# Patient Record
Sex: Female | Born: 1965 | Race: White | Hispanic: No | Marital: Married | Smoking: Never smoker
Health system: Southern US, Community
[De-identification: ages and names within clinical notes are randomized; demographics above are authoritative.]

## PROBLEM LIST (undated history)

## (undated) DIAGNOSIS — R911 Solitary pulmonary nodule: Secondary | ICD-10-CM

## (undated) DIAGNOSIS — R002 Palpitations: Secondary | ICD-10-CM

## (undated) DIAGNOSIS — F419 Anxiety disorder, unspecified: Secondary | ICD-10-CM

## (undated) DIAGNOSIS — R000 Tachycardia, unspecified: Secondary | ICD-10-CM

## (undated) DIAGNOSIS — N6019 Diffuse cystic mastopathy of unspecified breast: Secondary | ICD-10-CM

## (undated) DIAGNOSIS — R569 Unspecified convulsions: Secondary | ICD-10-CM

## (undated) DIAGNOSIS — R0602 Shortness of breath: Secondary | ICD-10-CM

## (undated) DIAGNOSIS — J302 Other seasonal allergic rhinitis: Secondary | ICD-10-CM

## (undated) DIAGNOSIS — M542 Cervicalgia: Secondary | ICD-10-CM

## (undated) DIAGNOSIS — M545 Low back pain, unspecified: Secondary | ICD-10-CM

## (undated) DIAGNOSIS — E785 Hyperlipidemia, unspecified: Secondary | ICD-10-CM

## (undated) DIAGNOSIS — K429 Umbilical hernia without obstruction or gangrene: Secondary | ICD-10-CM

## (undated) DIAGNOSIS — R109 Unspecified abdominal pain: Secondary | ICD-10-CM

## (undated) DIAGNOSIS — K219 Gastro-esophageal reflux disease without esophagitis: Secondary | ICD-10-CM

## (undated) DIAGNOSIS — K449 Diaphragmatic hernia without obstruction or gangrene: Secondary | ICD-10-CM

## (undated) DIAGNOSIS — R635 Abnormal weight gain: Secondary | ICD-10-CM

## (undated) DIAGNOSIS — R067 Sneezing: Secondary | ICD-10-CM

## (undated) DIAGNOSIS — D72819 Decreased white blood cell count, unspecified: Secondary | ICD-10-CM

## (undated) DIAGNOSIS — R1031 Right lower quadrant pain: Secondary | ICD-10-CM

## (undated) HISTORY — DX: Diaphragmatic hernia without obstruction or gangrene: K44.9

## (undated) HISTORY — DX: Anxiety disorder, unspecified: F41.9

## (undated) HISTORY — DX: Unspecified abdominal pain: R10.9

## (undated) HISTORY — DX: Umbilical hernia without obstruction or gangrene: K42.9

## (undated) HISTORY — PX: HYSTEROSCOPY DIAGNOSTIC: PRO49

## (undated) HISTORY — PX: COLONOSCOPY W/ POLYPECTOMY: SHX1380

## (undated) HISTORY — PX: BREAST SURGERY: SHX581

## (undated) HISTORY — DX: Shortness of breath: R06.02

## (undated) HISTORY — DX: Low back pain, unspecified: M54.50

## (undated) HISTORY — DX: Abnormal weight gain: R63.5

## (undated) HISTORY — DX: Other seasonal allergic rhinitis: J30.2

## (undated) HISTORY — PX: KNEE SURGERY: SHX244

## (undated) HISTORY — DX: Unspecified convulsions: R56.9

## (undated) HISTORY — DX: Solitary pulmonary nodule: R91.1

## (undated) HISTORY — PX: NECK SURGERY: SHX720

## (undated) HISTORY — DX: Cervicalgia: M54.2

## (undated) HISTORY — DX: Decreased white blood cell count, unspecified: D72.819

## (undated) HISTORY — DX: Sneezing: R06.7

## (undated) HISTORY — DX: Gastro-esophageal reflux disease without esophagitis: K21.9

## (undated) HISTORY — DX: Hyperlipidemia, unspecified: E78.5

## (undated) HISTORY — PX: JOINT REPLACEMENT: SHX530

## (undated) HISTORY — PX: BREAST BIOPSY: SHX20

## (undated) HISTORY — DX: Palpitations: R00.2

## (undated) HISTORY — PX: CHOLECYSTECTOMY: SHX55

## (undated) HISTORY — PX: ROBOTIC ASSISTED TOTAL HYSTERECTOMY: SHX6085

## (undated) HISTORY — PX: ABDOMINAL HYSTERECTOMY: SHX81

---
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
## 2005-07-16 ENCOUNTER — Ambulatory Visit (HOSPITAL_COMMUNITY): Admission: RE | Admit: 2005-07-16 | Discharge: 2005-07-17 | Payer: Self-pay | Admitting: Orthopaedic Surgery

## 2006-10-30 ENCOUNTER — Ambulatory Visit (HOSPITAL_COMMUNITY): Admission: RE | Admit: 2006-10-30 | Discharge: 2006-10-31 | Payer: Self-pay | Admitting: Orthopaedic Surgery

## 2019-03-31 ENCOUNTER — Telehealth: Payer: Self-pay | Admitting: *Deleted

## 2019-03-31 NOTE — Telephone Encounter (Signed)
    COVID-19 Pre-Screening Questions:  . In the past 7 to 10 days have you had a cough,  shortness of breath, headache, congestion, fever (100 or greater) body aches, chills, sore throat, or sudden loss of taste or sense of smell? . Have you been around anyone with known Covid 19. . Have you been around anyone who is awaiting Covid 19 test results in the past 7 to 10 days? . Have you been around anyone who has been exposed to Covid 19, or has mentioned symptoms of Covid 19 within the past 7 to 10 days?  Patient answered no to all the above questions and will call back if anything changes prior to her appointment. She is aware to arrive at 9:15 for a 9:30 appointment and was instructed to wear a mask.  She asked if her husband could join her for the appointment, I let her know unfortunately due to COVID that he would not be able to join her.  She understood and was appreciative of my call.

## 2019-04-07 ENCOUNTER — Encounter (INDEPENDENT_AMBULATORY_CARE_PROVIDER_SITE_OTHER): Payer: Self-pay

## 2019-04-07 ENCOUNTER — Other Ambulatory Visit: Payer: Self-pay

## 2019-04-07 ENCOUNTER — Ambulatory Visit (INDEPENDENT_AMBULATORY_CARE_PROVIDER_SITE_OTHER): Payer: Medicare Other | Admitting: Interventional Cardiology

## 2019-04-07 ENCOUNTER — Encounter: Payer: Self-pay | Admitting: Interventional Cardiology

## 2019-04-07 VITALS — BP 120/82 | HR 92 | Ht 65.0 in | Wt 163.8 lb

## 2019-04-07 DIAGNOSIS — R5383 Other fatigue: Secondary | ICD-10-CM

## 2019-04-07 DIAGNOSIS — R002 Palpitations: Secondary | ICD-10-CM

## 2019-04-07 DIAGNOSIS — R0602 Shortness of breath: Secondary | ICD-10-CM | POA: Diagnosis not present

## 2019-04-07 MED ORDER — METOPROLOL SUCCINATE ER 25 MG PO TB24: 25.0000 mg | ORAL_TABLET | Freq: Every day | ORAL | 3 refills | Status: DC

## 2019-04-07 NOTE — Progress Notes (Addendum)
Cardiology Office Note   Date:  04/07/2019   ID:  Diane Gutierrez, DOB October 22, 1966, MRN [id number]  PCP:  Osie Cheeks, PA-C    No chief complaint on file.  palpitations  Wt Readings from Last 3 Encounters:  04/07/19 163 lb 12.8 oz (74.3 kg)       History of Present Illness: Diane Gutierrez is a 53 y.o. female who is being seen today for the evaluation of palpitaitons at the request of No ref. provider found.  She had a hysterectomy in 2019, and developed palpitations after the surgery.  SHe had a monitor in 01/2018 showing only some Very rare PVCs.  Avg HR 88.   She was started on a beta blocker.    Echo in 2019 showed normal LV function.    She reports some orthopnea and dizziness when she sits up.   She has fatigue.  She has had chronic pain as well.   She admits to being "anxious and hyper."  She reports some occasional right leg swelling. She has had lymph nodes removed from her groins as well.   Brother died from an arrhythmia, in the setting of myocarditis presumably.  Both parents had heart attacks. Father with CABG in his 41s.       Past Medical History:  Diagnosis Date  . Abdominal pain   . Anxiety   . Hyperlipidemia   . Neck pain   . Palpitations   . Seasonal allergies   . Sneezing   . SOB (shortness of breath)   . Tachycardia   . Weight gain        Current Outpatient Medications  Medication Sig Dispense Refill  . acetaminophen (TYLENOL) 325 MG tablet Take 650 mg by mouth every 6 (six) hours as needed.    Marland Kitchen BIOTIN PO Take by mouth.    . Calcium Carb-Cholecalciferol (CALCIUM CARBONATE-VITAMIN D3 PO) Take by mouth.    . clonazePAM (KLONOPIN) 0.5 MG tablet Take 0.5 mg by mouth 2 (two) times daily as needed for anxiety.    . cyclobenzaprine (FLEXERIL) 10 MG tablet Take 10 mg by mouth daily.    . fexofenadine (ALLEGRA) 60 MG tablet Take 60 mg by mouth 2 (two) times daily.    Marland Kitchen gabapentin (NEURONTIN) 300 MG capsule Take 300 mg by mouth  daily.    Marland Kitchen KRILL OIL PO Take by mouth.    . Multiple Vitamin (MULTIVITAMIN) tablet Take 1 tablet by mouth daily.    Marland Kitchen omeprazole (PRILOSEC) 40 MG capsule Take 40 mg by mouth daily.    . promethazine (PHENERGAN) 12.5 MG tablet Take 12.5 mg by mouth every 6 (six) hours as needed for nausea or vomiting.    . metoprolol succinate (TOPROL-XL) 25 MG 24 hr tablet Take 1 tablet (25 mg total) by mouth daily. Take with or immediately following a meal. 90 tablet 3   No current facility-administered medications for this visit.     Allergies:   Ibuprofen and Prednisone    Social History:  The patient  reports that she has never smoked. She has never used smokeless tobacco.   Family History:  The patient's family history includes Heart disease in her father.    ROS:  Please see the history of present illness. Occasional right ankle swelling.  Otherwise, review of systems are positive for palpitations, fatigue.   All other systems are reviewed and negative.    PHYSICAL EXAM: VS:  BP 120/82   Pulse 92  Ht 5\' 5"  (1.651 m)   Wt 163 lb 12.8 oz (74.3 kg)   SpO2 98%   BMI 27.26 kg/m  , BMI Body mass index is 27.26 kg/m. GEN: Well nourished, well developed, in no acute distress  HEENT: normal  Neck: no JVD, carotid bruits, or masses Cardiac: RRR; no murmurs, rubs, or gallops,no edema  Respiratory:  clear to auscultation bilaterally, normal work of breathing GI: soft, nontender, nondistended, + BS MS: no deformity or atrophy  Skin: warm and dry, no rash Neuro:  Strength and sensation are intact Psych: euthymic mood, full affect   EKG:   The ekg ordered today demonstrates NSR , nonspecific ST changes   Recent Labs: No results found for requested labs within last 8760 hours.   Lipid Panel No results found for: CHOL, TRIG, HDL, CHOLHDL, VLDL, LDLCALC, LDLDIRECT   Other studies Reviewed: Additional studies/ records that were reviewed today with results demonstrating: echo and monitor  reviewed.   ASSESSMENT AND PLAN:  1. Palpitations: PVCs and intermittent sinus tach noted on prior monitor. No pathologic arrhythmias.  2. SHOB: Normal LV function and adequate valvular function by echo. 3. Decrease metoprolol to 25 mg daily.  Fatigue has been worse with the medicine.  May ultimately change to prn medicine.  Continue exercise.     Current medicines are reviewed at length with the patient today.  The patient concerns regarding her medicines were addressed.  The following changes have been made:  No change  Labs/ tests ordered today include:   Orders Placed This Encounter  Procedures  . EKG 12-Lead    Recommend 150 minutes/week of aerobic exercise Low fat, low carb, high fiber diet recommended  Disposition:   FU in 2 months with video visit.     Signed, Larae Grooms, MD  04/07/2019 5:20 PM    [facility] [place], [place], [place]  [address] Phone: [phone]; Fax: [phone]

## 2019-04-07 NOTE — Patient Instructions (Signed)
Medication Instructions:  Your physician has recommended you make the following change in your medication:   DECREASE: metoprolol succinate (Toprol-XL) to 25 mg once a day  Lab work: None Ordered  If you have labs (blood work) drawn today and your tests are completely normal, you will receive your results only by: Marland Kitchen MyChart Message (if you have MyChart) OR . A paper copy in the mail If you have any lab test that is abnormal or we need to change your treatment, we will call you to review the results.  Testing/Procedures: None ordered  Follow-Up: . Follow up with Dr. Irish Lack via [place] Visit in 2 months  Any Other Special Instructions Will Be Listed Below (If Applicable).

## 2019-04-08 ENCOUNTER — Telehealth: Payer: Self-pay

## 2019-04-08 NOTE — Telephone Encounter (Signed)
Called and made patient aware of recommendations. Patient wanting to let Dr. Irish Lack know that she read the OV note and saw that it was noted that her monitor was done in April 2020 but wanted to let him know that her monitor was from April 2019 and her echo was from May 2019.

## 2019-04-08 NOTE — Telephone Encounter (Signed)
Patient calling and states that her heart was racing earlier at 140 bpm on her fitbit. She states that when she is up walking around the house is when it happens. She states that she has some SOB with it. She started the lower dose of Toprol 25 mg this morning. She states that her HR is currently 128 on her fitbit. Instructed her to go ahead an take the other 25 mg. She denies caffeine, alcohol, or any diet pills. She states that she has been staying hydrated and has had plenty of water. Made patient aware that I will forward to Dr. Irish Lack for review and recommendation and I will call her back before end of day.

## 2019-04-08 NOTE — Telephone Encounter (Signed)
Agree with using the additional 25 mg of metoprolol prn.    Stick to metoprolol 25 mg daily scheduled as well, so max dose 50 mg in a day.

## 2019-04-13 NOTE — Telephone Encounter (Signed)
I corrected my note. Thanks.

## 2019-04-18 ENCOUNTER — Ambulatory Visit: Payer: Self-pay | Admitting: Cardiology

## 2019-06-15 ENCOUNTER — Telehealth: Payer: Self-pay | Admitting: Family Medicine

## 2019-06-15 NOTE — Telephone Encounter (Signed)
Pt is calling requesting new pt appt with Dr. Charlett Blake. She states that [employer] with [facility] hiring referred her to Dr. Charlett Blake and was sending a message to the practice administrator for her. Elias Else, a current pt with Dr. Charlett Blake, also referred her. Please advise.

## 2019-06-17 NOTE — Telephone Encounter (Signed)
Please schedule patient for a new patient appt

## 2019-06-17 NOTE — Telephone Encounter (Signed)
First available is 08/09/2019. She is requesting sooner as she has had kidney pain, stomach tension, weight gain, etc since having a hysterectomy last year. Pt states that she has a pinching feeling since then. Pt requesting appt sooner and can come any day or time if Dr. Charlett Blake will fit her in.

## 2019-06-17 NOTE — Telephone Encounter (Signed)
Yes am happy to see new patient

## 2019-06-17 NOTE — Telephone Encounter (Signed)
Please advise 

## 2019-06-21 NOTE — Progress Notes (Signed)
Virtual Visit via Video Note   This visit type was conducted due to national recommendations for restrictions regarding the COVID-19 Pandemic (e.g. social distancing) in an effort to limit this patient's exposure and mitigate transmission in our community.  Due to her co-morbid illnesses, this patient is at least at moderate risk for complications without adequate follow up.  This format is felt to be most appropriate for this patient at this time.  All issues noted in this document were discussed and addressed.  A limited physical exam was performed with this format.  Please refer to the patient's chart for her consent to telehealth for [facility].   Date:  06/22/2019   ID:  Diane Gutierrez, DOB Mar 05, 1966, MRN [id number]  Patient Location: Home Provider Location: Office  PCP:  Mosie Lukes, MD  Cardiologist:  No primary care provider on file. [place] Electrophysiologist:  None   Evaluation Performed:  Follow-Up Visit  Chief Complaint:  Palpitations  History of Present Illness:    Diane Gutierrez is a 53 y.o. female  who was seen in 03/2019 for the evaluation of palpitaitons at the request of Dorothy Puffer, PA-C.  Prior records showed: "She had a hysterectomy in 2019, and developed palpitations after the surgery.  SHe had a monitor in 01/2018 showing only some Very rare PVCs.  Avg HR 88.   She was started on a beta blocker.    Echo in 2019 showed normal LV function.    She reports some orthopnea and dizziness when she sits up.   She has fatigue.  She has had chronic pain as well.   She admits to being "anxious and hyper."  She reports some occasional right leg swelling. She has had lymph nodes removed from her groins as well.   Brother died from an arrhythmia, in the setting of myocarditis presumably.  Both parents had heart attacks. Father with CABG in his 44s."  Plan in MAy 2020: 1. "Palpitations: PVCs and intermittent sinus tach noted on prior monitor. No  pathologic arrhythmias.  2. SHOB: Normal LV function and adequate valvular function by echo. 3. Decrease metoprolol to 25 mg daily.  Fatigue has been worse with the medicine.  May ultimately change to prn medicine.  Continue exercise. Palpitations: PVCs and intermittent sinus tach noted on prior monitor. No pathologic arrhythmias.  4. SHOB: Normal LV function and adequate valvular function by echo. Decrease metoprolol to 25 mg daily.  Fatigue has been worse with the medicine.  May ultimately change to prn medicine.  Continue exercise. "  Since the last visit, she has done well.  She has some palpitations.  She tries to walk and use the elliptical.  HR increases quickly.    Denies :Dizziness. Leg edema. Nitroglycerin use. Orthopnea.  Paroxysmal nocturnal dyspnea. Shortness of breath. Syncope.   Metoprolol was decreased to once a day.  She feels that the palpitations are more frequent.     The patient does not have symptoms concerning for COVID-19 infection (fever, chills, cough, or new shortness of breath).    Past Medical History:  Diagnosis Date  . Abdominal pain   . Anxiety   . Hyperlipidemia   . Neck pain   . Palpitations   . Seasonal allergies   . Sneezing   . SOB (shortness of breath)   . Tachycardia   . Weight gain       Current Meds  Medication Sig  . acetaminophen (TYLENOL) 325 MG tablet Take 650 mg by  mouth every 6 (six) hours as needed.  Marland Kitchen BIOTIN PO Take by mouth.  . Calcium Carb-Cholecalciferol (CALCIUM CARBONATE-VITAMIN D3 PO) Take by mouth.  . clonazePAM (KLONOPIN) 0.5 MG tablet Take 0.5 mg by mouth 2 (two) times daily as needed for anxiety.  . cyclobenzaprine (FLEXERIL) 10 MG tablet Take 10 mg by mouth daily.  Marland Kitchen gabapentin (NEURONTIN) 300 MG capsule Take 300 mg by mouth daily.  . metoprolol succinate (TOPROL-XL) 25 MG 24 hr tablet Take 1 tablet (25 mg total) by mouth daily. Take with or immediately following a meal.  . Multiple Vitamin (MULTIVITAMIN) tablet Take  1 tablet by mouth daily.  . Omega-3 Fatty Acids (FISH OIL) 1000 MG CAPS Take 1,000 mg by mouth daily.  Marland Kitchen omeprazole (PRILOSEC) 40 MG capsule Take 40 mg by mouth daily.  . promethazine (PHENERGAN) 12.5 MG tablet Take 12.5 mg by mouth every 6 (six) hours as needed for nausea or vomiting.     Allergies:   Ibuprofen and Prednisone   Social History   Tobacco Use  . Smoking status: Never Smoker  . Smokeless tobacco: Never Used  Substance Use Topics  . Alcohol use: Not on file  . Drug use: Not on file     Family Hx: The patient's family history includes Heart disease in her father.  ROS:   Please see the history of present illness.    Continued palpitations All other systems reviewed and are negative.   Prior CV studies:   The following studies were reviewed today:  Normal EF by echo in 2019  Labs/Other Tests and Data Reviewed:    EKG:  An ECG dated 03/2019 was personally reviewed today and demonstrated:  NSR, normal ECG  Recent Labs: No results found for requested labs within last 8760 hours.   Recent Lipid Panel No results found for: CHOL, TRIG, HDL, CHOLHDL, LDLCALC, LDLDIRECT  Wt Readings from Last 3 Encounters:  06/22/19 164 lb (74.4 kg)  04/07/19 163 lb 12.8 oz (74.3 kg)     Objective:    Vital Signs:  BP 111/86   Pulse (!) 105   Ht 5\' 5"  (1.651 m)   Wt 164 lb (74.4 kg)   BMI 27.29 kg/m    VITAL SIGNS:  reviewed GEN:  no acute distress RESPIRATORY:  normal respiratory effort, symmetric expansion NEURO:  alert and oriented x 3, no obvious focal deficit PSYCH:  normal affect exam limited by video format  ASSESSMENT & PLAN:    1. Palpitations: PVC and intermittent sinus tach noted on prior monitor.  More palpitations, will increase metoprolol to 25 mg BID.  2. Fatigue:  Improved. 3. Chest pain: Some atypical chest pains, but not related to exertion.    COVID-19 Education: The signs and symptoms of COVID-19 were discussed with the patient and how to  seek care for testing (follow up with PCP or arrange E-visit).  The importance of social distancing was discussed today.  Time:   Today, I have spent 25 minutes with the patient with telehealth technology discussing the above problems.     Medication Adjustments/Labs and Tests Ordered: Current medicines are reviewed at length with the patient today.  Concerns regarding medicines are outlined above.   Tests Ordered: No orders of the defined types were placed in this encounter.   Medication Changes: No orders of the defined types were placed in this encounter.   Follow Up:  Virtual Visit or In Person in 3 month(s)  Signed, Larae Grooms, MD  06/22/2019 1:50  PM    [facility]

## 2019-06-22 ENCOUNTER — Encounter: Payer: Self-pay | Admitting: Interventional Cardiology

## 2019-06-22 ENCOUNTER — Other Ambulatory Visit: Payer: Self-pay

## 2019-06-22 ENCOUNTER — Telehealth (INDEPENDENT_AMBULATORY_CARE_PROVIDER_SITE_OTHER): Payer: Medicare Other | Admitting: Interventional Cardiology

## 2019-06-22 VITALS — BP 111/86 | HR 105 | Ht 65.0 in | Wt 164.0 lb

## 2019-06-22 DIAGNOSIS — R5383 Other fatigue: Secondary | ICD-10-CM

## 2019-06-22 DIAGNOSIS — I493 Ventricular premature depolarization: Secondary | ICD-10-CM | POA: Insufficient documentation

## 2019-06-22 DIAGNOSIS — R002 Palpitations: Secondary | ICD-10-CM | POA: Diagnosis not present

## 2019-06-22 HISTORY — DX: Ventricular premature depolarization: I49.3

## 2019-06-22 MED ORDER — METOPROLOL SUCCINATE ER 25 MG PO TB24: 25.0000 mg | ORAL_TABLET | Freq: Two times a day (BID) | ORAL | 3 refills | Status: DC

## 2019-06-22 NOTE — Patient Instructions (Signed)
Medication Instructions:  Your physician has recommended you make the following change in your medication:   INCREASE: metoprolol succinate (Toprol-XL) to 25 mg TWICE a day  Lab work: None Ordered  If you have labs (blood work) drawn today and your tests are completely normal, you will receive your results only by: Marland Kitchen MyChart Message (if you have MyChart) OR . A paper copy in the mail If you have any lab test that is abnormal or we need to change your treatment, we will call you to review the results.  Testing/Procedures: None ordered  Follow-Up: . Follow up with Dr. Irish Lack via [place] Visit in 3 months  Any Other Special Instructions Will Be Listed Below (If Applicable).

## 2019-06-29 ENCOUNTER — Telehealth: Payer: Self-pay

## 2019-06-29 NOTE — Telephone Encounter (Signed)
Copied from [place] [id number]. Topic: Appointment Scheduling - Scheduling Inquiry for Clinic >> Jun 14, 2019  2:02 PM Sheran Luz wrote: Patient states she was approved to see Dr. Charlett Blake by Enriqueta Shutter. Please contact patient for scheduling.

## 2019-07-01 NOTE — Telephone Encounter (Signed)
Patient scheduled for 07/07/19

## 2019-07-04 ENCOUNTER — Other Ambulatory Visit: Payer: Self-pay

## 2019-07-05 ENCOUNTER — Telehealth: Payer: Medicare Other | Admitting: Interventional Cardiology

## 2019-07-07 ENCOUNTER — Encounter: Payer: Self-pay | Admitting: Family Medicine

## 2019-07-07 ENCOUNTER — Other Ambulatory Visit: Payer: Self-pay

## 2019-07-07 ENCOUNTER — Ambulatory Visit (INDEPENDENT_AMBULATORY_CARE_PROVIDER_SITE_OTHER): Payer: Medicare Other | Admitting: Family Medicine

## 2019-07-07 ENCOUNTER — Telehealth: Payer: Self-pay | Admitting: Family Medicine

## 2019-07-07 VITALS — BP 108/72 | HR 104 | Temp 97.7°F | Resp 18 | Ht 65.0 in | Wt 165.0 lb

## 2019-07-07 DIAGNOSIS — R1011 Right upper quadrant pain: Secondary | ICD-10-CM

## 2019-07-07 DIAGNOSIS — E785 Hyperlipidemia, unspecified: Secondary | ICD-10-CM | POA: Diagnosis not present

## 2019-07-07 DIAGNOSIS — M545 Low back pain, unspecified: Secondary | ICD-10-CM

## 2019-07-07 DIAGNOSIS — F419 Anxiety disorder, unspecified: Secondary | ICD-10-CM | POA: Diagnosis not present

## 2019-07-07 DIAGNOSIS — R1031 Right lower quadrant pain: Secondary | ICD-10-CM

## 2019-07-07 DIAGNOSIS — K589 Irritable bowel syndrome without diarrhea: Secondary | ICD-10-CM | POA: Insufficient documentation

## 2019-07-07 DIAGNOSIS — R000 Tachycardia, unspecified: Secondary | ICD-10-CM

## 2019-07-07 DIAGNOSIS — Z1239 Encounter for other screening for malignant neoplasm of breast: Secondary | ICD-10-CM

## 2019-07-07 DIAGNOSIS — J302 Other seasonal allergic rhinitis: Secondary | ICD-10-CM | POA: Diagnosis not present

## 2019-07-07 DIAGNOSIS — K219 Gastro-esophageal reflux disease without esophagitis: Secondary | ICD-10-CM

## 2019-07-07 DIAGNOSIS — N6019 Diffuse cystic mastopathy of unspecified breast: Secondary | ICD-10-CM

## 2019-07-07 DIAGNOSIS — M542 Cervicalgia: Secondary | ICD-10-CM

## 2019-07-07 HISTORY — DX: Tachycardia, unspecified: R00.0

## 2019-07-07 HISTORY — DX: Right lower quadrant pain: R10.31

## 2019-07-07 LAB — LIPID PANEL
Cholesterol: 218 mg/dL — ABNORMAL HIGH (ref 0–200)
HDL: 37.6 mg/dL — ABNORMAL LOW (ref >39.00)
LDL Cholesterol: 142 mg/dL — ABNORMAL HIGH (ref 0–99)
NonHDL: 179.97
Total CHOL/HDL Ratio: 6
Triglycerides: 190 mg/dL — ABNORMAL HIGH (ref 0.0–149.0)
VLDL: 38 mg/dL (ref 0.0–40.0)

## 2019-07-07 LAB — COMPREHENSIVE METABOLIC PANEL
ALT: 34 U/L (ref 0–35)
AST: 29 U/L (ref 0–37)
Albumin: 4.2 g/dL (ref 3.5–5.2)
Alkaline Phosphatase: 83 U/L (ref 39–117)
BUN: 9 mg/dL (ref 6–23)
CO2: 30 mEq/L (ref 19–32)
Calcium: 9.4 mg/dL (ref 8.4–10.5)
Chloride: 104 mEq/L (ref 96–112)
Creatinine, Ser: 0.85 mg/dL (ref 0.40–1.20)
GFR: 69.88 mL/min (ref >60.00)
Glucose, Bld: 84 mg/dL (ref 70–99)
Potassium: 4.2 mEq/L (ref 3.5–5.1)
Sodium: 142 mEq/L (ref 135–145)
Total Bilirubin: 0.5 mg/dL (ref 0.2–1.2)
Total Protein: 6.8 g/dL (ref 6.0–8.3)

## 2019-07-07 LAB — CBC
HCT: 40.4 % (ref 36.0–46.0)
Hemoglobin: 13.8 g/dL (ref 12.0–15.0)
MCHC: 34.2 g/dL (ref 30.0–36.0)
MCV: 89.4 fl (ref 78.0–100.0)
Platelets: 256 10*3/uL (ref 150.0–400.0)
RBC: 4.52 Mil/uL (ref 3.87–5.11)
RDW: 13.2 % (ref 11.5–15.5)
WBC: 5.8 10*3/uL (ref 4.0–10.5)

## 2019-07-07 LAB — TSH: TSH: 2.82 u[IU]/mL (ref 0.35–4.50)

## 2019-07-07 MED ORDER — HYOSCYAMINE SULFATE 0.125 MG SL SUBL: 0.1250 mg | SUBLINGUAL_TABLET | SUBLINGUAL | 1 refills | Status: DC | PRN

## 2019-07-07 NOTE — Patient Instructions (Signed)

## 2019-07-07 NOTE — Telephone Encounter (Signed)
After her visit today. Patient mention that Dr. B willing to take her parents on as new patients. Will call back to schedule

## 2019-07-07 NOTE — Assessment & Plan Note (Addendum)
Improved on recheck and tolerating metoprolol

## 2019-07-07 NOTE — Assessment & Plan Note (Addendum)
Intermittent unrelated to bowel movements or po intake. Given Hyoscyamiine to try prn

## 2019-07-10 ENCOUNTER — Encounter: Payer: Self-pay | Admitting: Family Medicine

## 2019-07-10 DIAGNOSIS — N6019 Diffuse cystic mastopathy of unspecified breast: Secondary | ICD-10-CM

## 2019-07-10 HISTORY — DX: Diffuse cystic mastopathy of unspecified breast: N60.19

## 2019-07-10 NOTE — Progress Notes (Signed)
Subjective:    Patient ID: Diane Gutierrez, female    DOB: 1966-06-17, 53 y.o.   MRN: [id number]  No chief complaint on file.   HPI Patient is in today for new patient appointment. No recent febrile illness or hospitalizations. She is sad that she recently lost her 50 year old brother to heart disease, he had a smoking and sleep apnea. She herself is struggling with left sided low back pain. No recent fall or injury. No radicular symptoms. When the pain is bad she uses Flexeril with adequate relief. She does note some palpitations at times but she does not have any associated symptoms at the time of the palpitations. She last saw gastroenterology in 2018 when she had a colonoscopy but she continues to have intermittent abdominal pain most notably in the right lower quadrant. No bloody or tarry stool. She notes she did suffer a 4th degree tear with the birth of her last child. She follows with gynecology at [facility] with dr Earleen Newport. Denies CP/palp/SOB/HA/congestion/fevers or GU c/o. Taking meds as prescribed   Past Medical History:  Diagnosis Date  . Abdominal pain   . Acid reflux   . Anxiety   . Colicky RLQ abdominal pain 07/07/2019  . Fibrocystic breast 07/10/2019  . Hyperlipidemia   . Leukopenia   . Low back pain   . Neck pain   . Palpitations   . Seasonal allergies   . Seizure ([place])    childhood  . Sneezing   . SOB (shortness of breath)   . Tachycardia   . Tachycardia 07/07/2019  . Weight gain       Family History  Problem Relation Age of Onset  . Heart disease Father        s/p CABG  . Diabetes Father   . Hyperlipidemia Father   . Hypertension Father   . Other Father   . Bipolar disorder Father   . Heart disease Brother   . Sleep apnea Brother   . Arthritis Mother   . Hearing loss Mother   . Heart disease Mother        MI  . Hyperlipidemia Mother   . Thyroid cancer Mother   . Drug abuse Sister        crack cocaine  . Other Sister   . Diabetes  Sister        smoker  . Arthritis Maternal Grandmother   . Heart disease Maternal Grandmother   . Arthritis Maternal Grandfather   . Heart disease Maternal Grandfather   . Heart disease Paternal Grandmother   . Cancer Paternal Grandfather   . Heart disease Paternal Grandfather     Social History   Socioeconomic History  . Marital status: Married    Spouse name: Not on file  . Number of children: Not on file  . Years of education: Not on file  . Highest education level: Not on file  Occupational History  . Not on file  Social Needs  . Financial resource strain: Not on file  . Food insecurity    Worry: Not on file    Inability: Not on file  . Transportation needs    Medical: Not on file    Non-medical: Not on file  Tobacco Use  . Smoking status: Never Smoker  . Smokeless tobacco: Never Used  Substance and Sexual Activity  . Alcohol use: Not Currently  . Drug use: Never  . Sexual activity: Not on file  Lifestyle  . Physical  activity    Days per week: Not on file    Minutes per session: Not on file  . Stress: Not on file  Relationships  . Social Herbalist on phone: Not on file    Gets together: Not on file    Attends religious service: Not on file    Active member of club or organization: Not on file    Attends meetings of clubs or organizations: Not on file    Relationship status: Not on file  . Intimate partner violence    Fear of current or ex partner: Not on file    Emotionally abused: Not on file    Physically abused: Not on file    Forced sexual activity: Not on file  Other Topics Concern  . Not on file  Social History Narrative   Lives with husband is on disability, previously worked for [employer], no dietary restrictions.     Outpatient Medications Prior to Visit  Medication Sig Dispense Refill  . acetaminophen (TYLENOL) 325 MG tablet Take 650 mg by mouth every 6 (six) hours as needed.    Marland Kitchen BIOTIN PO Take by mouth.    . Calcium  Carb-Cholecalciferol (CALCIUM CARBONATE-VITAMIN D3 PO) Take by mouth.    . clonazePAM (KLONOPIN) 0.5 MG tablet Take 0.5 mg by mouth 2 (two) times daily as needed for anxiety.    . cyclobenzaprine (FLEXERIL) 10 MG tablet Take 10 mg by mouth daily.    Marland Kitchen gabapentin (NEURONTIN) 300 MG capsule Take 300 mg by mouth daily.    . metoprolol succinate (TOPROL-XL) 25 MG 24 hr tablet Take 1 tablet (25 mg total) by mouth 2 (two) times daily. Take with or immediately following a meal. 180 tablet 3  . Multiple Vitamin (MULTIVITAMIN) tablet Take 1 tablet by mouth daily.    . Omega-3 Fatty Acids (FISH OIL) 1000 MG CAPS Take 1,000 mg by mouth daily.    Marland Kitchen omeprazole (PRILOSEC) 40 MG capsule Take 40 mg by mouth daily.    . promethazine (PHENERGAN) 12.5 MG tablet Take 12.5 mg by mouth every 6 (six) hours as needed for nausea or vomiting.     No facility-administered medications prior to visit.     Allergies  Allergen Reactions  . Prednisone Rash    RASH, severe  . Ibuprofen     GI UPSET    Review of Systems  Constitutional: Negative for chills, fever and malaise/fatigue.  HENT: Negative for congestion and hearing loss.   Eyes: Negative for discharge.  Respiratory: Negative for cough, sputum production and shortness of breath.   Cardiovascular: Positive for palpitations. Negative for chest pain and leg swelling.  Gastrointestinal: Positive for heartburn. Negative for abdominal pain, blood in stool, constipation, diarrhea, nausea and vomiting.  Genitourinary: Negative for dysuria, frequency, hematuria and urgency.  Musculoskeletal: Negative for back pain, falls and myalgias.  Skin: Negative for rash.  Neurological: Negative for dizziness, sensory change, loss of consciousness, weakness and headaches.  Endo/Heme/Allergies: Negative for environmental allergies. Does not bruise/bleed easily.  Psychiatric/Behavioral: Negative for depression and suicidal ideas. The patient is nervous/anxious. The patient does  not have insomnia.        Objective:    Physical Exam Constitutional:      General: She is not in acute distress.    Appearance: She is not diaphoretic.  HENT:     Head: Normocephalic and atraumatic.     Right Ear: External ear normal.     Left Ear: External  ear normal.     Nose: Nose normal.     Mouth/Throat:     Pharynx: No oropharyngeal exudate.  Eyes:     General: No scleral icterus.       Right eye: No discharge.        Left eye: No discharge.     Conjunctiva/sclera: Conjunctivae normal.     Pupils: Pupils are equal, round, and reactive to light.  Neck:     Musculoskeletal: Normal range of motion and neck supple.     Thyroid: No thyromegaly.  Cardiovascular:     Rate and Rhythm: Normal rate and regular rhythm.     Heart sounds: Normal heart sounds. No murmur.  Pulmonary:     Effort: Pulmonary effort is normal. No respiratory distress.     Breath sounds: Normal breath sounds. No wheezing or rales.  Abdominal:     General: Bowel sounds are normal. There is no distension.     Palpations: Abdomen is soft. There is no mass.     Tenderness: There is no abdominal tenderness.  Musculoskeletal: Normal range of motion.        General: No tenderness.  Lymphadenopathy:     Cervical: No cervical adenopathy.  Skin:    General: Skin is warm and dry.     Findings: No rash.  Neurological:     Mental Status: She is alert and oriented to person, place, and time.     Cranial Nerves: No cranial nerve deficit.     Coordination: Coordination normal.     Deep Tendon Reflexes: Reflexes are normal and symmetric. Reflexes normal.     BP 108/72 (BP Location: Left Arm, Patient Position: Sitting, Cuff Size: Normal)   Pulse (!) 104   Temp 97.7 F (36.5 C) (Temporal)   Resp 18   Ht 5\' 5"  (1.651 m)   Wt 165 lb (74.8 kg)   SpO2 98%   BMI 27.46 kg/m  Wt Readings from Last 3 Encounters:  07/07/19 165 lb (74.8 kg)  06/22/19 164 lb (74.4 kg)  04/07/19 163 lb 12.8 oz (74.3 kg)     Diabetic Foot Exam - Simple   No data filed     Lab Results  Component Value Date   WBC 5.8 07/07/2019   HGB 13.8 07/07/2019   HCT 40.4 07/07/2019   PLT 256.0 07/07/2019   GLUCOSE 84 07/07/2019   CHOL 218 (H) 07/07/2019   TRIG 190.0 (H) 07/07/2019   HDL 37.60 (L) 07/07/2019   LDLCALC 142 (H) 07/07/2019   ALT 34 07/07/2019   AST 29 07/07/2019   NA 142 07/07/2019   K 4.2 07/07/2019   CL 104 07/07/2019   CREATININE 0.85 07/07/2019   BUN 9 07/07/2019   CO2 30 07/07/2019   TSH 2.82 07/07/2019    Lab Results  Component Value Date   TSH 2.82 07/07/2019   Lab Results  Component Value Date   WBC 5.8 07/07/2019   HGB 13.8 07/07/2019   HCT 40.4 07/07/2019   MCV 89.4 07/07/2019   PLT 256.0 07/07/2019   Lab Results  Component Value Date   NA 142 07/07/2019   K 4.2 07/07/2019   CO2 30 07/07/2019   GLUCOSE 84 07/07/2019   BUN 9 07/07/2019   CREATININE 0.85 07/07/2019   BILITOT 0.5 07/07/2019   ALKPHOS 83 07/07/2019   AST 29 07/07/2019   ALT 34 07/07/2019   PROT 6.8 07/07/2019   ALBUMIN 4.2 07/07/2019   CALCIUM 9.4 07/07/2019   GFR  69.88 07/07/2019   Lab Results  Component Value Date   CHOL 218 (H) 07/07/2019   Lab Results  Component Value Date   HDL 37.60 (L) 07/07/2019   Lab Results  Component Value Date   LDLCALC 142 (H) 07/07/2019   Lab Results  Component Value Date   TRIG 190.0 (H) 07/07/2019   Lab Results  Component Value Date   CHOLHDL 6 07/07/2019   No results found for: HGBA1C     Assessment & Plan:   Problem List Items Addressed This Visit    Tachycardia    Improved on recheck and tolerating metoprolol      Relevant Orders   CBC (Completed)   Comprehensive metabolic panel (Completed)   TSH (Completed)   Anxiety    Using Clonazepam prn with adequate results. No changes today      Hyperlipidemia    Encouraged heart healthy diet, increase exercise, avoid trans fats, consider a krill oil cap daily      Relevant Orders   Lipid  panel (Completed)   TSH (Completed)   Seasonal allergies   Neck pain   Low back pain    Has intermittent left sided low back pain. Encouraged moist heat and gentle stretching as tolerated. May try NSAIDs and prescription meds as directed and report if symptoms worsen or seek immediate care. Flexeril is helpful.       Acid reflux    Avoid offending foods, start probiotics. Do not eat large meals in late evening and consider raising head of bed. Omeprazole      Relevant Medications   hyoscyamine (LEVSIN SL) 0.125 MG SL tablet   Colicky RLQ abdominal pain    Intermittent unrelated to bowel movements or po intake. Given Hyoscyamiine to try prn      Relevant Orders   US Abdomen Complete   Fibrocystic breast    Has undergone a left breast lumpectomy with a clip left in place after a cyst was drained. MGM ordered       Other Visit Diagnoses    Breast cancer screening    -  Primary   Relevant Orders   MM 3D SCREEN BREAST BILATERAL   RUQ abdominal pain       Relevant Orders   US Abdomen Complete      I am having Diane L. Bostic "Kim" start on hyoscyamine. I am also having her maintain her BIOTIN PO, Calcium Carb-Cholecalciferol (CALCIUM CARBONATE-VITAMIN D3 PO), clonazePAM, cyclobenzaprine, gabapentin, multivitamin, omeprazole, promethazine, acetaminophen, Fish Oil, and metoprolol succinate.  Meds ordered this encounter  Medications  . hyoscyamine (LEVSIN SL) 0.125 MG SL tablet    Sig: Place 1 tablet (0.125 mg total) under the tongue every 4 (four) hours as needed.    Dispense:  30 tablet    Refill:  1     Penni Homans, MD

## 2019-07-10 NOTE — Assessment & Plan Note (Addendum)
Has undergone a left breast lumpectomy with a clip left in place after a cyst was drained. MGM ordered

## 2019-07-10 NOTE — Assessment & Plan Note (Signed)
Using Clonazepam prn with adequate results. No changes today

## 2019-07-10 NOTE — Assessment & Plan Note (Signed)
Has intermittent left sided low back pain. Encouraged moist heat and gentle stretching as tolerated. May try NSAIDs and prescription meds as directed and report if symptoms worsen or seek immediate care. Flexeril is helpful.

## 2019-07-10 NOTE — Assessment & Plan Note (Signed)
Encouraged heart healthy diet, increase exercise, avoid trans fats, consider a krill oil cap daily 

## 2019-07-10 NOTE — Assessment & Plan Note (Signed)
Avoid offending foods, start probiotics. Do not eat large meals in late evening and consider raising head of bed. Omeprazole

## 2019-07-11 ENCOUNTER — Encounter: Payer: Self-pay | Admitting: Family Medicine

## 2019-07-21 ENCOUNTER — Encounter: Payer: Self-pay | Admitting: General Practice

## 2019-07-21 ENCOUNTER — Ambulatory Visit (HOSPITAL_BASED_OUTPATIENT_CLINIC_OR_DEPARTMENT_OTHER)
Admission: RE | Admit: 2019-07-21 | Discharge: 2019-07-21 | Disposition: A | Discharge status: Home / Self Care | Payer: Medicare Other | Source: Ambulatory Visit | Attending: Family Medicine | Admitting: Family Medicine

## 2019-07-21 ENCOUNTER — Other Ambulatory Visit: Payer: Self-pay | Admitting: General Practice

## 2019-07-21 ENCOUNTER — Other Ambulatory Visit: Payer: Self-pay

## 2019-07-21 DIAGNOSIS — R1031 Right lower quadrant pain: Secondary | ICD-10-CM

## 2019-07-21 DIAGNOSIS — R1011 Right upper quadrant pain: Secondary | ICD-10-CM | POA: Diagnosis present

## 2019-07-21 NOTE — Telephone Encounter (Signed)
Abstracted

## 2019-07-28 ENCOUNTER — Other Ambulatory Visit: Payer: Self-pay | Admitting: Family Medicine

## 2019-07-28 NOTE — Addendum Note (Signed)
Addended by: Dimple Nanas on: 07/28/2019 02:49 PM   Modules accepted: Orders

## 2019-07-28 NOTE — Telephone Encounter (Signed)
gabapentin (NEURONTIN) 300 MG capsule  cyclobenzaprine (FLEXERIL) 10 MG tablet    3 months supply  Send to [facility]

## 2019-07-28 NOTE — Telephone Encounter (Signed)
Requested medication (s) are due for refill today: no  Requested medication (s) are on the active medication list: no   Last refill:  Last filled by historical provider  Future visit scheduled: yes  Notes to clinic:  Unable to refill per protocol. Medication last filled by historical provider. Pt requesting a 3 month supply of listed medications    Requested Prescriptions  Pending Prescriptions Disp Refills   gabapentin (NEURONTIN) 300 MG capsule 90 capsule 0    Sig: Take 1 capsule (300 mg total) by mouth daily.     Neurology: Anticonvulsants - gabapentin Passed - 07/28/2019  2:49 PM      Passed - Valid encounter within last 12 months    Recent Outpatient Visits          3 weeks ago Breast cancer screening   [facility] at [place], MD      Future Appointments            In 2 months Mosie Lukes, MD [place] [facility] [place], [place]   In 2 months [place], Charlann Lange, MD [facility], [facility]            cyclobenzaprine (FLEXERIL) 10 MG tablet 30 tablet 0    Sig: Take 1 tablet (10 mg total) by mouth daily.     Not Delegated - Analgesics:  Muscle Relaxants Failed - 07/28/2019  2:49 PM      Failed - This refill cannot be delegated      Passed - Valid encounter within last 6 months    Recent Outpatient Visits          3 weeks ago Breast cancer screening   [facility]ivist at [place], MD      Future Appointments            In 2 months Mosie Lukes, MD [place] at [place], [place]   In 2 months [place], Charlann Lange, MD [place], [facility]

## 2019-07-29 MED ORDER — GABAPENTIN 300 MG PO CAPS: 300.0000 mg | ORAL_CAPSULE | Freq: Every day | ORAL | 1 refills | Status: DC

## 2019-07-29 MED ORDER — CYCLOBENZAPRINE HCL 10 MG PO TABS: 10.0000 mg | ORAL_TABLET | Freq: Every day | ORAL | 1 refills | Status: DC

## 2019-07-29 NOTE — Addendum Note (Signed)
Addended by: Wynonia Musty A on: 07/29/2019 08:39 AM   Modules accepted: Orders

## 2019-08-05 NOTE — Telephone Encounter (Signed)
Pt calling and stating her cyclobenzaprine is suppose to be 90 day supply and when she went and got it 30 days was what she got

## 2019-08-05 NOTE — Telephone Encounter (Signed)
Never has been filled by [place] in the system as a 90 day supply,  Dr. Charlett Blake please advise are you willing to do a 90 day supply of the Flexeril

## 2019-08-07 NOTE — Telephone Encounter (Signed)
I am willing to prescribe but we do not usually do a 3 month supply because this is a prn med. See how many she usually takes or how many she thinks she needs. What she uses it for the most

## 2019-08-09 ENCOUNTER — Ambulatory Visit: Payer: Medicare Other | Admitting: Family Medicine

## 2019-08-10 NOTE — Telephone Encounter (Signed)
Called patient left message for patient to call the office back  

## 2019-08-18 NOTE — Telephone Encounter (Signed)
We need to do a virtual visit to discuss options.

## 2019-08-18 NOTE — Telephone Encounter (Signed)
Spoke with patient she stated she states she has had neck surgery and has plates and screws she states she takes one at night for the pain.  She states she also wants to come off of them and was wondering how does she go about doing that.  Please advise

## 2019-08-19 ENCOUNTER — Ambulatory Visit
Admission: RE | Admit: 2019-08-19 | Discharge: 2019-08-19 | Disposition: A | Discharge status: Home / Self Care | Payer: Medicare Other | Source: Ambulatory Visit | Attending: Family Medicine | Admitting: Family Medicine

## 2019-08-19 ENCOUNTER — Other Ambulatory Visit: Payer: Self-pay

## 2019-08-19 DIAGNOSIS — Z1239 Encounter for other screening for malignant neoplasm of breast: Secondary | ICD-10-CM

## 2019-08-23 ENCOUNTER — Other Ambulatory Visit: Payer: Self-pay | Admitting: Family Medicine

## 2019-08-23 ENCOUNTER — Encounter: Payer: Self-pay | Admitting: Family Medicine

## 2019-08-23 DIAGNOSIS — R928 Other abnormal and inconclusive findings on diagnostic imaging of breast: Secondary | ICD-10-CM

## 2019-08-30 ENCOUNTER — Other Ambulatory Visit: Payer: Self-pay

## 2019-08-30 ENCOUNTER — Ambulatory Visit
Admission: RE | Admit: 2019-08-30 | Discharge: 2019-08-30 | Disposition: A | Discharge status: Home / Self Care | Payer: Medicare Other | Source: Ambulatory Visit | Attending: Family Medicine | Admitting: Family Medicine

## 2019-08-30 DIAGNOSIS — R928 Other abnormal and inconclusive findings on diagnostic imaging of breast: Secondary | ICD-10-CM

## 2019-10-04 ENCOUNTER — Other Ambulatory Visit: Payer: Self-pay

## 2019-10-04 ENCOUNTER — Encounter: Payer: Self-pay | Admitting: Family Medicine

## 2019-10-04 ENCOUNTER — Ambulatory Visit (INDEPENDENT_AMBULATORY_CARE_PROVIDER_SITE_OTHER): Payer: Medicare Other | Admitting: Family Medicine

## 2019-10-04 VITALS — BP 128/98 | HR 106 | Wt 157.8 lb

## 2019-10-04 DIAGNOSIS — R194 Change in bowel habit: Secondary | ICD-10-CM

## 2019-10-04 DIAGNOSIS — M542 Cervicalgia: Secondary | ICD-10-CM

## 2019-10-04 DIAGNOSIS — R000 Tachycardia, unspecified: Secondary | ICD-10-CM

## 2019-10-04 DIAGNOSIS — R1031 Right lower quadrant pain: Secondary | ICD-10-CM | POA: Diagnosis not present

## 2019-10-04 DIAGNOSIS — R1310 Dysphagia, unspecified: Secondary | ICD-10-CM | POA: Diagnosis not present

## 2019-10-04 DIAGNOSIS — K589 Irritable bowel syndrome without diarrhea: Secondary | ICD-10-CM

## 2019-10-04 DIAGNOSIS — K581 Irritable bowel syndrome with constipation: Secondary | ICD-10-CM

## 2019-10-04 DIAGNOSIS — K921 Melena: Secondary | ICD-10-CM

## 2019-10-04 DIAGNOSIS — K219 Gastro-esophageal reflux disease without esophagitis: Secondary | ICD-10-CM

## 2019-10-04 DIAGNOSIS — E785 Hyperlipidemia, unspecified: Secondary | ICD-10-CM

## 2019-10-04 MED ORDER — CYCLOBENZAPRINE HCL 10 MG PO TABS: 5.0000 mg | ORAL_TABLET | Freq: Every evening | ORAL | 1 refills | Status: DC | PRN

## 2019-10-04 MED ORDER — OMEPRAZOLE 20 MG PO CPDR: 20.0000 mg | DELAYED_RELEASE_CAPSULE | Freq: Every day | ORAL | 3 refills | Status: DC

## 2019-10-04 NOTE — Progress Notes (Signed)
Wants to cut back on her acid reflux medication and the Flexeril

## 2019-10-05 ENCOUNTER — Encounter: Payer: Self-pay | Admitting: Gastroenterology

## 2019-10-09 DIAGNOSIS — R1310 Dysphagia, unspecified: Secondary | ICD-10-CM | POA: Insufficient documentation

## 2019-10-09 NOTE — Assessment & Plan Note (Signed)
No recent symptomatic cases.

## 2019-10-09 NOTE — Progress Notes (Signed)
Virtual Visit via Video Note   This visit type was conducted due to national recommendations for restrictions regarding the COVID-19 Pandemic (e.g. social distancing) in an effort to limit this patient's exposure and mitigate transmission in our community.  Due to her co-morbid illnesses, this patient is at least at moderate risk for complications without adequate follow up.  This format is felt to be most appropriate for this patient at this time.  All issues noted in this document were discussed and addressed.  A limited physical exam was performed with this format.  Please refer to the patient's chart for her consent to telehealth for [facility].   Date:  10/10/2019   ID:  [name], DOB 04-04-1966, MRN [id number]  Patient Location: Home Provider Location: Office  PCP:  Mosie Lukes, MD  Cardiologist:  Larae Grooms, MD  Electrophysiologist:  None   Evaluation Performed:  Follow-Up Visit  Chief Complaint:  palpitations  History of Present Illness:    Diane Gutierrez is a 53 y.o. female with who was seen in 03/2019 for the evaluation of palpitaitonsat the request of Dorothy Puffer, PA-C.  Prior records showed: "She had a hysterectomy in 2019, and developed palpitations after the surgery. SHe had a monitor in 3/2019showing only some Very rare PVCs. Avg HR 88.   She was started on a beta blocker.   Echoin 2019showed normal LV function.   She reports some orthopnea and dizziness when she sits up.   She has fatigue. She has had chronic pain as well.   She admits to being "anxious and hyper."  She has had occasional right leg swelling. She has had lymph nodes removed from her groins as well.   Brother died from an arrhythmia, in the setting of myocarditis presumably. Both parents had heart attacks. Father with CABG in his 63s."  Metoprolol was used for palpitations, but had increased fatigue.  Metoprolol dose was then decreased to 25  mg daily.   However, palpitations increased so metoprolol was increased to 25 mg BID in 06/2019.    Since the last visit, she does forget the evening dose of metoprolol.  She takes the medicine with food.  She has improved her diet and lost 8 lbs.  The patient does not have symptoms concerning for COVID-19 infection (fever, chills, cough, or new shortness of breath).    Past Medical History:  Diagnosis Date   Abdominal pain    Acid reflux    Anxiety    Colicky RLQ abdominal pain 07/07/2019   Fibrocystic breast 07/10/2019   Hiatal hernia    Hyperlipidemia    Leukopenia    Low back pain    Lung nodule    Neck pain    Palpitations    Seasonal allergies    Seizure ([facility])    childhood   Sneezing    SOB (shortness of breath)    Tachycardia    Tachycardia [id number]   Umbilical hernia    Weight gain    Past Surgical History:  Procedure Laterality Date   ABDOMINAL HYSTERECTOMY     precancerous   BREAST BIOPSY Left    BREAST SURGERY     lumpectomy, cyst aspirated.on left   CHOLECYSTECTOMY     HYSTEROSCOPY DIAGNOSTIC     JOINT REPLACEMENT Left    knee x 2 with screws in place   NECK SURGERY     Dr Patrice Paradise     Current Meds  Medication Sig   acetaminophen (TYLENOL)  325 MG tablet Take 650 mg by mouth every 6 (six) hours as needed.   BIOTIN PO Take by mouth.   Calcium Carb-Cholecalciferol (CALCIUM CARBONATE-VITAMIN D3 PO) Take by mouth.   clonazePAM (KLONOPIN) 0.5 MG tablet Take 0.5 mg by mouth 2 (two) times daily as needed for anxiety.   cyclobenzaprine (FLEXERIL) 10 MG tablet Take 0.5-1 tablets (5-10 mg total) by mouth at bedtime as needed for muscle spasms.   gabapentin (NEURONTIN) 300 MG capsule Take 1 capsule (300 mg total) by mouth daily.   hyoscyamine (LEVSIN SL) 0.125 MG SL tablet Place 1 tablet (0.125 mg total) under the tongue every 4 (four) hours as needed.   Krill Oil 1000 MG CAPS Take by mouth.   metoprolol succinate (TOPROL-XL) 25  MG 24 hr tablet Take 1 tablet (25 mg total) by mouth 2 (two) times daily. Take with or immediately following a meal.   Multiple Vitamin (MULTIVITAMIN) tablet Take 1 tablet by mouth daily.   omeprazole (PRILOSEC) 20 MG capsule Take 1 capsule (20 mg total) by mouth daily.   promethazine (PHENERGAN) 12.5 MG tablet Take 12.5 mg by mouth every 6 (six) hours as needed for nausea or vomiting.     Allergies:   Prednisone and Ibuprofen   Social History   Tobacco Use   Smoking status: Never Smoker   Smokeless tobacco: Never Used  Substance Use Topics   Alcohol use: Not Currently   Drug use: Never     Family Hx: The patient's family history includes Arthritis in her maternal grandfather, maternal grandmother, and mother; Bipolar disorder in her father; Cancer in her paternal grandfather; Diabetes in her father and sister; Drug abuse in her sister; Hearing loss in her mother; Heart disease in her brother, father, maternal grandfather, maternal grandmother, mother, paternal grandfather, and paternal grandmother; Hyperlipidemia in her father and mother; Hypertension in her father; Other in her father and sister; Sleep apnea in her brother; Thyroid cancer in her mother.  ROS:   Please see the history of present illness.    Occasional pressure in her chest; dizziness on occasion All other systems reviewed and are negative.   Prior CV studies:   The following studies were reviewed today:    Labs/Other Tests and Data Reviewed:    EKG:  An ECG dated 03/2019 was personally reviewed today and demonstrated:  NSR, nonspecific ST changes  Recent Labs: 07/07/2019: ALT 34; BUN 9; Creatinine, Ser 0.85; Hemoglobin 13.8; Platelets 256.0; Potassium 4.2; Sodium 142; TSH 2.82   Recent Lipid Panel Lab Results  Component Value Date/Time   CHOL 218 (H) 07/07/2019 11:02 AM   TRIG 190.0 (H) 07/07/2019 11:02 AM   HDL 37.60 (L) 07/07/2019 11:02 AM   CHOLHDL 6 07/07/2019 11:02 AM   LDLCALC 142 (H)  07/07/2019 11:02 AM    Wt Readings from Last 3 Encounters:  10/10/19 157 lb 12.8 oz (71.6 kg)  10/04/19 157 lb 12.8 oz (71.6 kg)  07/07/19 165 lb (74.8 kg)     Objective:    Vital Signs:  BP (!) 116/99    Pulse 100    Ht 5\' 5"  (1.651 m)    Wt 157 lb 12.8 oz (71.6 kg)    BMI 26.26 kg/m    VITAL SIGNS:  reviewed GEN:  no acute distress RESPIRATORY:  normal respiratory effort, symmetric expansion CARDIOVASCULAR:  no peripheral edema NEURO:  alert and oriented x 3, no obvious focal deficit PSYCH:  normal affect exam limited by video format  ASSESSMENT &  PLAN:    1. Palpitations: Sx improved.  COntinue metoprolol.  I will leave it up to her if she feels that she needs the evening dose of metoprolol based on her sx. 2. Chest pains:  She has had some atypical chest pain in the past-sx controlled at this time.  Has esophageal stricture that may need to be dilated.  3. Weight loss: intentional with healthy diet.  She remains active at the house.   4. Dizziness: Intermittent. Stay hydrated. No syncope.   COVID-19 Education: The signs and symptoms of COVID-19 were discussed with the patient and how to seek care for testing (follow up with PCP or arrange E-visit).  The importance of social distancing was discussed today.  Time:   Today, I have spent 17 minutes with the patient with telehealth technology discussing the above problems.     Medication Adjustments/Labs and Tests Ordered: Current medicines are reviewed at length with the patient today.  Concerns regarding medicines are outlined above.   Tests Ordered: No orders of the defined types were placed in this encounter.   Medication Changes: No orders of the defined types were placed in this encounter.   Follow Up:  Virtual Visit  in 6 month(s)  Signed, Larae Grooms, MD  10/10/2019 8:51 AM    [place]

## 2019-10-09 NOTE — Progress Notes (Signed)
Patient ID: Diane Gutierrez, female   DOB: May 12, 1966, 53 y.o.   MRN: [id number] Virtual Visit via Video Note  I connected with [facility] on 10/09/19 at  3:40 PM EST by a video enabled telemedicine application and verified that I am speaking with the correct person using two identifiers.  Location: Patient: home Provider: office   I discussed the limitations of evaluation and management by telemedicine and the availability of in person appointments. The patient expressed understanding and agreed to proceed.    Subjective:    Patient ID: Diane Gutierrez, female    DOB: 1966-07-12, 53 y.o.   MRN: [id number]  No chief complaint on file.   HPI Patient is in today for follow up on chronic medical concerns including reflux, dysphagia, hyperlipidemia and more. No recent febrile illness or hospitalizations. She continues to have trouble with neck pain due to previous injury with plates and screws in place. She has a history of reflux which is doing better as she is eating better and has lost some weight.. she has dropped her Omprazole 40 mg from bid to qd and is feeling well. Denies CP/palp/SOB/HA/congestion/fevers or GU c/o. Taking meds as prescribed  Past Medical History:  Diagnosis Date  . Abdominal pain   . Acid reflux   . Anxiety   . Colicky RLQ abdominal pain 07/07/2019  . Fibrocystic breast 07/10/2019  . Hiatal hernia   . Hyperlipidemia   . Leukopenia   . Low back pain   . Lung nodule   . Neck pain   . Palpitations   . Seasonal allergies   . Seizure ([place])    childhood  . Sneezing   . SOB (shortness of breath)   . Tachycardia   . Tachycardia 07/07/2019  . Umbilical hernia   . Weight gain     Past Surgical History:  Procedure Laterality Date  . ABDOMINAL HYSTERECTOMY     precancerous  . BREAST BIOPSY Left   . BREAST SURGERY     lumpectomy, cyst aspirated.on left  . CHOLECYSTECTOMY    . HYSTEROSCOPY DIAGNOSTIC    . JOINT REPLACEMENT Left    knee  x 2 with screws in place  . NECK SURGERY     Dr Patrice Paradise    Family History  Problem Relation Age of Onset  . Heart disease Father        s/p CABG  . Diabetes Father   . Hyperlipidemia Father   . Hypertension Father   . Other Father   . Bipolar disorder Father   . Heart disease Brother   . Sleep apnea Brother   . Arthritis Mother   . Hearing loss Mother   . Heart disease Mother        MI  . Hyperlipidemia Mother   . Thyroid cancer Mother   . Drug abuse Sister        crack cocaine  . Other Sister   . Diabetes Sister        smoker  . Arthritis Maternal Grandmother   . Heart disease Maternal Grandmother   . Arthritis Maternal Grandfather   . Heart disease Maternal Grandfather   . Heart disease Paternal Grandmother   . Cancer Paternal Grandfather   . Heart disease Paternal Grandfather     Social History   Socioeconomic History  . Marital status: Married    Spouse name: Not on file  . Number of children: Not on file  . Years of education: Not on file  .  Highest education level: Not on file  Occupational History  . Not on file  Social Needs  . Financial resource strain: Not on file  . Food insecurity    Worry: Not on file    Inability: Not on file  . Transportation needs    Medical: Not on file    Non-medical: Not on file  Tobacco Use  . Smoking status: Never Smoker  . Smokeless tobacco: Never Used  Substance and Sexual Activity  . Alcohol use: Not Currently  . Drug use: Never  . Sexual activity: Not on file  Lifestyle  . Physical activity    Days per week: Not on file    Minutes per session: Not on file  . Stress: Not on file  Relationships  . Social Herbalist on phone: Not on file    Gets together: Not on file    Attends religious service: Not on file    Active member of club or organization: Not on file    Attends meetings of clubs or organizations: Not on file    Relationship status: Not on file  . Intimate partner violence    Fear of  current or ex partner: Not on file    Emotionally abused: Not on file    Physically abused: Not on file    Forced sexual activity: Not on file  Other Topics Concern  . Not on file  Social History Narrative   Lives with husband is on disability, previously worked for [employer], no dietary restrictions.     Outpatient Medications Prior to Visit  Medication Sig Dispense Refill  . acetaminophen (TYLENOL) 325 MG tablet Take 650 mg by mouth every 6 (six) hours as needed.    Marland Kitchen BIOTIN PO Take by mouth.    . Calcium Carb-Cholecalciferol (CALCIUM CARBONATE-VITAMIN D3 PO) Take by mouth.    . clonazePAM (KLONOPIN) 0.5 MG tablet Take 0.5 mg by mouth 2 (two) times daily as needed for anxiety.    . gabapentin (NEURONTIN) 300 MG capsule Take 1 capsule (300 mg total) by mouth daily. 90 capsule 1  . hyoscyamine (LEVSIN SL) 0.125 MG SL tablet Place 1 tablet (0.125 mg total) under the tongue every 4 (four) hours as needed. 30 tablet 1  . metoprolol succinate (TOPROL-XL) 25 MG 24 hr tablet Take 1 tablet (25 mg total) by mouth 2 (two) times daily. Take with or immediately following a meal. 180 tablet 3  . Multiple Vitamin (MULTIVITAMIN) tablet Take 1 tablet by mouth daily.    . Omega-3 Fatty Acids (FISH OIL) 1000 MG CAPS Take 1,000 mg by mouth daily.    . promethazine (PHENERGAN) 12.5 MG tablet Take 12.5 mg by mouth every 6 (six) hours as needed for nausea or vomiting.    . cyclobenzaprine (FLEXERIL) 10 MG tablet Take 1 tablet (10 mg total) by mouth daily. 30 tablet 1  . omeprazole (PRILOSEC) 40 MG capsule Take 40 mg by mouth daily.     No facility-administered medications prior to visit.     Allergies  Allergen Reactions  . Prednisone Rash    RASH, severe  . Ibuprofen     GI UPSET    Review of Systems  Constitutional: Negative for fever and malaise/fatigue.  HENT: Negative for congestion.   Eyes: Negative for blurred vision.  Respiratory: Negative for shortness of breath.   Cardiovascular:  Negative for chest pain, palpitations and leg swelling.  Gastrointestinal: Positive for heartburn. Negative for abdominal pain, blood in  stool and nausea.  Genitourinary: Negative for dysuria and frequency.  Musculoskeletal: Positive for neck pain. Negative for falls.  Skin: Negative for rash.  Neurological: Negative for dizziness, loss of consciousness and headaches.  Endo/Heme/Allergies: Negative for environmental allergies.  Psychiatric/Behavioral: Negative for depression. The patient is not nervous/anxious.        Objective:    Physical Exam Constitutional:      Appearance: Normal appearance. She is not ill-appearing.  HENT:     Head: Normocephalic and atraumatic.  Eyes:     General:        Right eye: No discharge.        Left eye: No discharge.  Pulmonary:     Effort: Pulmonary effort is normal.  Neurological:     Mental Status: She is alert and oriented to person, place, and time.  Psychiatric:        Mood and Affect: Mood normal.        Behavior: Behavior normal.     BP (!) 128/98 (BP Location: Left Arm, Patient Position: Sitting, Cuff Size: Normal)   Pulse (!) 106   Wt 157 lb 12.8 oz (71.6 kg)   SpO2 98%   BMI 26.26 kg/m  Wt Readings from Last 3 Encounters:  10/04/19 157 lb 12.8 oz (71.6 kg)  07/07/19 165 lb (74.8 kg)  06/22/19 164 lb (74.4 kg)    Diabetic Foot Exam - Simple   No data filed     Lab Results  Component Value Date   WBC 5.8 07/07/2019   HGB 13.8 07/07/2019   HCT 40.4 07/07/2019   PLT 256.0 07/07/2019   GLUCOSE 84 07/07/2019   CHOL 218 (H) 07/07/2019   TRIG 190.0 (H) 07/07/2019   HDL 37.60 (L) 07/07/2019   LDLCALC 142 (H) 07/07/2019   ALT 34 07/07/2019   AST 29 07/07/2019   NA 142 07/07/2019   K 4.2 07/07/2019   CL 104 07/07/2019   CREATININE 0.85 07/07/2019   BUN 9 07/07/2019   CO2 30 07/07/2019   TSH 2.82 07/07/2019    Lab Results  Component Value Date   TSH 2.82 07/07/2019   Lab Results  Component Value Date   WBC 5.8  07/07/2019   HGB 13.8 07/07/2019   HCT 40.4 07/07/2019   MCV 89.4 07/07/2019   PLT 256.0 07/07/2019   Lab Results  Component Value Date   NA 142 07/07/2019   K 4.2 07/07/2019   CO2 30 07/07/2019   GLUCOSE 84 07/07/2019   BUN 9 07/07/2019   CREATININE 0.85 07/07/2019   BILITOT 0.5 07/07/2019   ALKPHOS 83 07/07/2019   AST 29 07/07/2019   ALT 34 07/07/2019   PROT 6.8 07/07/2019   ALBUMIN 4.2 07/07/2019   CALCIUM 9.4 07/07/2019   GFR 69.88 07/07/2019   Lab Results  Component Value Date   CHOL 218 (H) 07/07/2019   Lab Results  Component Value Date   HDL 37.60 (L) 07/07/2019   Lab Results  Component Value Date   LDLCALC 142 (H) 07/07/2019   Lab Results  Component Value Date   TRIG 190.0 (H) 07/07/2019   Lab Results  Component Value Date   CHOLHDL 6 07/07/2019   No results found for: HGBA1C     Assessment & Plan:   Problem List Items Addressed This Visit    Tachycardia    No recent symptomatic cases.       Hyperlipidemia    Encouraged heart healthy diet, increase exercise, avoid trans fats, consider  a krill oil cap daily      Neck pain    Encouraged moist heat and gentle stretching as tolerated. May try NSAIDs and prescription meds as directed and report if symptoms worsen or seek immediate care. Has plates and screws in place. Uses Flexeril prn, may continue. Follows with dr Patrice Paradise.       Acid reflux    Has dropped her Omeprazole from 40 mg bid to qd and is now ready to 20 mg qd. Consider then switching to Famotidine now.       Relevant Medications   omeprazole (PRILOSEC) 20 MG capsule   Other Relevant Orders   Ambulatory referral to Gastroenterology   IBS (irritable bowel syndrome) - Primary    Recent increase in constipation with occasional BRBPR, increased abdominal pain. Referred to gastroenterology      Relevant Medications   omeprazole (PRILOSEC) 20 MG capsule   Other Relevant Orders   Ambulatory referral to Gastroenterology   Dysphagia     Esophageal strictures in past requiring dilatation.       Relevant Orders   Ambulatory referral to Gastroenterology    Other Visit Diagnoses    Change in bowel habits       Relevant Orders   Ambulatory referral to Gastroenterology   Blood in stool       Relevant Orders   Ambulatory referral to Gastroenterology      I have discontinued Anjelika L. Favero "Kim"'s omeprazole. I have also changed her cyclobenzaprine. Additionally, I am having her start on omeprazole. Lastly, I am having her maintain her BIOTIN PO, Calcium Carb-Cholecalciferol (CALCIUM CARBONATE-VITAMIN D3 PO), clonazePAM, multivitamin, promethazine, acetaminophen, Fish Oil, metoprolol succinate, hyoscyamine, and gabapentin.  Meds ordered this encounter  Medications  . omeprazole (PRILOSEC) 20 MG capsule    Sig: Take 1 capsule (20 mg total) by mouth daily.    Dispense:  30 capsule    Refill:  3  . cyclobenzaprine (FLEXERIL) 10 MG tablet    Sig: Take 0.5-1 tablets (5-10 mg total) by mouth at bedtime as needed for muscle spasms.    Dispense:  90 tablet    Refill:  1     I discussed the assessment and treatment plan with the patient. The patient was provided an opportunity to ask questions and all were answered. The patient agreed with the plan and demonstrated an understanding of the instructions.   The patient was advised to call back or seek an in-person evaluation if the symptoms worsen or if the condition fails to improve as anticipated.  I provided 25 minutes of non-face-to-face time during this encounter.   Penni Homans, MD

## 2019-10-09 NOTE — Assessment & Plan Note (Signed)
Encouraged moist heat and gentle stretching as tolerated. May try NSAIDs and prescription meds as directed and report if symptoms worsen or seek immediate care. Has plates and screws in place. Uses Flexeril prn, may continue. Follows with dr Patrice Paradise.

## 2019-10-09 NOTE — Assessment & Plan Note (Signed)
Recent increase in constipation with occasional BRBPR, increased abdominal pain. Referred to gastroenterology

## 2019-10-09 NOTE — Assessment & Plan Note (Signed)
Has dropped her Omeprazole from 40 mg bid to qd and is now ready to 20 mg qd. Consider then switching to Famotidine now.

## 2019-10-09 NOTE — Assessment & Plan Note (Signed)
Esophageal strictures in past requiring dilatation.

## 2019-10-09 NOTE — Assessment & Plan Note (Signed)
Encouraged heart healthy diet, increase exercise, avoid trans fats, consider a krill oil cap daily 

## 2019-10-10 ENCOUNTER — Encounter: Payer: Self-pay | Admitting: Interventional Cardiology

## 2019-10-10 ENCOUNTER — Telehealth (INDEPENDENT_AMBULATORY_CARE_PROVIDER_SITE_OTHER): Payer: Medicare Other | Admitting: Interventional Cardiology

## 2019-10-10 ENCOUNTER — Other Ambulatory Visit: Payer: Self-pay

## 2019-10-10 VITALS — BP 116/99 | HR 100 | Ht 65.0 in | Wt 157.8 lb

## 2019-10-10 DIAGNOSIS — R002 Palpitations: Secondary | ICD-10-CM

## 2019-10-10 DIAGNOSIS — R42 Dizziness and giddiness: Secondary | ICD-10-CM

## 2019-10-10 DIAGNOSIS — R072 Precordial pain: Secondary | ICD-10-CM | POA: Diagnosis not present

## 2019-10-10 NOTE — Patient Instructions (Signed)
Medication Instructions:  Your physician recommends that you continue on your current medications as directed. Please refer to the Current Medication list given to you today.  *If you need a refill on your cardiac medications before your next appointment, please call your pharmacy*  Lab Work: None ordered  If you have labs (blood work) drawn today and your tests are completely normal, you will receive your results only by: . MyChart Message (if you have MyChart) OR . A paper copy in the mail If you have any lab test that is abnormal or we need to change your treatment, we will call you to review the results.  Testing/Procedures: None ordered  Follow-Up: At [facility], you and your health needs are our priority.  As part of our continuing mission to provide you with exceptional heart care, we have created designated Provider Care Teams.  These Care Teams include your primary Cardiologist (physician) and Advanced Practice Providers (APPs -  Physician Assistants and Nurse Practitioners) who all work together to provide you with the care you need, when you need it.  Your next appointment:   6 month(s)  The format for your next appointment:   Either In Person or Virtual  Provider:   You may see Jayadeep Varanasi, MD or one of the following Advanced Practice Providers on your designated Care Team:    Dayna Dunn, PA-C  Michele Lenze, PA-C   Other Instructions   

## 2019-10-28 ENCOUNTER — Ambulatory Visit: Payer: Medicare Other | Admitting: Gastroenterology

## 2019-11-11 HISTORY — PX: ROBOTIC ASSISTED TOTAL HYSTERECTOMY: SHX6085

## 2019-11-21 ENCOUNTER — Encounter: Payer: Self-pay | Admitting: Gastroenterology

## 2019-11-21 ENCOUNTER — Ambulatory Visit (INDEPENDENT_AMBULATORY_CARE_PROVIDER_SITE_OTHER): Payer: Medicare Other | Admitting: Gastroenterology

## 2019-11-21 ENCOUNTER — Other Ambulatory Visit: Payer: Self-pay

## 2019-11-21 VITALS — BP 128/76 | HR 87 | Temp 98.0°F | Ht 65.0 in | Wt 160.0 lb

## 2019-11-21 DIAGNOSIS — K581 Irritable bowel syndrome with constipation: Secondary | ICD-10-CM | POA: Diagnosis not present

## 2019-11-21 DIAGNOSIS — R1031 Right lower quadrant pain: Secondary | ICD-10-CM | POA: Diagnosis not present

## 2019-11-21 DIAGNOSIS — R1011 Right upper quadrant pain: Secondary | ICD-10-CM

## 2019-11-21 DIAGNOSIS — K219 Gastro-esophageal reflux disease without esophagitis: Secondary | ICD-10-CM

## 2019-11-21 MED ORDER — PANTOPRAZOLE SODIUM 40 MG PO TBEC: 40.0000 mg | DELAYED_RELEASE_TABLET | Freq: Every day | ORAL | 6 refills | Status: DC

## 2019-11-21 NOTE — Patient Instructions (Addendum)
If you are age 54 or older, your body mass index should be between 23-30. Your Body mass index is 26.63 kg/m. If this is out of the aforementioned range listed, please consider follow up with your Primary Care Provider.  If you are age 80 or younger, your body mass index should be between 19-25. Your Body mass index is 26.63 kg/m. If this is out of the aformentioned range listed, please consider follow up with your Primary Care Provider.   You have [facility] [place] [address] [address] [facility]   You are scheduled on 11/28/19 at [place] should arrive 15 minutes prior to your appointment time for registration. Please follow the written instructions below on the day of your exam:  WARNING: IF YOU ARE ALLERGIC TO IODINE/X-RAY DYE, PLEASE NOTIFY RADIOLOGY IMMEDIATELY AT [phone]! YOU WILL BE GIVEN A 13 HOUR PREMEDICATION PREP.  1) Do not eat or drink anything after 4am (4 hours prior to your test) 2) You have been given 2 bottles of oral contrast to drink. The solution may taste better if refrigerated, but do NOT add ice or any other liquid to this solution. Shake well before drinking.    Drink 1 bottle of contrast @ 6am (2 hours prior to your exam)  Drink 1 bottle of contrast @ 7am (1 hour prior to your exam)  You may take any medications as prescribed with a small amount of water, if necessary. If you take any of the following medications: METFORMIN, GLUCOPHAGE, GLUCOVANCE, AVANDAMET, RIOMET, FORTAMET, [place] MET, JANUMET, GLUMETZA or METAGLIP, you MAY be asked to HOLD this medication 48 hours AFTER the exam.  The purpose of you drinking the oral contrast is to aid in the visualization of your intestinal tract. The contrast solution may cause some diarrhea. Depending on your individual set of symptoms, you may also receive an intravenous injection of x-ray contrast/dye. Plan on being at [facility] for 30 minutes or longer, depending on the type of exam you are having performed.  This test typically takes 30-45 minutes to complete.  If you have any questions regarding your exam or if you need to reschedule, you may call the CT department at [phone] between the hours of 8:00 am and 5:00 pm, Monday-Friday.  ________________________________________________________________________  We have sent the following medications to your pharmacy for you to pick up at your convenience: Protonix   Please purchase the following medications over the counter and take as directed: Magnesium 250 mg once daily may decrease to 1/2 dose if you have diarrhea.   Please stop at the lab before you leave the office today.    Follow up in 12 weeks.   Thank you,  Dr. Jackquline Denmark

## 2019-11-21 NOTE — Progress Notes (Signed)
Chief Complaint:   Referring Provider:  Mosie Lukes, MD      ASSESSMENT AND PLAN;   #1. GERD with HH with occasional dysphagia.  #2.  H/O polyps. Neg last colon Dr. Rolm Bookbinder 08/2017.  Report awaited.  #3. RLQ abdo pain/RUQ abdo pain. Neg [place] abdo 07/2019 except for fatty liver, s/p cholecystectomy.  #4. IBS with constipation. Neg colon 09/2017 at [facility] by Dr Rolm Bookbinder except for diverticulosis and hemorrhoids.  #5.  Fatty liver with normal LFTs.  Plan: - Please obtain previous records (last EGD/colon 2018). - Proceed with CT scan abdo/pelvis with p.o. and IV contrast. - Switch from omeprazole to protonix 40mg  po qd, 30 with 6 refills. - Check CBC, CMP, celiac screen, TSH, CRP - Mg 250 mg po qd. If diarrhea, decrease to 1/2.  If she continues to have problems, would give her a trial of Linzess or Trulance. - Encouraged her to reduce weight as treatment for fatty liver. - FU in 12 weeks. If still with problems, EGD with possible dilatation if needed.    HPI:    Diane Gutierrez is a 54 y.o. female  Seen as an emergency workin With multiple GI problems Has been to multiple gastroenterologists.   - RUQ/RLQ abdominal pain along with chronic pelvic pain x several months, getting worse.  She describes this as sharp, nonradiating, associated with abdominal bloating, made worse after eating.  Associated with nausea and only occasional vomiting.  She does have decreased appetite.  Had negative EGD/colonoscopy previously.  Report is awaited.  -Longstanding gastroesophageal reflux. Dx with hiatal hernia by means of EGD and CT. occasional problem swallowing mainly solids.  Does have heartburn despite omeprazole.  Has hoarseness.  Had Candida esophagitis on previous EGD and underwent treatment with Diflucan.  She also had esophageal dilatation at the last EGD.  Dysphagia did improve for approximately 1 to 2 years.  -Had longstanding history of diarrhea, diagnosed with IBS with  diarrhea.  After hysterectomy in 2019, she switched over to constipation.  Currently more constipated with pellet-like stools. BMs 1/2-3 days, associated with abdominal bloating, straining, occasional rectal bleeding which has been attributed to hemorrhoids.  No fever chills or night sweats.  No weight loss.  Has tried multiple medications including MiraLAX without any significant relief.  She has been in study of Zelnorm with Dr. Elita Quick -had multiple colonoscopies with him in 2004-2009.  Zelnorm did work well.    Past GI procedures: -Had multiple colonoscopies by Dr. Devota Pace, then Dr Ferdinand Lango Romelle Starcher).  Had colonic polyps previously.  And most recently by Dr. Rolm Bookbinder 08/2017 -negative per patient except for diverticulosis and hemorrhoids.  Report awaited. -Had EGD with dilatation Dr. Rolm Bookbinder 08/2017.  Esophageal biopsies did show Candida.  Treated with Diflucan 400 mg p.o. x1 followed by 200 mg p.o. once a day for 2 weeks.  Report awaited. -CT without contrast 03/2018: Small hiatal hernia, small umbilical hernia, no acute abnormalities.  No bowel obstruction. -CT abdomen/pelvis with contrast 11/2015: Subtle mild inflammatory changes in the right adnexa.  Otherwise unremarkable.  Did have mild fatty liver.  Past Medical History:  Diagnosis Date  . Abdominal pain   . Acid reflux   . Anxiety   . Colicky RLQ abdominal pain 07/07/2019  . Fibrocystic breast 07/10/2019  . Hiatal hernia   . Hyperlipidemia   . Leukopenia   . Low back pain   . Lung nodule   . Neck pain   . Palpitations   .  Seasonal allergies   . Seizure ([place])    childhood  . Sneezing   . SOB (shortness of breath)   . Tachycardia   . Tachycardia 07/07/2019  . Umbilical hernia   . Weight gain     Past Surgical History:  Procedure Laterality Date  . ABDOMINAL HYSTERECTOMY     precancerous  . BREAST BIOPSY Left   . BREAST SURGERY     lumpectomy, cyst aspirated.on left  . CHOLECYSTECTOMY    . HYSTEROSCOPY  DIAGNOSTIC    . JOINT REPLACEMENT Left    knee x 2 with screws in place  . NECK SURGERY     Dr Patrice Paradise    Family History  Problem Relation Age of Onset  . Heart disease Father        s/p CABG  . Diabetes Father   . Hyperlipidemia Father   . Hypertension Father   . Other Father   . Bipolar disorder Father   . Heart disease Brother   . Sleep apnea Brother   . Arthritis Mother   . Hearing loss Mother   . Heart disease Mother        MI  . Hyperlipidemia Mother   . Thyroid cancer Mother   . Drug abuse Sister        crack cocaine  . Other Sister   . Diabetes Sister        smoker  . Arthritis Maternal Grandmother   . Heart disease Maternal Grandmother   . Arthritis Maternal Grandfather   . Heart disease Maternal Grandfather   . Heart disease Paternal Grandmother   . Cancer Paternal Grandfather   . Heart disease Paternal Grandfather     Social History   Tobacco Use  . Smoking status: Never Smoker  . Smokeless tobacco: Never Used  Substance Use Topics  . Alcohol use: Not Currently  . Drug use: Never    Current Outpatient Medications  Medication Sig Dispense Refill  . acetaminophen (TYLENOL) 325 MG tablet Take 650 mg by mouth every 6 (six) hours as needed.    Marland Kitchen BIOTIN PO Take by mouth.    . Calcium Carb-Cholecalciferol (CALCIUM CARBONATE-VITAMIN D3 PO) Take by mouth.    . clonazePAM (KLONOPIN) 0.5 MG tablet Take 0.5 mg by mouth 2 (two) times daily as needed for anxiety.    . cyclobenzaprine (FLEXERIL) 10 MG tablet Take 0.5-1 tablets (5-10 mg total) by mouth at bedtime as needed for muscle spasms. 90 tablet 1  . gabapentin (NEURONTIN) 300 MG capsule Take 1 capsule (300 mg total) by mouth daily. 90 capsule 1  . hyoscyamine (LEVSIN SL) 0.125 MG SL tablet Place 1 tablet (0.125 mg total) under the tongue every 4 (four) hours as needed. 30 tablet 1  . Krill Oil 1000 MG CAPS Take 350 mg by mouth.     . metoprolol succinate (TOPROL-XL) 25 MG 24 hr tablet Take 1 tablet (25 mg  total) by mouth 2 (two) times daily. Take with or immediately following a meal. 180 tablet 3  . Multiple Vitamin (MULTIVITAMIN) tablet Take 1 tablet by mouth daily.    Marland Kitchen omeprazole (PRILOSEC) 20 MG capsule Take 1 capsule (20 mg total) by mouth daily. 30 capsule 3  . promethazine (PHENERGAN) 12.5 MG tablet Take 12.5 mg by mouth every 6 (six) hours as needed for nausea or vomiting.     No current facility-administered medications for this visit.    Allergies  Allergen Reactions  . Prednisone Rash    RASH,  severe  . Ibuprofen     GI UPSET    Review of Systems:  Constitutional: Denies fever, chills, diaphoresis, appetite change and fatigue.  HEENT: Has vision changes, sinus problems, headaches and voice changes in form of hoarseness.   Respiratory: Denies SOB, DOE, cough, chest tightness,  and wheezing.   Cardiovascular: Denies chest pain, palpitations and leg swelling.  Genitourinary: Denies dysuria, urgency, frequency, hematuria, flank pain and difficulty urinating.  Musculoskeletal: Has myalgias, back pain, joint swelling, arthralgias and gait problem.  Skin: No rash.  Neurological: Denies dizziness, seizures, syncope, weakness, light-headedness, numbness and headaches.  Hematological: Denies adenopathy. Easy bruising, personal or family bleeding history  Psychiatric/Behavioral: Has anxiety or depression.  Has sleeping problems.     Physical Exam:    BP 128/76   Pulse 87   Temp 98 F (36.7 C)   Ht 5\' 5"  (1.651 m)   Wt 160 lb (72.6 kg)   BMI 26.63 kg/m  Wt Readings from Last 3 Encounters:  11/21/19 160 lb (72.6 kg)  10/10/19 157 lb 12.8 oz (71.6 kg)  10/04/19 157 lb 12.8 oz (71.6 kg)   Constitutional:  Well-developed, in no acute distress. Psychiatric: Normal mood and affect. Behavior is normal. HEENT: Pupils normal.  Conjunctivae are normal. No scleral icterus, no oral thrush. Neck supple.  Cardiovascular: Normal rate, regular rhythm. No edema Pulmonary/chest: Effort  normal and breath sounds normal. No wheezing, rales or rhonchi. Abdominal: Soft, nondistended.  Tenderness right lower quadrant , right upper quadrant.  No rebound.  Bowel sounds active throughout. There are no masses palpable. No hepatomegaly. Rectal:  defered Neurological: Alert and oriented to person place and time. Skin: Skin is warm and dry. No rashes noted.  Data Reviewed: I have personally reviewed following labs and imaging studies  CBC: CBC Latest Ref Rng & Units 07/07/2019  WBC 4.0 - 10.5 K/uL 5.8  Hemoglobin 12.0 - 15.0 g/dL 13.8  Hematocrit 36.0 - 46.0 % 40.4  Platelets 150.0 - 400.0 K/uL 256.0    CMP: CMP Latest Ref Rng & Units 07/07/2019  Glucose 70 - 99 mg/dL 84  BUN 6 - 23 mg/dL 9  Creatinine 0.40 - 1.20 mg/dL 0.85  Sodium 135 - 145 mEq/L 142  Potassium 3.5 - 5.1 mEq/L 4.2  Chloride 96 - 112 mEq/L 104  CO2 19 - 32 mEq/L 30  Calcium 8.4 - 10.5 mg/dL 9.4  Total Protein 6.0 - 8.3 g/dL 6.8  Total Bilirubin 0.2 - 1.2 mg/dL 0.5  Alkaline Phos 39 - 117 U/L 83  AST 0 - 37 U/L 29  ALT 0 - 35 U/L 34   Extensive records were reviewed.  Several records are awaited.   Carmell Austria, MD 11/21/2019, 3:00 PM  Cc: Mosie Lukes, MD

## 2019-11-22 ENCOUNTER — Telehealth: Payer: Self-pay | Admitting: Gastroenterology

## 2019-11-22 NOTE — Telephone Encounter (Signed)
Called and spoke with patient-patient advised to call [phone] to reschedule her CT scan; Patient verbalized understanding of information/instructions;   Patient advised to call back to the office at [phone] should questions/concerns arise;

## 2019-11-28 ENCOUNTER — Other Ambulatory Visit (HOSPITAL_BASED_OUTPATIENT_CLINIC_OR_DEPARTMENT_OTHER): Payer: Medicare Other

## 2019-12-01 ENCOUNTER — Other Ambulatory Visit (INDEPENDENT_AMBULATORY_CARE_PROVIDER_SITE_OTHER): Payer: Medicare Other

## 2019-12-01 DIAGNOSIS — R1011 Right upper quadrant pain: Secondary | ICD-10-CM

## 2019-12-01 DIAGNOSIS — K581 Irritable bowel syndrome with constipation: Secondary | ICD-10-CM

## 2019-12-01 DIAGNOSIS — K219 Gastro-esophageal reflux disease without esophagitis: Secondary | ICD-10-CM | POA: Diagnosis not present

## 2019-12-01 DIAGNOSIS — R1031 Right lower quadrant pain: Secondary | ICD-10-CM | POA: Diagnosis not present

## 2019-12-01 LAB — CBC WITH DIFFERENTIAL/PLATELET
Basophils Absolute: 0 10*3/uL (ref 0.0–0.1)
Basophils Relative: 0.6 % (ref 0.0–3.0)
Eosinophils Absolute: 0.2 10*3/uL (ref 0.0–0.7)
Eosinophils Relative: 2.9 % (ref 0.0–5.0)
HCT: 42.3 % (ref 36.0–46.0)
Hemoglobin: 14 g/dL (ref 12.0–15.0)
Lymphocytes Relative: 45.1 % (ref 12.0–46.0)
Lymphs Abs: 2.6 10*3/uL (ref 0.7–4.0)
MCHC: 33.2 g/dL (ref 30.0–36.0)
MCV: 90.1 fl (ref 78.0–100.0)
Monocytes Absolute: 0.5 10*3/uL (ref 0.1–1.0)
Monocytes Relative: 8.8 % (ref 3.0–12.0)
Neutro Abs: 2.5 10*3/uL (ref 1.4–7.7)
Neutrophils Relative %: 42.6 % — ABNORMAL LOW (ref 43.0–77.0)
Platelets: 248 10*3/uL (ref 150.0–400.0)
RBC: 4.7 Mil/uL (ref 3.87–5.11)
RDW: 12.9 % (ref 11.5–15.5)
WBC: 5.8 10*3/uL (ref 4.0–10.5)

## 2019-12-01 LAB — COMPREHENSIVE METABOLIC PANEL
ALT: 29 U/L (ref 0–35)
AST: 26 U/L (ref 0–37)
Albumin: 4.2 g/dL (ref 3.5–5.2)
Alkaline Phosphatase: 89 U/L (ref 39–117)
BUN: 9 mg/dL (ref 6–23)
CO2: 29 mEq/L (ref 19–32)
Calcium: 9.4 mg/dL (ref 8.4–10.5)
Chloride: 105 mEq/L (ref 96–112)
Creatinine, Ser: 0.93 mg/dL (ref 0.40–1.20)
GFR: 62.9 mL/min (ref >60.00)
Glucose, Bld: 100 mg/dL — ABNORMAL HIGH (ref 70–99)
Potassium: 4.1 mEq/L (ref 3.5–5.1)
Sodium: 141 mEq/L (ref 135–145)
Total Bilirubin: 0.5 mg/dL (ref 0.2–1.2)
Total Protein: 7 g/dL (ref 6.0–8.3)

## 2019-12-01 LAB — TSH: TSH: 2.13 u[IU]/mL (ref 0.35–4.50)

## 2019-12-01 LAB — C-REACTIVE PROTEIN: CRP: <1 mg/dL (ref 0.5–20.0)

## 2019-12-03 LAB — CELIAC PANEL 10
Antigliadin Abs, IgA: 5 units (ref 0–19)
Endomysial IgA: NEGATIVE
Gliadin IgG: 6 units (ref 0–19)
IgA/Immunoglobulin A, Serum: 218 mg/dL (ref 87–352)
Tissue Transglut Ab: 40 U/mL — ABNORMAL HIGH (ref 0–5)
Transglutaminase IgA: <2 U/mL (ref 0–3)

## 2019-12-05 ENCOUNTER — Other Ambulatory Visit: Payer: Self-pay

## 2019-12-05 ENCOUNTER — Ambulatory Visit (HOSPITAL_BASED_OUTPATIENT_CLINIC_OR_DEPARTMENT_OTHER)
Admission: RE | Admit: 2019-12-05 | Discharge: 2019-12-05 | Disposition: A | Discharge status: Home / Self Care | Payer: Medicare Other | Source: Ambulatory Visit | Attending: Gastroenterology | Admitting: Gastroenterology

## 2019-12-05 DIAGNOSIS — R1011 Right upper quadrant pain: Secondary | ICD-10-CM | POA: Diagnosis present

## 2019-12-05 DIAGNOSIS — K581 Irritable bowel syndrome with constipation: Secondary | ICD-10-CM | POA: Insufficient documentation

## 2019-12-05 DIAGNOSIS — R1031 Right lower quadrant pain: Secondary | ICD-10-CM | POA: Insufficient documentation

## 2019-12-05 DIAGNOSIS — K219 Gastro-esophageal reflux disease without esophagitis: Secondary | ICD-10-CM

## 2019-12-05 MED ORDER — IOHEXOL 300 MG/ML  SOLN
100.0000 mL | Freq: Once | INTRAMUSCULAR | Status: AC | PRN
  Administered 2019-12-05: 10:00:00 100 mL via INTRAVENOUS

## 2019-12-06 ENCOUNTER — Other Ambulatory Visit: Payer: Self-pay

## 2019-12-06 DIAGNOSIS — R1031 Right lower quadrant pain: Secondary | ICD-10-CM

## 2019-12-06 DIAGNOSIS — K581 Irritable bowel syndrome with constipation: Secondary | ICD-10-CM

## 2019-12-06 DIAGNOSIS — K219 Gastro-esophageal reflux disease without esophagitis: Secondary | ICD-10-CM

## 2019-12-06 DIAGNOSIS — Z01818 Encounter for other preprocedural examination: Secondary | ICD-10-CM

## 2019-12-06 DIAGNOSIS — R1011 Right upper quadrant pain: Secondary | ICD-10-CM

## 2019-12-06 NOTE — Progress Notes (Signed)
CT negative except mod HH, fatty liver. Celiac screen positive for significantly elevated TTG highly suggestive of celiac disease. Plan: -Proceed with EGD with small bowel biopsies and dilatation if needed.  -We will do gluten-free after the biopsies  Bre, can we set her up for Friday, February 12 (we just opened that day for endoscopy). Thx RG

## 2020-01-02 ENCOUNTER — Ambulatory Visit (INDEPENDENT_AMBULATORY_CARE_PROVIDER_SITE_OTHER): Payer: Medicare Other

## 2020-01-02 ENCOUNTER — Other Ambulatory Visit: Payer: Self-pay | Admitting: Gastroenterology

## 2020-01-02 ENCOUNTER — Other Ambulatory Visit: Payer: Self-pay

## 2020-01-02 DIAGNOSIS — Z1159 Encounter for screening for other viral diseases: Secondary | ICD-10-CM

## 2020-01-03 LAB — SARS CORONAVIRUS 2 (TAT 6-24 HRS): SARS Coronavirus 2: NEGATIVE

## 2020-01-04 ENCOUNTER — Encounter: Payer: Self-pay | Admitting: Gastroenterology

## 2020-01-04 ENCOUNTER — Ambulatory Visit (AMBULATORY_SURGERY_CENTER): Payer: Medicare Other | Admitting: Gastroenterology

## 2020-01-04 ENCOUNTER — Other Ambulatory Visit: Payer: Self-pay

## 2020-01-04 VITALS — BP 156/98 | HR 103 | Temp 96.6°F | Resp 19 | Ht 65.0 in | Wt 160.0 lb

## 2020-01-04 DIAGNOSIS — R1310 Dysphagia, unspecified: Secondary | ICD-10-CM | POA: Diagnosis not present

## 2020-01-04 DIAGNOSIS — K317 Polyp of stomach and duodenum: Secondary | ICD-10-CM

## 2020-01-04 DIAGNOSIS — K2970 Gastritis, unspecified, without bleeding: Secondary | ICD-10-CM

## 2020-01-04 DIAGNOSIS — K449 Diaphragmatic hernia without obstruction or gangrene: Secondary | ICD-10-CM

## 2020-01-04 DIAGNOSIS — K3189 Other diseases of stomach and duodenum: Secondary | ICD-10-CM

## 2020-01-04 DIAGNOSIS — K219 Gastro-esophageal reflux disease without esophagitis: Secondary | ICD-10-CM

## 2020-01-04 MED ORDER — SODIUM CHLORIDE 0.9 % IV SOLN: 500.0000 mL | Freq: Once | INTRAVENOUS | Status: DC

## 2020-01-04 NOTE — Progress Notes (Signed)
Pt Drowsy. VSS. To PACU, report to RN. No anesthetic complications noted.  

## 2020-01-04 NOTE — Progress Notes (Signed)
Pt's states no medical or surgical changes since previsit or office visit.  JB - temp DT - vitals 

## 2020-01-04 NOTE — Patient Instructions (Signed)
Read all of the handouts given to you by your recovery room nurse.  Do follow the diet restrictions for today.  NO NSAIDS FOR 5 days  ie: aspirin, ibuprofen, aleve to avoid bleeding.   YOU HAD AN ENDOSCOPIC PROCEDURE TODAY AT [facility]:   Refer to the procedure report that was given to you for any specific questions about what was found during the examination.  If the procedure report does not answer your questions, please call your gastroenterologist to clarify.  If you requested that your care partner not be given the details of your procedure findings, then the procedure report has been included in a sealed envelope for you to review at your convenience later.  YOU SHOULD EXPECT: Some feelings of bloating in the abdomen. Passage of more gas than usual.  Walking can help get rid of the air that was put into your GI tract during the procedure and reduce the bloating.   Please Note:  You might notice some irritation and congestion in your nose or some drainage.  This is from the oxygen used during your procedure.  There is no need for concern and it should clear up in a day or so.  SYMPTOMS TO REPORT IMMEDIATELY:   Following upper endoscopy (EGD)  Vomiting of blood or coffee ground material  New chest pain or pain under the shoulder blades  Painful or persistently difficult swallowing  New shortness of breath  Fever of 100F or higher  Black, tarry-looking stools  For urgent or emergent issues, a gastroenterologist can be reached at any hour by calling [phone].   DIET:  We do recommend clear liquids until 3:15 pm.  IF you tolerate that, you may advance to a soft diet for the rest of today.  ACTIVITY:  You should plan to take it easy for the rest of today and you should NOT DRIVE or use heavy machinery until tomorrow (because of the sedation medicines used during the test).    FOLLOW UP: Our staff will call the number listed on your records 48-72 hours following  your procedure to check on you and address any questions or concerns that you may have regarding the information given to you following your procedure. If we do not reach you, we will leave a message.  We will attempt to reach you two times.  During this call, we will ask if you have developed any symptoms of COVID 19. If you develop any symptoms (ie: fever, flu-like symptoms, shortness of breath, cough etc.) before then, please call [phone].  If you test positive for Covid 19 in the 2 weeks post procedure, please call and report this information to [place].    If any biopsies were taken you will be contacted by phone or by letter within the next 1-3 weeks.  Please call us at [phone] if you have not heard about the biopsies in 3 weeks.    SIGNATURES/CONFIDENTIALITY: You and/or your care partner have signed paperwork which will be entered into your electronic medical record.  These signatures attest to the fact that that the information above on your After Visit Summary has been reviewed and is understood.  Full responsibility of the confidentiality of this discharge information lies with you and/or your care-partner.

## 2020-01-04 NOTE — Op Note (Signed)
[facility] Patient Name: Diane Gutierrez Procedure Date: 01/04/2020 1:27 PM MRN: [id number] Endoscopist: Jackquline Denmark , MD Age: 54 Referring MD:  Date of Birth: 1965/12/27 Gender: Female Account #: [id number] Procedure:                Upper GI endoscopy Indications:              Dysphagia, positive celiac screen. Medicines:                Monitored Anesthesia Care Procedure:                Pre-Anesthesia Assessment:                           - Prior to the procedure, a History and Physical                            was performed, and patient medications and                            allergies were reviewed. The patient's tolerance of                            previous anesthesia was also reviewed. The risks                            and benefits of the procedure and the sedation                            options and risks were discussed with the patient.                            All questions were answered, and informed consent                            was obtained. Prior Anticoagulants: The patient has                            taken no previous anticoagulant or antiplatelet                            agents. ASA Grade Assessment: II - A patient with                            mild systemic disease. After reviewing the risks                            and benefits, the patient was deemed in                            satisfactory condition to undergo the procedure.                           After obtaining informed consent, the endoscope was  passed under direct vision. Throughout the                            procedure, the patient's blood pressure, pulse, and                            oxygen saturations were monitored continuously. The                            Endoscope was introduced through the mouth, and                            advanced to the second part of duodenum. The upper                            GI endoscopy was  accomplished without difficulty.                            The patient tolerated the procedure well. Scope In: Scope Out: Findings:                 The esophagus was normal. Transient Schatzki's ring                            was noted at GE junction 33 cm from incisors.                            Multiple biopsies were obtained from proximal, mid                            and distal esophagus for histology to r/o EoE. The                            scope was withdrawn. Dilation was performed with a                            Maloney dilator with mild resistance at 50 Fr.                            Biopsies were taken with a cold forceps for                            histology.                           A 3 cm hiatal hernia was present.                           Localized mild inflammation characterized by                            erythema was found in the gastric antrum. Biopsies  were taken with a cold forceps for histology.                            Estimated blood loss: none.                           8-10, 6 to 8 mm semi-sessile polyps with no                            bleeding and no stigmata of recent bleeding were                            found in the gastric body. 4 polyps were removed                            with a hot snare. Resection and retrieval were                            complete.                           The examined duodenum was normal. Biopsies for                            histology were taken with a cold forceps for                            evaluation of celiac disease. Complications:            No immediate complications. Estimated Blood Loss:     Estimated blood loss: none. Impression:               - Transient Schatzki's Ring s/p esophageal                            dilatation.                           - 3 cm hiatal hernia.                           - Minimal gastritis. Biopsied.                           - A few  gastric polyps. (Resected and retrieved x                            4). Recommendation:           - Patient has a contact number available for                            emergencies. The signs and symptoms of potential                            delayed complications were discussed with the  patient. Return to normal activities tomorrow.                            Written discharge instructions were provided to the                            patient.                           - Post dilatation diet.                           - Continue present medications.                           - Await pathology results.                           - No ibuprofen, naproxen, or other non-steroidal                            anti-inflammatory drugs for 5 days after polyp                            removal.                           - Return to GI clinic in 12 weeks.                           - The findings and recommendations were discussed                            with the patient's family. Jackquline Denmark, MD 01/04/2020 2:10:39 PM This report has been signed electronically.

## 2020-01-04 NOTE — Progress Notes (Signed)
Called to room to assist during endoscopic procedure.  Patient ID and intended procedure confirmed with present staff. Received instructions for my participation in the procedure from the performing physician.  

## 2020-01-06 ENCOUNTER — Telehealth: Payer: Self-pay | Admitting: *Deleted

## 2020-01-06 ENCOUNTER — Telehealth: Payer: Self-pay

## 2020-01-06 NOTE — Telephone Encounter (Signed)
  Follow up Call-  Call back number 01/04/2020  Post procedure Call Back phone  # [phone]  Permission to leave phone message Yes  Some recent data might be hidden     Patient questions:  Do you have a fever, pain , or abdominal swelling? No. Pain Score  0 *  Have you tolerated food without any problems? Yes.  -pt reports some soreness still with swallowing. Encouraged to give another day or so after being dilated to see if that resolves. Instructed to call back if symptoms worsen or if any other problems arise.   Have you been able to return to your normal activities? Yes.    Do you have any questions about your discharge instructions: Diet   No. Medications  No. Follow up visit  No.  Do you have questions or concerns about your Care? No.  Actions: * If pain score is 4 or above: No action needed, pain <4.  1. Have you developed a fever since your procedure? no  2.   Have you had an respiratory symptoms (SOB or cough) since your procedure? no  3.   Have you tested positive for COVID 19 since your procedure no  4.   Have you had any family members/close contacts diagnosed with the COVID 19 since your procedure?  no   If yes to any of these questions please route to Joylene John, RN and Alphonsa Gin, Therapist, sports.

## 2020-01-06 NOTE — Telephone Encounter (Signed)
Attempted to reach pt. With follow-up call following endoscopic procedure 01/04/2020.  LM on pt. Voice mail.  Will try to reach pt. Again later today.

## 2020-01-11 ENCOUNTER — Encounter: Payer: Self-pay | Admitting: Gastroenterology

## 2020-01-16 ENCOUNTER — Telehealth: Payer: Self-pay | Admitting: Gastroenterology

## 2020-01-16 NOTE — Telephone Encounter (Signed)
Patient called requesting bx results

## 2020-01-17 NOTE — Telephone Encounter (Signed)
Called and spoke with patient-given information from letter being sent to her with the patho results; all questions answered; Patient advised to call back to the office at [phone] should questions/concerns arise;  Patient verbalized understanding of information/instructions;

## 2020-01-23 ENCOUNTER — Other Ambulatory Visit: Payer: Self-pay | Admitting: Family Medicine

## 2020-03-07 ENCOUNTER — Encounter: Payer: Self-pay | Admitting: Family Medicine

## 2020-03-09 ENCOUNTER — Other Ambulatory Visit: Payer: Self-pay

## 2020-03-09 ENCOUNTER — Encounter: Payer: Self-pay | Admitting: Family

## 2020-03-09 ENCOUNTER — Ambulatory Visit (INDEPENDENT_AMBULATORY_CARE_PROVIDER_SITE_OTHER): Payer: Medicare Other | Admitting: Family

## 2020-03-09 VITALS — BP 122/70 | HR 70 | Temp 97.4°F | Resp 16 | Ht 65.0 in | Wt 150.0 lb

## 2020-03-09 DIAGNOSIS — L03032 Cellulitis of left toe: Secondary | ICD-10-CM

## 2020-03-09 DIAGNOSIS — L0390 Cellulitis, unspecified: Secondary | ICD-10-CM | POA: Diagnosis not present

## 2020-03-09 MED ORDER — DOXYCYCLINE HYCLATE 100 MG PO TABS
100.0000 mg | ORAL_TABLET | Freq: Two times a day (BID) | ORAL | 0 refills | Status: DC
Start: 2020-03-09 — End: 2020-03-09

## 2020-03-09 MED ORDER — DOXYCYCLINE HYCLATE 100 MG PO TABS: 100.0000 mg | ORAL_TABLET | Freq: Two times a day (BID) | ORAL | 0 refills | Status: DC

## 2020-03-09 NOTE — Patient Instructions (Addendum)
Begin Doxycycline for your skin infection in your toe.   Continue to soak your foot in epsom salts once a day. You should call if symptoms worsen or if symptoms are not improved in 2-3 days.   Paronychia Paronychia is an infection of the skin that surrounds a nail. It usually affects the skin around a fingernail, but it may also occur near a toenail. It often causes pain and swelling around the nail. In some cases, a collection of pus (abscess) can form near or under the nail.  This condition may develop suddenly, or it may develop gradually over a longer period. In most cases, paronychia is not serious, and it will clear up with treatment. What are the causes? This condition may be caused by bacteria or a fungus. These germs can enter the body through an opening in the skin, such as a cut or a hangnail. What increases the risk? This condition is more likely to develop in people who:  Get their hands wet often, such as those who work as Designer, industrial/product, bartenders, or nurses.  Bite their fingernails or suck their thumbs.  Trim their nails very short.  Have hangnails or injured fingertips.  Get manicures.  Have diabetes. What are the signs or symptoms? Symptoms of this condition include:  Redness and swelling of the skin near the nail.  Tenderness around the nail when you touch the area.  Pus-filled bumps under the skin at the base and sides of the nail (cuticle).  Fluid or pus under the nail.  Throbbing pain in the area. How is this diagnosed? This condition is diagnosed with a physical exam. In some cases, a sample of pus may be tested to determine what type of bacteria or fungus is causing the condition. How is this treated? Treatment depends on the cause and severity of your condition. If your condition is mild, it may clear up on its own in a few days or after soaking in warm water. If needed, treatment may include:  Antibiotic medicine, if your infection is caused by  bacteria.  Antifungal medicine, if your infection is caused by a fungus.  A procedure to drain pus from an abscess.  Anti-inflammatory medicine (corticosteroids). Follow these instructions at home: Wound care  Keep the affected area clean.  Soak the affected area in warm water, if told to do so by your health care provider. You may be told to do this for 20 minutes, 2-3 times a day.  Keep the area dry when you are not soaking it.  Do not try to drain an abscess yourself.  Follow instructions from your health care provider about how to take care of the affected area. Make sure you: ? Wash your hands with soap and water before you change your bandage (dressing). If soap and water are not available, use hand sanitizer. ? Change your dressing as told by your health care provider.  If you had an abscess drained, check the area every day for signs of infection. Check for: ? Redness, swelling, or pain. ? Fluid or blood. ? Warmth. ? Pus or a bad smell. Medicines   Take over-the-counter and prescription medicines only as told by your health care provider.  If you were prescribed an antibiotic medicine, take it as told by your health care provider. Do not stop taking the antibiotic even if you start to feel better. General instructions  Avoid contact with harsh chemicals.  Do not pick at the affected area. Prevention  To prevent this  condition from happening again: ? Wear rubber gloves when washing dishes or doing other tasks that require your hands to get wet. ? Wear gloves if your hands might come in contact with cleaners or other chemicals. ? Avoid injuring your nails or fingertips. ? Do not bite your nails or tear hangnails. ? Do not cut your nails very short. ? Do not cut your cuticles. ? Use clean nail clippers or scissors when trimming nails. Contact a health care provider if:  Your symptoms get worse or do not improve with treatment.  You have continued or increased  fluid, blood, or pus coming from the affected area.  Your finger or knuckle becomes swollen or difficult to move. Get help right away if you have:  A fever or chills.  Redness spreading away from the affected area.  Joint or muscle pain. Summary  Paronychia is an infection of the skin that surrounds a nail. It often causes pain and swelling around the nail. In some cases, a collection of pus (abscess) can form near or under the nail.  This condition may be caused by bacteria or a fungus. These germs can enter the body through an opening in the skin, such as a cut or a hangnail.  If your condition is mild, it may clear up on its own in a few days. If needed, treatment may include medicine or a procedure to drain pus from an abscess.  To prevent this condition from happening again, wear gloves if doing tasks that require your hands to get wet or to come in contact with chemicals. Also avoid injuring your nails or fingertips. This information is not intended to replace advice given to you by your health care provider. Make sure you discuss any questions you have with your health care provider. Document Revised: 11/13/2017 Document Reviewed: 11/09/2017 Elsevier Patient Education  2020 [employer].

## 2020-03-09 NOTE — Progress Notes (Signed)
Subjective:    Patient ID: Diane Gutierrez, female    DOB: Dec 19, 1965, 54 y.o.   MRN: [id number]  HPI  Patient is a 54 yr old female who presents today with chief complaint of pain of the left great toe.  She reports that symptoms started 1 week ago.  She reports that the toe is sore and red.  She has been soaking her toe in Epsom salts once daily.   Review of Systems See HPI  Past Medical History:  Diagnosis Date  . Abdominal pain   . Acid reflux   . Anxiety   . Colicky RLQ abdominal pain 07/07/2019  . Fibrocystic breast 07/10/2019  . Hiatal hernia   . Hyperlipidemia   . Leukopenia   . Low back pain   . Lung nodule   . Neck pain   . Palpitations   . Seasonal allergies   . Seizure ([place])    childhood  . Sneezing   . SOB (shortness of breath)   . Tachycardia   . Tachycardia 07/07/2019  . Umbilical hernia   . Weight gain      Social History   Socioeconomic History  . Marital status: Married    Spouse name: Not on file  . Number of children: Not on file  . Years of education: Not on file  . Highest education level: Not on file  Occupational History  . Not on file  Tobacco Use  . Smoking status: Never Smoker  . Smokeless tobacco: Never Used  Substance and Sexual Activity  . Alcohol use: Not Currently  . Drug use: Never  . Sexual activity: Not on file  Other Topics Concern  . Not on file  Social History Narrative   Lives with husband is on disability, previously worked for [employer], no dietary restrictions.    Social Determinants of Health   Financial Resource Strain:   . Difficulty of Paying Living Expenses:   Food Insecurity:   . Worried About Charity fundraiser in the Last Year:   . Arboriculturist in the Last Year:   Transportation Needs:   . Film/video editor (Medical):   Marland Kitchen Lack of Transportation (Non-Medical):   Physical Activity:   . Days of Exercise per Week:   . Minutes of Exercise per Session:   Stress:   . Feeling of  Stress :   Social Connections:   . Frequency of Communication with Friends and Family:   . Frequency of Social Gatherings with Friends and Family:   . Attends Religious Services:   . Active Member of Clubs or Organizations:   . Attends Archivist Meetings:   Marland Kitchen Marital Status:   Intimate Partner Violence:   . Fear of Current or Ex-Partner:   . Emotionally Abused:   Marland Kitchen Physically Abused:   . Sexually Abused:     Past Surgical History:  Procedure Laterality Date  . ABDOMINAL HYSTERECTOMY     precancerous  . BREAST BIOPSY Left   . BREAST SURGERY     lumpectomy, cyst aspirated.on left  . CHOLECYSTECTOMY    . HYSTEROSCOPY DIAGNOSTIC    . JOINT REPLACEMENT Left    knee x 2 with screws in place  . NECK SURGERY     Dr Patrice Paradise    Family History  Problem Relation Age of Onset  . Heart disease Father        s/p CABG  . Diabetes Father   . Hyperlipidemia Father   .  Hypertension Father   . Other Father   . Bipolar disorder Father   . Heart disease Brother   . Sleep apnea Brother   . Arthritis Mother   . Hearing loss Mother   . Heart disease Mother        MI  . Hyperlipidemia Mother   . Thyroid cancer Mother   . Drug abuse Sister        crack cocaine  . Other Sister   . Diabetes Sister        smoker  . Arthritis Maternal Grandmother   . Heart disease Maternal Grandmother   . Arthritis Maternal Grandfather   . Heart disease Maternal Grandfather   . Heart disease Paternal Grandmother   . Cancer Paternal Grandfather   . Heart disease Paternal Grandfather   . Colon cancer Neg Hx   . Esophageal cancer Neg Hx   . Rectal cancer Neg Hx   . Stomach cancer Neg Hx     Allergies  Allergen Reactions  . Prednisone Rash    RASH, severe  . Ibuprofen     GI UPSET    Current Outpatient Medications on File Prior to Visit  Medication Sig Dispense Refill  . acetaminophen (TYLENOL) 325 MG tablet Take 650 mg by mouth every 6 (six) hours as needed.    Marland Kitchen BIOTIN PO Take by  mouth.    . Calcium Carb-Cholecalciferol (CALCIUM CARBONATE-VITAMIN D3 PO) Take by mouth.    . gabapentin (NEURONTIN) 300 MG capsule Take 1 capsule by mouth once daily 90 capsule 0  . Krill Oil 1000 MG CAPS Take 350 mg by mouth.     . metoprolol succinate (TOPROL-XL) 25 MG 24 hr tablet Take 1 tablet (25 mg total) by mouth 2 (two) times daily. Take with or immediately following a meal. 180 tablet 3  . Multiple Vitamin (MULTIVITAMIN) tablet Take 1 tablet by mouth daily.    Marland Kitchen omeprazole (PRILOSEC) 20 MG capsule Take 1 capsule (20 mg total) by mouth daily. 30 capsule 3  . pantoprazole (PROTONIX) 40 MG tablet Take 1 tablet (40 mg total) by mouth daily. 30 tablet 6  . promethazine (PHENERGAN) 12.5 MG tablet Take 12.5 mg by mouth every 6 (six) hours as needed for nausea or vomiting.    . clonazePAM (KLONOPIN) 0.5 MG tablet Take 0.5 mg by mouth 2 (two) times daily as needed for anxiety.    . cyclobenzaprine (FLEXERIL) 10 MG tablet Take 0.5-1 tablets (5-10 mg total) by mouth at bedtime as needed for muscle spasms. (Patient not taking: Reported on 03/09/2020) 90 tablet 1   No current facility-administered medications on file prior to visit.    BP 122/70 (BP Location: Right Arm, Patient Position: Sitting, Cuff Size: Small)   Pulse 70   Temp (!) 97.4 F (36.3 C) (Temporal)   Resp 16   Ht 5\' 5"  (1.651 m)   Wt 150 lb (68 kg)   SpO2 100%   BMI 24.96 kg/m       Objective:   Physical Exam Constitutional:      Appearance: Normal appearance.  Skin:    Comments: Erythema  the left great toenail.  Neurological:     Mental Status: She is alert and oriented to person, place, and time.  Psychiatric:        Mood and Affect: Mood normal.        Behavior: Behavior normal.          Assessment & Plan:  Paronychia/cellulitis-will initiate doxycycline 100  mg twice daily for 7 days.  She is advised to continue to soak her foot in Epsom salts.  She is to let us know if symptoms worsen or if they are not  improved in 2 to 3 days.  This visit occurred during the SARS-CoV-2 public health emergency.  Safety protocols were in place, including screening questions prior to the visit, additional usage of staff PPE, and extensive cleaning of exam room while observing appropriate contact time as indicated for disinfecting solutions.

## 2020-03-15 ENCOUNTER — Encounter: Payer: Self-pay | Admitting: Family

## 2020-03-15 DIAGNOSIS — L03032 Cellulitis of left toe: Secondary | ICD-10-CM

## 2020-03-19 ENCOUNTER — Other Ambulatory Visit: Payer: Self-pay

## 2020-03-19 ENCOUNTER — Ambulatory Visit (INDEPENDENT_AMBULATORY_CARE_PROVIDER_SITE_OTHER): Payer: Medicare Other | Admitting: Podiatry

## 2020-03-19 ENCOUNTER — Ambulatory Visit: Payer: Medicare Other | Admitting: Podiatry

## 2020-03-19 DIAGNOSIS — L600 Ingrowing nail: Secondary | ICD-10-CM | POA: Diagnosis not present

## 2020-03-19 MED ORDER — GENTAMICIN SULFATE 0.1 % EX CREA
1.0000 | TOPICAL_CREAM | Freq: Two times a day (BID) | CUTANEOUS | 1 refills | Status: DC
Start: 2020-03-19 — End: 2020-12-04

## 2020-03-20 ENCOUNTER — Telehealth: Payer: Self-pay | Admitting: *Deleted

## 2020-03-20 NOTE — Telephone Encounter (Signed)
Pt states she had an ingrown procedure yesterday and did not get paperwork after the appt.

## 2020-03-20 NOTE — Telephone Encounter (Signed)
I informed pt she should soak twice daily in 1/4 cup epsom salt in one quart warm water 2 times day for twenty minutes/session and after each soak apply a fabric bandaid with a light coating of gentamicin cream, perform for 2-3 weeks until the area gets a dry hard scab without redness, oozing, swelling or discomfort. I told pt that at the end of the 3rd week perform the last soak of the day, leave off the antibiotic cream bandaid and allow to air dry, if the area gets a dry hard scab without redness, swelling or discomfort may stop the soaking and dressing. If at anytime the surgery site should regress, have more redness, swelling, drainage or cloudy drainage and more pain, fever or red streaks, call our office.

## 2020-03-21 ENCOUNTER — Ambulatory Visit: Payer: Medicare Other | Admitting: Podiatry

## 2020-03-22 NOTE — Progress Notes (Signed)
   Subjective: Patient presents today for evaluation of pain to the lateral border of the left hallux that began about 4 weeks ago. She reports associated redness, swelling and drainage from the area. Patient is concerned for possible ingrown nail. Walking and wearing shoes increases the pain. She has taken an antibiotic and soaked the toe in Epsom salt for treatment. Patient presents today for further treatment and evaluation.  Past Medical History:  Diagnosis Date  . Abdominal pain   . Acid reflux   . Anxiety   . Colicky RLQ abdominal pain 07/07/2019  . Fibrocystic breast 07/10/2019  . Hiatal hernia   . Hyperlipidemia   . Leukopenia   . Low back pain   . Lung nodule   . Neck pain   . Palpitations   . Seasonal allergies   . Seizure ([place])    childhood  . Sneezing   . SOB (shortness of breath)   . Tachycardia   . Tachycardia 07/07/2019  . Umbilical hernia   . Weight gain     Objective:  General: Well developed, nourished, in no acute distress, alert and oriented x3   Dermatology: Skin is warm, dry and supple bilateral. Lateral border of the left great toe appears to be erythematous with evidence of an ingrowing nail. Pain on palpation noted to the border of the nail fold. The remaining nails appear unremarkable at this time. There are no open sores, lesions.  Vascular: Dorsalis Pedis artery and Posterior Tibial artery pedal pulses palpable. No lower extremity edema noted.   Neruologic: Grossly intact via light touch bilateral.  Musculoskeletal: Muscular strength within normal limits in all groups bilateral. Normal range of motion noted to all pedal and ankle joints.   Assesement: #1 Paronychia with ingrowing nail lateral border left great toe #2 Pain in toe #3 Incurvated nail  Plan of Care:  1. Patient evaluated.  2. Discussed treatment alternatives and plan of care. Explained nail avulsion procedure and post procedure course to patient. 3. Patient opted for permanent  partial nail avulsion of the lateral border of the left great toe.  4. Prior to procedure, local anesthesia infiltration utilized using 3 ml of a 50:50 mixture of 2% plain lidocaine and 0.5% plain marcaine in a normal hallux block fashion and a betadine prep performed.  5. Partial permanent nail avulsion with chemical matrixectomy performed using [id number] applications of phenol followed by alcohol flush.  6. Light dressing applied. 7. Prescription for Gentamicin cream provided to patient to use daily with a bandage.  8. Return to clinic in 2 weeks.  Edrick Kins, DPM [facility]  Dr. Edrick Kins, [address]                                        [place], [place] [address]                Office [phone]  Fax [phone]

## 2020-03-28 ENCOUNTER — Ambulatory Visit: Payer: Medicare Other | Admitting: Podiatry

## 2020-04-02 ENCOUNTER — Encounter: Payer: Self-pay | Admitting: Podiatry

## 2020-04-02 ENCOUNTER — Ambulatory Visit (INDEPENDENT_AMBULATORY_CARE_PROVIDER_SITE_OTHER): Payer: Medicare Other | Admitting: Podiatry

## 2020-04-02 ENCOUNTER — Other Ambulatory Visit: Payer: Self-pay

## 2020-04-02 DIAGNOSIS — L600 Ingrowing nail: Secondary | ICD-10-CM

## 2020-04-05 NOTE — Progress Notes (Signed)
   Subjective: 54 y.o. female presents today status post permanent nail avulsion procedure of the lateral border of the left great toe that was performed on 03/19/2020. She reports a burning sensation. She has been cleaning and soaking the toe as directed. She denies any worsening factors. Patient is here for further evaluation and treatment.    Past Medical History:  Diagnosis Date  . Abdominal pain   . Acid reflux   . Anxiety   . Colicky RLQ abdominal pain 07/07/2019  . Fibrocystic breast 07/10/2019  . Hiatal hernia   . Hyperlipidemia   . Leukopenia   . Low back pain   . Lung nodule   . Neck pain   . Palpitations   . Seasonal allergies   . Seizure ([place])    childhood  . Sneezing   . SOB (shortness of breath)   . Tachycardia   . Tachycardia 07/07/2019  . Umbilical hernia   . Weight gain     Objective: Skin is warm, dry and supple. Nail and respective nail fold appears to be healing appropriately. Open wound to the associated nail fold with a granular wound base and moderate amount of fibrotic tissue. Minimal drainage noted. Mild erythema around the periungual region likely due to phenol chemical matricectomy.  Assessment: #1 postop permanent partial nail avulsion lateral border left hallux  #2 open wound periungual nail fold of respective digit.   Plan of care: #1 patient was evaluated  #2 debridement of open wound was performed to the periungual border of the respective toe using a currette. Antibiotic ointment and Band-Aid was applied. #3 patient is to return to clinic on a PRN basis.   Edrick Kins, DPM [facility]  Dr. Edrick Kins, [address]                                        [place], [place] [address]                Office [phone]  Fax [phone]

## 2020-04-12 ENCOUNTER — Other Ambulatory Visit: Payer: Self-pay | Admitting: Family Medicine

## 2020-04-12 ENCOUNTER — Encounter: Payer: Self-pay | Admitting: Podiatry

## 2020-04-12 ENCOUNTER — Encounter: Payer: Self-pay | Admitting: Family Medicine

## 2020-04-12 MED ORDER — PROMETHAZINE HCL 12.5 MG PO TABS: 12.5000 mg | ORAL_TABLET | Freq: Four times a day (QID) | ORAL | 1 refills | Status: DC | PRN

## 2020-04-12 NOTE — Progress Notes (Signed)
Virtual Visit via Telephone Note   This visit type was conducted due to national recommendations for restrictions regarding the COVID-19 Pandemic (e.g. social distancing) in an effort to limit this patient's exposure and mitigate transmission in our community.  Due to her co-morbid illnesses, this patient is at least at moderate risk for complications without adequate follow up.  This format is felt to be most appropriate for this patient at this time.  The patient did not have access to video technology/had technical difficulties with video requiring transitioning to audio format only (telephone).  All issues noted in this document were discussed and addressed.  No physical exam could be performed with this format.  Please refer to the patient's chart for her  consent to telehealth for [facility].   The patient was identified using 2 identifiers.  Date:  04/13/2020   ID:  [name], DOB 09/29/1966, MRN [id number]  Patient Location: Home Provider Location: Office  PCP:  Mosie Lukes, MD  Cardiologist:  Larae Grooms, MD  Electrophysiologist:  None   Evaluation Performed:  Follow-Up Visit  Chief Complaint:  palpitations  History of Present Illness:    Audrena Heydon is a 54 y.o. female with whowasseen in 5/2058for the evaluation of palpitaitonsat the request ofDevyn Walke, PA-C.  Prior records showed: "She had a hysterectomy in 2019, and developed palpitations after the surgery. SHe had a monitor in 3/2019showing only some Very rare PVCs. Avg HR 88.   She was started on a beta blocker.   Echoin 2019showed normal LV function.   She reports some orthopnea and dizziness when she sits up.   She has fatigue. She has had chronic pain as well.   She admits to being "anxious and hyper."  She has had occasional right leg swelling. She has had lymph nodes removed from her groins as well.   Brother died from an arrhythmia, in the setting  of myocarditis presumably. Both parents had heart attacks. Father with CABG in his 71s."  Metoprolol was used for palpitations, but had increased fatigue.  Metoprolol dose was then decreased to 25 mg daily.   However, palpitations increased so metoprolol was increased to 25 mg BID in 06/2019.    At the last visit, it was noted that she does forget the evening dose of metoprolol.  She takes the medicine with food.  She has improved her diet and lost 8 lbs.  We gave her the flexibility to take the second dose based on sx.   CP related to esophageal stricture in the past.   The patient does not have symptoms concerning for COVID-19 infection (fever, chills, cough, or new shortness of breath).   She has had some GI upset with diarrhea over the past few day.  She has some sweating at night as well.    She has had some chest pain.  It can occur with activity or at rest.  It is a dull pain in the center of her chest.  It concerning to her given her family h/o CAD.   She has had some headaches as well.  Se is not sleeping well.    Past Medical History:  Diagnosis Date  . Abdominal pain   . Acid reflux   . Anxiety   . Colicky RLQ abdominal pain 07/07/2019  . Fibrocystic breast 07/10/2019  . Hiatal hernia   . Hyperlipidemia   . Leukopenia   . Low back pain   . Lung nodule   .  Neck pain   . Palpitations   . Seasonal allergies   . Seizure ([place])    childhood  . Sneezing   . SOB (shortness of breath)   . Tachycardia   . Tachycardia 07/07/2019  . Umbilical hernia   . Weight gain    Past Surgical History:  Procedure Laterality Date  . ABDOMINAL HYSTERECTOMY     precancerous  . BREAST BIOPSY Left   . BREAST SURGERY     lumpectomy, cyst aspirated.on left  . CHOLECYSTECTOMY    . HYSTEROSCOPY DIAGNOSTIC    . JOINT REPLACEMENT Left    knee x 2 with screws in place  . NECK SURGERY     Dr Patrice Paradise     Current Meds  Medication Sig  . acetaminophen (TYLENOL) 325 MG tablet Take 650 mg by  mouth every 6 (six) hours as needed.  Marland Kitchen BIOTIN PO Take by mouth.  . Calcium Carb-Cholecalciferol (CALCIUM CARBONATE-VITAMIN D3 PO) Take by mouth.  . clonazePAM (KLONOPIN) 0.5 MG tablet Take 0.5 mg by mouth 2 (two) times daily as needed for anxiety.  Marland Kitchen doxycycline (VIBRA-TABS) 100 MG tablet Take 1 tablet (100 mg total) by mouth 2 (two) times daily.  Marland Kitchen gabapentin (NEURONTIN) 300 MG capsule Take 1 capsule by mouth once daily  . gentamicin cream (GARAMYCIN) 0.1 % Apply 1 application topically 2 (two) times daily.  [name] Oil 1000 MG CAPS Take 350 mg by mouth.   . metoprolol succinate (TOPROL-XL) 25 MG 24 hr tablet Take 1 tablet (25 mg total) by mouth 2 (two) times daily. Take with or immediately following a meal.  . Multiple Vitamin (MULTIVITAMIN) tablet Take 1 tablet by mouth daily.  Marland Kitchen omeprazole (PRILOSEC) 20 MG capsule Take 1 capsule (20 mg total) by mouth daily.  . pantoprazole (PROTONIX) 40 MG tablet Take 1 tablet (40 mg total) by mouth daily.  . promethazine (PHENERGAN) 12.5 MG tablet Take 1 tablet (12.5 mg total) by mouth every 6 (six) hours as needed for nausea or vomiting.     Allergies:   Prednisone and Ibuprofen   Social History   Tobacco Use  . Smoking status: Never Smoker  . Smokeless tobacco: Never Used  Substance Use Topics  . Alcohol use: Not Currently  . Drug use: Never     Family Hx: The patient's family history includes Arthritis in her maternal grandfather, maternal grandmother, and mother; Bipolar disorder in her father; Cancer in her paternal grandfather; Diabetes in her father and sister; Drug abuse in her sister; Hearing loss in her mother; Heart disease in her brother, father, maternal grandfather, maternal grandmother, mother, paternal grandfather, and paternal grandmother; Hyperlipidemia in her father and mother; Hypertension in her father; Other in her father and sister; Sleep apnea in her brother; Thyroid cancer in her mother. There is no history of Colon cancer,  Esophageal cancer, Rectal cancer, or Stomach cancer.  ROS:   Please see the history of present illness.    She has had some diarrhea over the past several days.  All other systems reviewed and are negative.   Prior CV studies:   The following studies were reviewed today:  5/19 echo showed normal LV function  Labs/Other Tests and Data Reviewed:    EKG:  An ECG dated 5/28 was personally reviewed today and demonstrated:  NSR, nonspecific ST changes  Recent Labs: 12/01/2019: ALT 29; BUN 9; Creatinine, Ser 0.93; Hemoglobin 14.0; Platelets 248.0; Potassium 4.1; Sodium 141; TSH 2.13   Recent Lipid Panel Lab Results  Component Value Date/Time   CHOL 218 (H) 07/07/2019 11:02 AM   TRIG 190.0 (H) 07/07/2019 11:02 AM   HDL 37.60 (L) 07/07/2019 11:02 AM   CHOLHDL 6 07/07/2019 11:02 AM   LDLCALC 142 (H) 07/07/2019 11:02 AM    Wt Readings from Last 3 Encounters:  04/13/20 149 lb (67.6 kg)  03/09/20 150 lb (68 kg)  01/04/20 160 lb (72.6 kg)     Objective:    Vital Signs:  BP 107/80   Pulse 78   Ht 5\' 5"  (1.651 m)   Wt 149 lb (67.6 kg)   BMI 24.79 kg/m    VITAL SIGNS:  reviewed GEN:  no acute distress CARDIOVASCULAR:  no SHortness of breath NEURO:  alert and oriented x 3, no obvious focal deficit PSYCH:  normal affect exam limited by phone format  ASSESSMENT & PLAN:    1. Chest pain: recent esophageal stretching.  Sx are getting more frequent.  Will plan for CTA coronaries.  Dull pain that can come on with exertion.  Will call in metoprolol 50 mg x 1 for CTA. 2. Family h/o CAD: Brother with sudden MI.  3. Palpitations: Fairly well controlled. COntinue metoprolol as she has been taking.   COVID-19 Education: The signs and symptoms of COVID-19 were discussed with the patient and how to seek care for testing (follow up with PCP or arrange E-visit).  The importance of social distancing was discussed today.  Time:   Today, I have spent  minutes with the patient with telehealth  technology discussing the above problems.     Medication Adjustments/Labs and Tests Ordered: Current medicines are reviewed at length with the patient today.  Concerns regarding medicines are outlined above.   Tests Ordered: No orders of the defined types were placed in this encounter.   Medication Changes: No orders of the defined types were placed in this encounter.   Follow Up:  Either In Person or Virtual in 1 year(s)  Signed, Larae Grooms, MD  04/13/2020 12:09 PM    [place]

## 2020-04-12 NOTE — Telephone Encounter (Signed)
Called patient and left message to call back and discuss concerns about toe

## 2020-04-12 NOTE — Telephone Encounter (Signed)
I spoke with patient, she voiced her concern regarding not being asked or given any information about the Phenol before procedure.  She said she was not mad and Dr. Amalia Hailey was a good doctor, she was only upset because he only told her about the phenol after procedure was performed and her toe started burning.  She also wanted to know if she could let it air dry now.  I talked with patient and apologized for the miscommunication non education about the procedure and instructed her to not cover her toe any more unless she is going out walking a lot.  I also educated her on any signs/symptoms of infection and to call if any arises.  She was very thankful for my call and discussing the issue with her.

## 2020-04-13 ENCOUNTER — Telehealth (INDEPENDENT_AMBULATORY_CARE_PROVIDER_SITE_OTHER): Payer: Medicare Other | Admitting: Interventional Cardiology

## 2020-04-13 ENCOUNTER — Encounter: Payer: Self-pay | Admitting: Interventional Cardiology

## 2020-04-13 ENCOUNTER — Telehealth: Payer: Self-pay

## 2020-04-13 ENCOUNTER — Telehealth: Payer: Medicare Other | Admitting: Interventional Cardiology

## 2020-04-13 ENCOUNTER — Other Ambulatory Visit: Payer: Self-pay

## 2020-04-13 VITALS — BP 107/80 | HR 78 | Ht 65.0 in | Wt 149.0 lb

## 2020-04-13 DIAGNOSIS — R002 Palpitations: Secondary | ICD-10-CM

## 2020-04-13 DIAGNOSIS — Z8249 Family history of ischemic heart disease and other diseases of the circulatory system: Secondary | ICD-10-CM | POA: Diagnosis not present

## 2020-04-13 DIAGNOSIS — R072 Precordial pain: Secondary | ICD-10-CM

## 2020-04-13 MED ORDER — METOPROLOL TARTRATE 50 MG PO TABS
ORAL_TABLET | ORAL | 0 refills | Status: DC
Start: 2020-04-13 — End: 2022-09-01

## 2020-04-13 NOTE — Patient Instructions (Addendum)
Medication Instructions:  Your physician recommends that you continue on your current medications as directed. Please refer to the Current Medication list given to you today.  *If you need a refill on your cardiac medications before your next appointment, please call your pharmacy*   Lab Work: None today  If you have labs (blood work) drawn today and your tests are completely normal, you will receive your results only by: Marland Kitchen MyChart Message (if you have MyChart) OR . A paper copy in the mail If you have any lab test that is abnormal or we need to change your treatment, we will call you to review the results.   Testing/Procedures: Your physician has requested that you have cardiac CT. Cardiac computed tomography (CT) is a painless test that uses an x-ray machine to take clear, detailed pictures of your heart. For further information please visit [email or web address]. Please follow instruction sheet as given.   Follow-Up: Based on the results  Other Instructions Your cardiac CT will be scheduled at one of the below locations:   [facility] [address] [place], [place] [address] [phone]  [place] [address] [place], [place] [address] [phone]  If scheduled at [facility], please arrive at the [facility] main entrance of [facility] 30 minutes prior to test start time. Proceed to the [facility] Radiology Department (first floor) to check-in and test prep.  If scheduled at [facility], please arrive 15 mins early for check-in and test prep.  Please follow these instructions carefully (unless otherwise directed):   On the Night Before the Test: . Be sure to Drink plenty of water. . Do not consume any caffeinated/decaffeinated beverages or chocolate 12 hours prior to your test. . Do not take any antihistamines 12 hours prior to your test.   On the Day  of the Test: . Drink plenty of water. Do not drink any water within one hour of the test. . Do not eat any food 4 hours prior to the test. . You may take your regular medications prior to the test.  . Take metoprolol tartrate (Lopressor) 50 MG two hours prior to test. This is in addition to your regular dose of metoprolol succinate (Toprol). . FEMALES- please wear underwire-free bra if available     After the Test: . Drink plenty of water. . After receiving IV contrast, you may experience a mild flushed feeling. This is normal. . On occasion, you may experience a mild rash up to 24 hours after the test. This is not dangerous. If this occurs, you can take Benadryl 25 mg and increase your fluid intake. . If you experience trouble breathing, this can be serious. If it is severe call 911 IMMEDIATELY. If it is mild, please call our office.   Once we have confirmed authorization from your insurance company, we will call you to set up a date and time for your test.   For non-scheduling related questions, please contact the cardiac imaging nurse navigator should you have any questions/concerns: Marchia Bond, Cardiac Imaging Nurse Navigator Burley Saver, Interim Cardiac Imaging Nurse [place] [facility] Direct Office Dial: [phone]   For scheduling needs, including cancellations and rescheduling, please call [phone].

## 2020-04-13 NOTE — Telephone Encounter (Signed)
  Patient Consent for Virtual Visit         Diane Gutierrez has provided verbal consent on 04/13/2020 for a virtual visit (video or telephone).   CONSENT FOR VIRTUAL VISIT FOR:  Diane Gutierrez  By participating in this virtual visit I agree to the following:  I hereby voluntarily request, consent and authorize [facility] and its employed or contracted physicians, physician assistants, nurse practitioners or other licensed health care professionals (the Practitioner), to provide me with telemedicine health care services (the "Services") as deemed necessary by the treating Practitioner. I acknowledge and consent to receive the Services by the Practitioner via telemedicine. I understand that the telemedicine visit will involve communicating with the Practitioner through live audiovisual communication technology and the disclosure of certain medical information by electronic transmission. I acknowledge that I have been given the opportunity to request an in-person assessment or other available alternative prior to the telemedicine visit and am voluntarily participating in the telemedicine visit.  I understand that I have the right to withhold or withdraw my consent to the use of telemedicine in the course of my care at any time, without affecting my right to future care or treatment, and that the Practitioner or I may terminate the telemedicine visit at any time. I understand that I have the right to inspect all information obtained and/or recorded in the course of the telemedicine visit and may receive copies of available information for a reasonable fee.  I understand that some of the potential risks of receiving the Services via telemedicine include:  Marland Kitchen Delay or interruption in medical evaluation due to technological equipment failure or disruption; . Information transmitted may not be sufficient (e.g. poor resolution of images) to allow for appropriate medical decision making by the  Practitioner; and/or  . In rare instances, security protocols could fail, causing a breach of personal health information.  Furthermore, I acknowledge that it is my responsibility to provide information about my medical history, conditions and care that is complete and accurate to the best of my ability. I acknowledge that Practitioner's advice, recommendations, and/or decision may be based on factors not within their control, such as incomplete or inaccurate data provided by me or distortions of diagnostic images or specimens that may result from electronic transmissions. I understand that the practice of medicine is not an exact science and that Practitioner makes no warranties or guarantees regarding treatment outcomes. I acknowledge that a copy of this consent can be made available to me via my patient portal ([facility]), or I can request a printed copy by calling the office of [place].    I understand that my insurance will be billed for this visit.   I have read or had this consent read to me. . I understand the contents of this consent, which adequately explains the benefits and risks of the Services being provided via telemedicine.  . I have been provided ample opportunity to ask questions regarding this consent and the Services and have had my questions answered to my satisfaction. . I give my informed consent for the services to be provided through the use of telemedicine in my medical care

## 2020-04-25 ENCOUNTER — Other Ambulatory Visit: Payer: Self-pay | Admitting: Family Medicine

## 2020-04-26 MED ORDER — GABAPENTIN 300 MG PO CAPS: 300.0000 mg | ORAL_CAPSULE | Freq: Every day | ORAL | 0 refills | Status: DC

## 2020-04-28 ENCOUNTER — Other Ambulatory Visit: Payer: Self-pay | Admitting: Family Medicine

## 2020-04-30 ENCOUNTER — Encounter (HOSPITAL_BASED_OUTPATIENT_CLINIC_OR_DEPARTMENT_OTHER): Payer: Self-pay

## 2020-04-30 ENCOUNTER — Emergency Department (HOSPITAL_BASED_OUTPATIENT_CLINIC_OR_DEPARTMENT_OTHER)
Admission: EM | Admit: 2020-04-30 | Discharge: 2020-05-01 | Disposition: A | Discharge status: Home / Self Care | Payer: Medicare Other | Attending: Emergency Medicine | Admitting: Emergency Medicine

## 2020-04-30 ENCOUNTER — Other Ambulatory Visit: Payer: Self-pay

## 2020-04-30 ENCOUNTER — Encounter: Payer: Self-pay | Admitting: Family Medicine

## 2020-04-30 DIAGNOSIS — R1084 Generalized abdominal pain: Secondary | ICD-10-CM | POA: Diagnosis present

## 2020-04-30 DIAGNOSIS — N390 Urinary tract infection, site not specified: Secondary | ICD-10-CM

## 2020-04-30 DIAGNOSIS — R10A2 Flank pain, left side: Secondary | ICD-10-CM

## 2020-04-30 DIAGNOSIS — R109 Unspecified abdominal pain: Secondary | ICD-10-CM

## 2020-04-30 LAB — CBC WITH DIFFERENTIAL/PLATELET
Abs Immature Granulocytes: 0.02 10*3/uL (ref 0.00–0.07)
Basophils Absolute: 0 10*3/uL (ref 0.0–0.1)
Basophils Relative: 0 %
Eosinophils Absolute: 0.2 10*3/uL (ref 0.0–0.5)
Eosinophils Relative: 2 %
HCT: 43.6 % (ref 36.0–46.0)
Hemoglobin: 14.4 g/dL (ref 12.0–15.0)
Immature Granulocytes: 0 %
Lymphocytes Relative: 39 %
Lymphs Abs: 3 10*3/uL (ref 0.7–4.0)
MCH: 30.3 pg (ref 26.0–34.0)
MCHC: 33 g/dL (ref 30.0–36.0)
MCV: 91.8 fL (ref 80.0–100.0)
Monocytes Absolute: 0.5 10*3/uL (ref 0.1–1.0)
Monocytes Relative: 7 %
Neutro Abs: 3.9 10*3/uL (ref 1.7–7.7)
Neutrophils Relative %: 52 %
Platelets: 271 10*3/uL (ref 150–400)
RBC: 4.75 MIL/uL (ref 3.87–5.11)
RDW: 12.2 % (ref 11.5–15.5)
WBC: 7.6 10*3/uL (ref 4.0–10.5)
nRBC: 0 % (ref 0.0–0.2)

## 2020-04-30 LAB — URINALYSIS, ROUTINE W REFLEX MICROSCOPIC
Bilirubin Urine: NEGATIVE
Glucose, UA: NEGATIVE mg/dL
Hgb urine dipstick: NEGATIVE
Ketones, ur: NEGATIVE mg/dL
Nitrite: NEGATIVE
Protein, ur: NEGATIVE mg/dL
Specific Gravity, Urine: <1.005 — ABNORMAL LOW (ref 1.005–1.030)
pH: 6 (ref 5.0–8.0)

## 2020-04-30 LAB — URINALYSIS, MICROSCOPIC (REFLEX): RBC / HPF: NONE SEEN RBC/hpf (ref 0–5)

## 2020-04-30 MED ORDER — ONDANSETRON HCL 4 MG/2ML IJ SOLN
4.0000 mg | Freq: Once | INTRAMUSCULAR | Status: AC
  Administered 2020-05-01: 4 mg via INTRAVENOUS
  Filled 2020-04-30: qty 2

## 2020-04-30 NOTE — Telephone Encounter (Signed)
Called her back and discussed- I do think she needs to be seen at the ER asap.  We went through a fair amount of discussion about this but I continued to advise her to be seen at the ER due to left abd pain and low grade fever

## 2020-04-30 NOTE — ED Triage Notes (Signed)
Pt c/o left flank pain x 3 days-generalized abd pain x 2-NAD-steady gait

## 2020-05-01 ENCOUNTER — Emergency Department (HOSPITAL_BASED_OUTPATIENT_CLINIC_OR_DEPARTMENT_OTHER): Payer: Medicare Other

## 2020-05-01 LAB — BASIC METABOLIC PANEL
Anion gap: 10 (ref 5–15)
BUN: 10 mg/dL (ref 6–20)
CO2: 27 mmol/L (ref 22–32)
Calcium: 9.3 mg/dL (ref 8.9–10.3)
Chloride: 103 mmol/L (ref 98–111)
Creatinine, Ser: 0.88 mg/dL (ref 0.44–1.00)
GFR calc Af Amer: >60 mL/min (ref >60)
GFR calc non Af Amer: >60 mL/min (ref >60)
Glucose, Bld: 100 mg/dL — ABNORMAL HIGH (ref 70–99)
Potassium: 3.8 mmol/L (ref 3.5–5.1)
Sodium: 140 mmol/L (ref 135–145)

## 2020-05-01 MED ORDER — CEPHALEXIN 500 MG PO CAPS
500.0000 mg | ORAL_CAPSULE | Freq: Two times a day (BID) | ORAL | 0 refills | Status: DC
Start: 2020-05-01 — End: 2020-08-07

## 2020-05-01 MED ORDER — SODIUM CHLORIDE 0.9 % IV SOLN
1.0000 g | Freq: Once | INTRAVENOUS | Status: AC
  Administered 2020-05-01: 1 g via INTRAVENOUS
  Filled 2020-05-01: qty 10

## 2020-05-01 MED ORDER — IOHEXOL 300 MG/ML  SOLN
100.0000 mL | Freq: Once | INTRAMUSCULAR | Status: AC | PRN
  Administered 2020-05-01: 100 mL via INTRAVENOUS

## 2020-05-01 MED ORDER — ONDANSETRON 8 MG PO TBDP: 8.0000 mg | ORAL_TABLET | Freq: Three times a day (TID) | ORAL | 0 refills | Status: DC | PRN

## 2020-05-01 MED ORDER — FENTANYL CITRATE (PF) 100 MCG/2ML IJ SOLN
50.0000 ug | Freq: Once | INTRAMUSCULAR | Status: AC
  Administered 2020-05-01: 50 ug via INTRAVENOUS
  Filled 2020-05-01: qty 2

## 2020-05-01 NOTE — ED Provider Notes (Signed)
[facility] Provider Note: Georgena Spurling, MD, FACEP  CSN: [id number] MRN: [id number] ARRIVAL: 04/30/20 at 2220 ROOM: [place]  Flank Pain   HISTORY OF PRESENT ILLNESS  05/01/20 1:32 AM Diane Gutierrez is a 54 y.o. female with a 3-day history of left flank pain and a 2-day history of generalized abdominal pain and lower rib pain.  She rates the pain as a 7, worse with movement or palpation and describes the pain is shooting and intermittent.  She has had associated nausea but no vomiting.  She has controlled her nausea at home with Phenergan and was given Zofran here with improvement.  For about the last week and a half she has had urinary burning and urinary pressure which preceded her other symptoms.  She has had no diarrhea or fever.   Past Medical History:  Diagnosis Date  . Abdominal pain   . Acid reflux   . Anxiety   . Colicky RLQ abdominal pain 07/07/2019  . Fibrocystic breast 07/10/2019  . Hiatal hernia   . Hyperlipidemia   . Leukopenia   . Low back pain   . Lung nodule   . Neck pain   . Palpitations   . Seasonal allergies   . Seizure ([place])    childhood  . Sneezing   . SOB (shortness of breath)   . Tachycardia   . Tachycardia 07/07/2019  . Umbilical hernia   . Weight gain     Past Surgical History:  Procedure Laterality Date  . ABDOMINAL HYSTERECTOMY     precancerous  . BREAST BIOPSY Left   . BREAST SURGERY     lumpectomy, cyst aspirated.on left  . CHOLECYSTECTOMY    . HYSTEROSCOPY DIAGNOSTIC    . JOINT REPLACEMENT Left    knee x 2 with screws in place  . NECK SURGERY     Dr Patrice Paradise    Family History  Problem Relation Age of Onset  . Heart disease Father        s/p CABG  . Diabetes Father   . Hyperlipidemia Father   . Hypertension Father   . Other Father   . Bipolar disorder Father   . Heart disease Brother   . Sleep apnea Brother   . Arthritis Mother   . Hearing loss Mother   . Heart disease Mother         MI  . Hyperlipidemia Mother   . Thyroid cancer Mother   . Drug abuse Sister        crack cocaine  . Other Sister   . Diabetes Sister        smoker  . Arthritis Maternal Grandmother   . Heart disease Maternal Grandmother   . Arthritis Maternal Grandfather   . Heart disease Maternal Grandfather   . Heart disease Paternal Grandmother   . Cancer Paternal Grandfather   . Heart disease Paternal Grandfather   . Colon cancer Neg Hx   . Esophageal cancer Neg Hx   . Rectal cancer Neg Hx   . Stomach cancer Neg Hx     Social History   Tobacco Use  . Smoking status: Never Smoker  . Smokeless tobacco: Never Used  Vaping Use  . Vaping Use: Never used  Substance Use Topics  . Alcohol use: Not Currently  . Drug use: Never    Prior to Admission medications   Medication Sig Start Date End Date Taking? Authorizing Provider  acetaminophen (TYLENOL) 325 MG tablet Take  650 mg by mouth every 6 (six) hours as needed.    [provider]  BIOTIN PO Take by mouth.    [provider]  Calcium Carb-Cholecalciferol (CALCIUM CARBONATE-VITAMIN D3 PO) Take by mouth.    [provider]  clonazePAM (KLONOPIN) 0.5 MG tablet Take 0.5 mg by mouth 2 (two) times daily as needed for anxiety.    [provider]  doxycycline (VIBRA-TABS) 100 MG tablet Take 1 tablet (100 mg total) by mouth 2 (two) times daily. 03/09/20   Debbrah Alar, NP  gabapentin (NEURONTIN) 300 MG capsule Take 1 capsule (300 mg total) by mouth daily. 04/26/20   Mosie Lukes, MD  gentamicin cream (GARAMYCIN) 0.1 % Apply 1 application topically 2 (two) times daily. 03/19/20   Edrick Kins, DPM  Krill Oil 1000 MG CAPS Take 350 mg by mouth.     [provider]  metoprolol succinate (TOPROL-XL) 25 MG 24 hr tablet Take 1 tablet (25 mg total) by mouth 2 (two) times daily. Take with or immediately following a meal. 06/22/19   Jettie Booze, MD  metoprolol tartrate (LOPRESSOR) 50 MG tablet Take 1  tablet by mouth 2 hours prior to Cardiac CT 04/13/20   Jettie Booze, MD  Multiple Vitamin (MULTIVITAMIN) tablet Take 1 tablet by mouth daily.    [provider]  omeprazole (PRILOSEC) 20 MG capsule TAKE ONE CAPSULE BY MOUTH ONE TIME DAILY 04/30/20   Mosie Lukes, MD  pantoprazole (PROTONIX) 40 MG tablet Take 1 tablet (40 mg total) by mouth daily. 11/21/19   Jackquline Denmark, MD  promethazine (PHENERGAN) 12.5 MG tablet Take 1 tablet (12.5 mg total) by mouth every 6 (six) hours as needed for nausea or vomiting. 04/12/20   Mosie Lukes, MD    Allergies Prednisone and Ibuprofen   REVIEW OF SYSTEMS  Negative except as noted here or in the History of Present Illness.   PHYSICAL EXAMINATION  Initial Vital Signs Blood pressure (!) 148/80, pulse 72, temperature 98.4 F (36.9 C), temperature source Oral, resp. rate 16, height 5\' 5"  (1.651 m), weight 68.9 kg, SpO2 99 %.  Examination General: Well-developed, well-nourished female in no acute distress; appearance consistent with age of record HENT: normocephalic; atraumatic Eyes: pupils equal, round and reactive to light; extraocular muscles intact Neck: supple Heart: regular rate and rhythm Lungs: clear to auscultation bilaterally Chest: Bilateral anterior lower rib tenderness Abdomen: soft; nondistended; diffuse tenderness most prominent in the suprapubic region; no masses or hepatosplenomegaly; bowel sounds present Extremities: No deformity; full range of motion; pulses normal Neurologic: Awake, alert and oriented; motor function intact in all extremities and symmetric; no facial droop Skin: Warm and dry Psychiatric: Normal mood and affect   RESULTS  Summary of this visit's results, reviewed and interpreted by myself:   EKG Interpretation  Date/Time:    Ventricular Rate:    PR Interval:    QRS Duration:   QT Interval:    QTC Calculation:   R Axis:     Text Interpretation:        Laboratory Studies: Results for  orders placed or performed during the hospital encounter of 04/30/20 (from the past 24 hour(s))  Urinalysis, Routine w reflex microscopic- may I&O cath if menses     Status: Abnormal   Collection Time: 04/30/20 10:32 PM  Result Value Ref Range   Color, Urine YELLOW YELLOW   APPearance CLEAR CLEAR   Specific Gravity, Urine <1.005 (L) 1.005 - 1.030   pH  6.0 5.0 - 8.0   Glucose, UA NEGATIVE NEGATIVE mg/dL   Hgb urine dipstick NEGATIVE NEGATIVE   Bilirubin Urine NEGATIVE NEGATIVE   Ketones, ur NEGATIVE NEGATIVE mg/dL   Protein, ur NEGATIVE NEGATIVE mg/dL   Nitrite NEGATIVE NEGATIVE   Leukocytes,Ua SMALL (A) NEGATIVE  Urinalysis, Microscopic (reflex)     Status: Abnormal   Collection Time: 04/30/20 10:32 PM  Result Value Ref Range   RBC / HPF NONE SEEN 0 - 5 RBC/hpf   WBC, UA 6-10 0 - 5 WBC/hpf   Bacteria, UA RARE (A) NONE SEEN   Squamous Epithelial / LPF 0-5 0 - 5  Basic metabolic panel     Status: Abnormal   Collection Time: 04/30/20 11:45 PM  Result Value Ref Range   Sodium 140 135 - 145 mmol/L   Potassium 3.8 3.5 - 5.1 mmol/L   Chloride 103 98 - 111 mmol/L   CO2 27 22 - 32 mmol/L   Glucose, Bld 100 (H) 70 - 99 mg/dL   BUN 10 6 - 20 mg/dL   Creatinine, Ser 0.88 0.44 - 1.00 mg/dL   Calcium 9.3 8.9 - 10.3 mg/dL   GFR calc non Af Amer >60 >60 mL/min   GFR calc Af Amer >60 >60 mL/min   Anion gap 10 5 - 15  CBC with Differential     Status: None   Collection Time: 04/30/20 11:45 PM  Result Value Ref Range   WBC 7.6 4.0 - 10.5 K/uL   RBC 4.75 3.87 - 5.11 MIL/uL   Hemoglobin 14.4 12.0 - 15.0 g/dL   HCT 43.6 36 - 46 %   MCV 91.8 80.0 - 100.0 fL   MCH 30.3 26.0 - 34.0 pg   MCHC 33.0 30.0 - 36.0 g/dL   RDW 12.2 11.5 - 15.5 %   Platelets 271 150 - 400 K/uL   nRBC 0.0 0.0 - 0.2 %   Neutrophils Relative % 52 %   Neutro Abs 3.9 1.7 - 7.7 K/uL   Lymphocytes Relative 39 %   Lymphs Abs 3.0 0.7 - 4.0 K/uL   Monocytes Relative 7 %   Monocytes Absolute 0.5 0 - 1 K/uL   Eosinophils  Relative 2 %   Eosinophils Absolute 0.2 0 - 0 K/uL   Basophils Relative 0 %   Basophils Absolute 0.0 0 - 0 K/uL   Immature Granulocytes 0 %   Abs Immature Granulocytes 0.02 0.00 - 0.07 K/uL   Imaging Studies: CT ABDOMEN PELVIS W CONTRAST  Result Date: 05/01/2020 CLINICAL DATA:  Dysuria and abdominal pain EXAM: CT ABDOMEN AND PELVIS WITH CONTRAST TECHNIQUE: Multidetector CT imaging of the abdomen and pelvis was performed using the standard protocol following bolus administration of intravenous contrast. CONTRAST:  147mL OMNIPAQUE IOHEXOL 300 MG/ML  SOLN COMPARISON:  CT 12/05/2019 FINDINGS: Lower chest: Atelectatic changes in the otherwise clear lung bases. Normal heart size. No pericardial effusion. Small hiatal hernia is similar to prior. Hepatobiliary: Diffuse hepatic hypoattenuation compatible with hepatic steatosis. No focal liver abnormality is seen. Patient is post cholecystectomy. No calcified intraductal gallstones or biliary ductal dilatation. Pancreas: Unremarkable. No pancreatic ductal dilatation or surrounding inflammatory changes. Spleen: Normal in size without focal abnormality. Adrenals/Urinary Tract: No concerning adrenal lesions. Kidneys enhance and excrete symmetrically. No worrisome renal lesions. Bilateral extrarenal pelves are similar to prior. No obstructive urolithiasis or hydronephrosis. Urinary bladder is unremarkable. Stomach/Bowel: Small hiatal hernia, as above. Distal stomach and duodenum are unremarkable. No small bowel thickening or dilatation. Mobile appearance of  the cecum, displaced into the left upper quadrant but without evidence of obstruction or volvulus. Normal appendix curls about the displaced cecal tip. No colonic dilatation or wall thickening. Vascular/Lymphatic: Atherosclerotic calcifications throughout the abdominal aorta and branch vessels. No aneurysm or ectasia. No enlarged abdominopelvic lymph nodes. Reproductive: Uterus is surgically absent. No concerning  adnexal lesions. Other: No abdominopelvic free air or fluid. No bowel containing hernias. Small fat containing umbilical hernia. Musculoskeletal: No acute osseous abnormality or suspicious osseous lesion. Multilevel degenerative changes are present in the imaged portions of the spine. Features most pronounced L4-5. mild levocurvature at the thoracolumbar junction. Additional mild degenerative changes in the hips and pelvis. Stable bone island in the left iliac crest. IMPRESSION: 1. No acute CT abnormality to provide cause for patient's acute symptoms. Specifically, no evidence of urolithiasis or other acute urinary tract abnormality. 2. Mobile appearance of the cecum, displaced into the left upper quadrant but without evidence of obstruction or volvulus. Normal appendix curls about the displaced cecal tip. 3. Hepatic steatosis. 4. Small hiatal hernia. 5. Aortic Atherosclerosis (ICD10-I70.0). Electronically Signed   By: Lovena Le M.D.   On: 05/01/2020 03:01    ED COURSE and MDM  Nursing notes, initial and subsequent vitals signs, including pulse oximetry, reviewed and interpreted by myself.  Vitals:   04/30/20 2231 04/30/20 2345 05/01/20 0150  BP: (!) 141/84 (!) 148/80 (!) 138/95  Pulse: 74 72 70  Resp: 18 16 16   Temp: 98.4 F (36.9 C)  98.2 F (36.8 C)  TempSrc: Oral    SpO2: 99% 99% 99%  Weight: 68.9 kg    Height: 5\' 5"  (1.651 m)     Medications  cefTRIAXone (ROCEPHIN) 1 g in sodium chloride 0.9 % 100 mL IVPB (has no administration in time range)  ondansetron (ZOFRAN) injection 4 mg (4 mg Intravenous Given 05/01/20 0005)  fentaNYL (SUBLIMAZE) injection 50 mcg (50 mcg Intravenous Given 05/01/20 0145)  iohexol (OMNIPAQUE) 300 MG/ML solution 100 mL (100 mLs Intravenous Contrast Given 05/01/20 0232)   Urinalysis and patient's initial symptoms are concerning for urinary tract infection and we will treat for this.  Urine has been sent for culture.  She was advised of the reassuring CT otherwise.   She plans to follow-up with her OB/GYN regarding pelvic floor issues which may be contributing to urinary tract infection predilection.   PROCEDURES  Procedures   ED DIAGNOSES     ICD-10-CM   1. Lower urinary tract infectious disease  N39.0   2. Left flank pain  R10.9   3. Generalized abdominal pain  R10.84        Shanon Rosser, MD 05/01/20 [phone]

## 2020-05-02 LAB — URINE CULTURE

## 2020-05-11 ENCOUNTER — Telehealth (HOSPITAL_COMMUNITY): Payer: Self-pay | Admitting: *Deleted

## 2020-05-11 NOTE — Telephone Encounter (Signed)

## 2020-05-14 ENCOUNTER — Encounter: Payer: Self-pay | Admitting: Family Medicine

## 2020-05-15 ENCOUNTER — Ambulatory Visit (HOSPITAL_COMMUNITY)
Admission: RE | Admit: 2020-05-15 | Discharge: 2020-05-15 | Disposition: A | Discharge status: Home / Self Care | Payer: Medicare Other | Source: Ambulatory Visit | Attending: Interventional Cardiology | Admitting: Interventional Cardiology

## 2020-05-15 ENCOUNTER — Encounter (HOSPITAL_COMMUNITY): Payer: Self-pay

## 2020-05-15 ENCOUNTER — Other Ambulatory Visit: Payer: Self-pay

## 2020-05-15 DIAGNOSIS — R072 Precordial pain: Secondary | ICD-10-CM

## 2020-05-15 MED ORDER — IOHEXOL 350 MG/ML SOLN
80.0000 mL | Freq: Once | INTRAVENOUS | Status: AC | PRN
  Administered 2020-05-15: 80 mL via INTRAVENOUS

## 2020-05-15 MED ORDER — NITROGLYCERIN 0.4 MG SL SUBL
0.8000 mg | SUBLINGUAL_TABLET | Freq: Once | SUBLINGUAL | Status: AC
  Administered 2020-05-15: 0.8 mg via SUBLINGUAL

## 2020-05-15 MED ORDER — NITROGLYCERIN 0.4 MG SL SUBL
SUBLINGUAL_TABLET | SUBLINGUAL | Status: AC
  Filled 2020-05-15: qty 2

## 2020-05-15 NOTE — Discharge Instructions (Signed)
Cardiac CT Angiogram A cardiac CT angiogram is a procedure to look at the heart and the area around the heart. It may be done to help find the cause of chest pains or other symptoms of heart disease. During this procedure, a substance called contrast dye is injected into the blood vessels in the area to be checked. A large X-ray machine, called a CT scanner, then takes detailed pictures of the heart and the surrounding area. The procedure is also sometimes called a coronary CT angiogram, coronary artery scanning, or CTA. A cardiac CT angiogram allows the health care provider to see how well blood is flowing to and from the heart. The health care provider will be able to see if there are any problems, such as:  Blockage or narrowing of the coronary arteries in the heart.  Fluid around the heart.  Signs of weakness or disease in the muscles, valves, and tissues of the heart. Tell a health care provider about:  Any allergies you have. This is especially important if you have had a previous allergic reaction to contrast dye.  All medicines you are taking, including vitamins, herbs, eye drops, creams, and over-the-counter medicines.  Any blood disorders you have.  Any surgeries you have had.  Any medical conditions you have.  Whether you are pregnant or may be pregnant.  Any anxiety disorders, chronic pain, or other conditions you have that may increase your stress or prevent you from lying still. What are the risks? Generally, this is a safe procedure. However, problems may occur, including:  Bleeding.  Infection.  Allergic reactions to medicines or dyes.  Damage to other structures or organs.  Kidney damage from the contrast dye that is used.  Increased risk of cancer from radiation exposure. This risk is low. Talk with your health care provider about: ? The risks and benefits of testing. ? How you can receive the lowest dose of radiation. What happens before the  procedure?  Wear comfortable clothing and remove any jewelry, glasses, dentures, and hearing aids.  Follow instructions from your health care provider about eating and drinking. This may include: ? For 12 hours before the procedure -- avoid caffeine. This includes tea, coffee, soda, energy drinks, and diet pills. Drink plenty of water or other fluids that do not have caffeine in them. Being well hydrated can prevent complications. ? For 4-6 hours before the procedure -- stop eating and drinking. The contrast dye can cause nausea, but this is less likely if your stomach is empty.  Ask your health care provider about changing or stopping your regular medicines. This is especially important if you are taking diabetes medicines, blood thinners, or medicines to treat problems with erections (erectile dysfunction). What happens during the procedure?   Hair on your chest may need to be removed so that small sticky patches called electrodes can be placed on your chest. These will transmit information that helps to monitor your heart during the procedure.  An IV will be inserted into one of your veins.  You might be given a medicine to control your heart rate during the procedure. This will help to ensure that good images are obtained.  You will be asked to lie on an exam table. This table will slide in and out of the CT machine during the procedure.  Contrast dye will be injected into the IV. You might feel warm, or you may get a metallic taste in your mouth.  You will be given a medicine called   nitroglycerin. This will relax or dilate the arteries in your heart.  The table that you are lying on will move into the CT machine tunnel for the scan.  The person running the machine will give you instructions while the scans are being done. You may be asked to: ? Keep your arms above your head. ? Hold your breath. ? Stay very still, even if the table is moving.  When the scanning is complete, you  will be moved out of the machine.  The IV will be removed. The procedure may vary among health care providers and hospitals. What can I expect after the procedure? After your procedure, it is common to have:  A metallic taste in your mouth from the contrast dye.  A feeling of warmth.  A headache from the nitroglycerin. Follow these instructions at home:  Take over-the-counter and prescription medicines only as told by your health care provider.  If you are told, drink enough fluid to keep your urine pale yellow. This will help to flush the contrast dye out of your body.  Most people can return to their normal activities right after the procedure. Ask your health care provider what activities are safe for you.  It is up to you to get the results of your procedure. Ask your health care provider, or the department that is doing the procedure, when your results will be ready.  Keep all follow-up visits as told by your health care provider. This is important. Contact a health care provider if:  You have any symptoms of allergy to the contrast dye. These include: ? Shortness of breath. ? Rash or hives. ? A racing heartbeat. Summary  A cardiac CT angiogram is a procedure to look at the heart and the area around the heart. It may be done to help find the cause of chest pains or other symptoms of heart disease.  During this procedure, a large X-ray machine, called a CT scanner, takes detailed pictures of the heart and the surrounding area after a contrast dye has been injected into blood vessels in the area.  Ask your health care provider about changing or stopping your regular medicines before the procedure. This is especially important if you are taking diabetes medicines, blood thinners, or medicines to treat erectile dysfunction.  If you are told, drink enough fluid to keep your urine pale yellow. This will help to flush the contrast dye out of your body. This information is not  intended to replace advice given to you by your health care provider. Make sure you discuss any questions you have with your health care provider. Document Revised: 06/22/2019 Document Reviewed: 06/22/2019 Elsevier Patient Education  2020 [employer] Testing With IV Contrast Material IV contrast material is a fluid that is used with some imaging tests. It is injected into your body through a vein. Contrast material is used when your health care providers need a detailed look at organs, tissues, or blood vessels that may not show up with the standard test. The material may be used when an X-ray, an MRI, a CT scan, or an ultrasound is done. IV contrast material may be used for imaging tests that check:  Muscles, skin, and fat.  Breasts.  Brain.  Digestive tract.  Heart.  Organs such as the liver, kidneys, lungs, bladder, and many others.  Arteries and veins. Tell a health care provider about:  Any allergies you have, especially an allergy to contrast material.  All medicines you are taking, including   metformin, beta blockers, NSAIDs (such as ibuprofen), interleukin-2, vitamins, herbs, eye drops, creams, and over-the-counter medicines.  Any problems you or family members have had with the use of contrast material.  Any blood disorders you have, such as sickle cell anemia.  Any surgeries you have had.  Any medical conditions you have or have had, especially alcohol abuse, dehydration, asthma, or kidney, liver, or heart problems.  Whether you are pregnant or may be pregnant.  Whether you are breastfeeding. Most contrast materials are safe for use in breastfeeding women. What are the risks? Generally, this is a safe procedure. However, problems may occur, including:  Headache.  Itching, skin rash, and hives.  Nausea and vomiting.  Allergic reactions.  Wheezing or difficulty breathing.  Abnormal heart rate.  Changes in blood pressure.  Throat swelling.  Kidney  damage. What happens before the procedure? Medicines Ask your health care provider about:  Changing or stopping your regular medicines. This is especially important if you are taking diabetes medicines or blood thinners.  Taking medicines such as aspirin and ibuprofen. These medicines can thin your blood. Do not take these medicines unless your health care provider tells you to take them.  Taking over-the-counter medicines, vitamins, herbs, and supplements. If you are at risk of having a reaction to the IV contrast material, you may be asked to take medicine before the procedure to prevent a reaction. General instructions  Follow instructions from your health care provider about eating or drinking restrictions.  You may have an exam or lab tests to make sure that you can safely get IV contrast material.  Ask if you will be given a medicine to help you relax (sedative) during the procedure. If so, plan to have someone take you home from the hospital or clinic. What happens during the procedure?  You may be given a sedative to help you relax.  An IV will be inserted into one of your veins.  Contrast material will be injected into your IV.  You may feel warmth or flushing as the contrast material enters your bloodstream.  You may have a metallic taste in your mouth for a few minutes.  The needle may cause some discomfort and bruising.  After the contrast material is in your body, the imaging test will be done. The procedure may vary among health care providers and hospitals. What can I expect after the procedure?  The IV will be removed.  You may be taken to a recovery area if sedation medicines were used. Your blood pressure, heart rate, breathing rate, and blood oxygen level will be monitored until you leave the hospital or clinic. Follow these instructions at home:   Take over-the-counter and prescription medicines only as told by your health care provider. ? Your health  care provider may tell you to not take certain medicines for a couple of days after the procedure. This is especially important if you are taking diabetes medicines.  If you are told, drink enough fluid to keep your urine pale yellow. This will help to remove the contrast material out of your body.  Do not drive for 24 hours if you were given a sedative during your procedure.  It is up to you to get the results of your procedure. Ask your health care provider, or the department that is doing the procedure, when your results will be ready.  Keep all follow-up visits as told by your health care provider. This is important. Contact a health care provider if:    You have redness, swelling, or pain near your IV site. Get help right away if:  You have an abnormal heart rhythm.  You have trouble breathing.  You have: ? Chest pain. ? Pain in your back, neck, arm, jaw, or stomach. ? Nausea or sweating. ? Hives or a rash.  You start shaking and cannot stop. These symptoms may represent a serious problem that is an emergency. Do not wait to see if the symptoms will go away. Get medical help right away. Call your local emergency services (911 in the [place] Do not drive yourself to the hospital. Summary  IV contrast material may be used for imaging tests to help your health care providers see your organs and tissues more clearly.  Tell your health care provider if you are pregnant or may be pregnant.  During the procedure, you may feel warmth or flushing as the contrast material enters your bloodstream.  After the procedure, drink enough fluid to keep your urine pale yellow. This information is not intended to replace advice given to you by your health care provider. Make sure you discuss any questions you have with your health care provider. Document Revised: 01/13/2019 Document Reviewed: 01/13/2019 Elsevier Patient Education  2020 [employer] Inc[employer]  

## 2020-05-16 ENCOUNTER — Other Ambulatory Visit: Payer: Self-pay | Admitting: *Deleted

## 2020-05-16 DIAGNOSIS — R109 Unspecified abdominal pain: Secondary | ICD-10-CM

## 2020-05-16 NOTE — Telephone Encounter (Signed)
Left message on machine to call back to schedule lab only and follow up appointment.

## 2020-05-25 ENCOUNTER — Ambulatory Visit: Payer: Medicare Other | Admitting: Family

## 2020-07-23 ENCOUNTER — Other Ambulatory Visit: Payer: Self-pay | Admitting: Family Medicine

## 2020-08-07 ENCOUNTER — Telehealth (INDEPENDENT_AMBULATORY_CARE_PROVIDER_SITE_OTHER): Payer: Medicare Other | Admitting: Family Medicine

## 2020-08-07 ENCOUNTER — Encounter: Payer: Self-pay | Admitting: Family Medicine

## 2020-08-07 ENCOUNTER — Other Ambulatory Visit: Payer: Self-pay

## 2020-08-07 VITALS — BP 105/76 | HR 99 | Temp 99.2°F | Wt 145.0 lb

## 2020-08-07 DIAGNOSIS — N6320 Unspecified lump in the left breast, unspecified quadrant: Secondary | ICD-10-CM

## 2020-08-07 DIAGNOSIS — K581 Irritable bowel syndrome with constipation: Secondary | ICD-10-CM | POA: Diagnosis not present

## 2020-08-07 DIAGNOSIS — R350 Frequency of micturition: Secondary | ICD-10-CM

## 2020-08-07 DIAGNOSIS — N644 Mastodynia: Secondary | ICD-10-CM

## 2020-08-07 DIAGNOSIS — R000 Tachycardia, unspecified: Secondary | ICD-10-CM | POA: Diagnosis not present

## 2020-08-07 DIAGNOSIS — M545 Low back pain, unspecified: Secondary | ICD-10-CM

## 2020-08-07 DIAGNOSIS — E785 Hyperlipidemia, unspecified: Secondary | ICD-10-CM | POA: Diagnosis not present

## 2020-08-07 DIAGNOSIS — N6019 Diffuse cystic mastopathy of unspecified breast: Secondary | ICD-10-CM

## 2020-08-07 DIAGNOSIS — K219 Gastro-esophageal reflux disease without esophagitis: Secondary | ICD-10-CM

## 2020-08-07 MED ORDER — OMEPRAZOLE 20 MG PO CPDR: 20.0000 mg | DELAYED_RELEASE_CAPSULE | Freq: Every day | ORAL | 2 refills | Status: DC

## 2020-08-07 MED ORDER — GABAPENTIN 300 MG PO CAPS: 300.0000 mg | ORAL_CAPSULE | Freq: Every day | ORAL | 2 refills | Status: DC

## 2020-08-07 NOTE — Assessment & Plan Note (Signed)
Encouraged heart healthy diet, increase exercise, avoid trans fats, consider a krill oil cap daily 

## 2020-08-08 ENCOUNTER — Other Ambulatory Visit: Payer: Self-pay | Admitting: Family Medicine

## 2020-08-08 DIAGNOSIS — R350 Frequency of micturition: Secondary | ICD-10-CM | POA: Insufficient documentation

## 2020-08-08 DIAGNOSIS — N6320 Unspecified lump in the left breast, unspecified quadrant: Secondary | ICD-10-CM

## 2020-08-08 DIAGNOSIS — N644 Mastodynia: Secondary | ICD-10-CM

## 2020-08-08 NOTE — Assessment & Plan Note (Signed)
Left breast lesion and some discomfort. Diagnostic mgm bilateral and [place] for left breast ordered

## 2020-08-08 NOTE — Assessment & Plan Note (Signed)
Check urinalysis and urine culture 

## 2020-08-08 NOTE — Progress Notes (Signed)
Virtual Visit via phone Note  I connected with Diane Gutierrez on 08/07/20 at  2:00 PM EDT by a phone enabled telemedicine application and verified that I am speaking with the correct person using two identifiers.  Location: Patient: home, patient and provider are in visit Provider: office   I discussed the limitations of evaluation and management by telemedicine and the availability of in person appointments. The patient expressed understanding and agreed to proceed. Nani Skillern, CMA was able to get the patient set up on a phone visit after being unable to set up a video visit    Subjective:    Patient ID: Diane Gutierrez, female    DOB: 03-May-1966, 54 y.o.   MRN: [id number]  Chief Complaint  Patient presents with  . Follow-up    liver    HPI Patient is in today for follow up on chronic medical concerns. No recent febrile illness or hospitalizations. No polydipsia but is noting some urinary frequency. No hematuria or dysuria. Heartburn improved and using Omeprazole prn with good relief. She is noting some bilateral breast tenderness. Denies CP/palp/SOB/HA/congestion/fevers/GI c/o. Taking meds as prescribed  Past Medical History:  Diagnosis Date  . Abdominal pain   . Acid reflux   . Anxiety   . Colicky RLQ abdominal pain 07/07/2019  . Fibrocystic breast 07/10/2019  . Hiatal hernia   . Hyperlipidemia   . Leukopenia   . Low back pain   . Lung nodule   . Neck pain   . Palpitations   . Seasonal allergies   . Seizure ([place])    childhood  . Sneezing   . SOB (shortness of breath)   . Tachycardia   . Tachycardia 07/07/2019  . Umbilical hernia   . Weight gain     Past Surgical History:  Procedure Laterality Date  . ABDOMINAL HYSTERECTOMY     precancerous  . BREAST BIOPSY Left   . BREAST SURGERY     lumpectomy, cyst aspirated.on left  . CHOLECYSTECTOMY    . HYSTEROSCOPY DIAGNOSTIC    . JOINT REPLACEMENT Left    knee x 2 with screws in place  . NECK SURGERY     Dr  Patrice Paradise    Family History  Problem Relation Age of Onset  . Heart disease Father        s/p CABG  . Diabetes Father   . Hyperlipidemia Father   . Hypertension Father   . Other Father   . Bipolar disorder Father   . Heart disease Brother   . Sleep apnea Brother   . Arthritis Mother   . Hearing loss Mother   . Heart disease Mother        MI  . Hyperlipidemia Mother   . Thyroid cancer Mother   . Drug abuse Sister        crack cocaine  . Other Sister   . Diabetes Sister        smoker  . Arthritis Maternal Grandmother   . Heart disease Maternal Grandmother   . Arthritis Maternal Grandfather   . Heart disease Maternal Grandfather   . Heart disease Paternal Grandmother   . Cancer Paternal Grandfather   . Heart disease Paternal Grandfather   . Colon cancer Neg Hx   . Esophageal cancer Neg Hx   . Rectal cancer Neg Hx   . Stomach cancer Neg Hx     Social History   Socioeconomic History  . Marital status: Married    Spouse name: Not  on file  . Number of children: Not on file  . Years of education: Not on file  . Highest education level: Not on file  Occupational History  . Not on file  Tobacco Use  . Smoking status: Never Smoker  . Smokeless tobacco: Never Used  Vaping Use  . Vaping Use: Never used  Substance and Sexual Activity  . Alcohol use: Not Currently  . Drug use: Never  . Sexual activity: Not on file  Other Topics Concern  . Not on file  Social History Narrative   Lives with husband is on disability, previously worked for [employer], no dietary restrictions.    Social Determinants of Health   Financial Resource Strain:   . Difficulty of Paying Living Expenses: Not on file  Food Insecurity:   . Worried About Charity fundraiser in the Last Year: Not on file  . Ran Out of Food in the Last Year: Not on file  Transportation Needs:   . Lack of Transportation (Medical): Not on file  . Lack of Transportation (Non-Medical): Not on file  Physical  Activity:   . Days of Exercise per Week: Not on file  . Minutes of Exercise per Session: Not on file  Stress:   . Feeling of Stress : Not on file  Social Connections:   . Frequency of Communication with Friends and Family: Not on file  . Frequency of Social Gatherings with Friends and Family: Not on file  . Attends Religious Services: Not on file  . Active Member of Clubs or Organizations: Not on file  . Attends Archivist Meetings: Not on file  . Marital Status: Not on file  Intimate Partner Violence:   . Fear of Current or Ex-Partner: Not on file  . Emotionally Abused: Not on file  . Physically Abused: Not on file  . Sexually Abused: Not on file    Outpatient Medications Prior to Visit  Medication Sig Dispense Refill  . BIOTIN PO Take by mouth.    . Calcium Carb-Cholecalciferol (CALCIUM CARBONATE-VITAMIN D3 PO) Take by mouth.    Marland Kitchen gentamicin cream (GARAMYCIN) 0.1 % Apply 1 application topically 2 (two) times daily. 15 g 1  . Krill Oil 1000 MG CAPS Take 350 mg by mouth.     . metoprolol succinate (TOPROL-XL) 25 MG 24 hr tablet Take 1 tablet (25 mg total) by mouth 2 (two) times daily. Take with or immediately following a meal. 180 tablet 3  . Multiple Vitamin (MULTIVITAMIN) tablet Take 1 tablet by mouth daily.    . promethazine (PHENERGAN) 12.5 MG tablet Take 1 tablet (12.5 mg total) by mouth every 6 (six) hours as needed for nausea or vomiting. 30 tablet 1  . cephALEXin (KEFLEX) 500 MG capsule Take 1 capsule (500 mg total) by mouth 2 (two) times daily. (Patient not taking: Reported on 08/07/2020) 10 capsule 0  . clonazePAM (KLONOPIN) 0.5 MG tablet Take 0.5 mg by mouth 2 (two) times daily as needed for anxiety.    . gabapentin (NEURONTIN) 300 MG capsule Take 1 capsule (300 mg total) by mouth daily. 90 capsule 0  . omeprazole (PRILOSEC) 20 MG capsule TAKE ONE CAPSULE BY MOUTH ONE TIME DAILY 30 capsule 0  . ondansetron (ZOFRAN ODT) 8 MG disintegrating tablet Take 1 tablet (8 mg  total) by mouth every 8 (eight) hours as needed for nausea or vomiting. 10 tablet 0  . pantoprazole (PROTONIX) 40 MG tablet Take 1 tablet (40 mg total) by mouth  daily. (Patient not taking: Reported on 08/07/2020) 30 tablet 6   No facility-administered medications prior to visit.    Allergies  Allergen Reactions  . Prednisone Rash    RASH, severe  . Ibuprofen     GI UPSET    Review of Systems  Constitutional: Negative for fever and malaise/fatigue.  HENT: Negative for congestion.   Eyes: Negative for blurred vision.  Respiratory: Negative for shortness of breath.   Cardiovascular: Negative for chest pain, palpitations and leg swelling.  Gastrointestinal: Positive for heartburn. Negative for abdominal pain, blood in stool and nausea.  Genitourinary: Positive for frequency and urgency. Negative for dysuria and hematuria.  Musculoskeletal: Negative for falls.  Skin: Negative for rash.  Neurological: Negative for dizziness, loss of consciousness and headaches.  Endo/Heme/Allergies: Negative for environmental allergies.  Psychiatric/Behavioral: Negative for depression. The patient is not nervous/anxious.        Objective:    Physical Exam  Unable to obtain via phone BP 105/76 (BP Location: Left Arm, Patient Position: Sitting, Cuff Size: Normal)   Pulse 99   Temp 99.2 F (37.3 C)   Wt 145 lb (65.8 kg)   SpO2 99%   BMI 24.13 kg/m  Wt Readings from Last 3 Encounters:  08/07/20 145 lb (65.8 kg)  04/30/20 152 lb (68.9 kg)  04/13/20 149 lb (67.6 kg)    Diabetic Foot Exam - Simple   No data filed     Lab Results  Component Value Date   WBC 7.6 04/30/2020   HGB 14.4 04/30/2020   HCT 43.6 04/30/2020   PLT 271 04/30/2020   GLUCOSE 100 (H) 04/30/2020   CHOL 218 (H) 07/07/2019   TRIG 190.0 (H) 07/07/2019   HDL 37.60 (L) 07/07/2019   LDLCALC 142 (H) 07/07/2019   ALT 29 12/01/2019   AST 26 12/01/2019   NA 140 04/30/2020   K 3.8 04/30/2020   CL 103 04/30/2020   CREATININE  0.88 04/30/2020   BUN 10 04/30/2020   CO2 27 04/30/2020   TSH 2.13 12/01/2019    Lab Results  Component Value Date   TSH 2.13 12/01/2019   Lab Results  Component Value Date   WBC 7.6 04/30/2020   HGB 14.4 04/30/2020   HCT 43.6 04/30/2020   MCV 91.8 04/30/2020   PLT 271 04/30/2020   Lab Results  Component Value Date   NA 140 04/30/2020   K 3.8 04/30/2020   CO2 27 04/30/2020   GLUCOSE 100 (H) 04/30/2020   BUN 10 04/30/2020   CREATININE 0.88 04/30/2020   BILITOT 0.5 12/01/2019   ALKPHOS 89 12/01/2019   AST 26 12/01/2019   ALT 29 12/01/2019   PROT 7.0 12/01/2019   ALBUMIN 4.2 12/01/2019   CALCIUM 9.3 04/30/2020   ANIONGAP 10 04/30/2020   GFR 62.90 12/01/2019   Lab Results  Component Value Date   CHOL 218 (H) 07/07/2019   Lab Results  Component Value Date   HDL 37.60 (L) 07/07/2019   Lab Results  Component Value Date   LDLCALC 142 (H) 07/07/2019   Lab Results  Component Value Date   TRIG 190.0 (H) 07/07/2019   Lab Results  Component Value Date   CHOLHDL 6 07/07/2019   No results found for: HGBA1C     Assessment & Plan:   Problem List Items Addressed This Visit    Tachycardia - Primary   Relevant Orders   CBC   TSH   Hyperlipidemia    Encouraged heart healthy diet, increase exercise,  avoid trans fats, consider a krill oil cap daily      Relevant Orders   Comprehensive metabolic panel   Lipid panel   Low back pain    Gabapentin has been helpful. Refill given today      Acid reflux    Avoid offending foods, start probiotics. Do not eat large meals in late evening and consider raising head of bed. Is following with Dr Lyndel Safe. Given refill on Omeprazole prn to use      Relevant Medications   omeprazole (PRILOSEC) 20 MG capsule   IBS (irritable bowel syndrome)   Relevant Medications   omeprazole (PRILOSEC) 20 MG capsule   Other Relevant Orders   CBC   Fibrocystic breast    Left breast lesion and some discomfort. Diagnostic mgm bilateral and  [place] for left breast ordered      Urinary frequency    Check urinalysis and urine culture      Relevant Orders   Urinalysis   Urine Culture    Other Visit Diagnoses    Painful breasts       Breast mass, left          I have discontinued Basya L. Hambly "Kim"'s clonazePAM, pantoprazole, ondansetron, and cephALEXin. I have also changed her omeprazole. Additionally, I am having her maintain her BIOTIN PO, Calcium Carb-Cholecalciferol (CALCIUM CARBONATE-VITAMIN D3 PO), multivitamin, metoprolol succinate, Krill Oil, gentamicin cream, promethazine, and gabapentin.  Meds ordered this encounter  Medications  . gabapentin (NEURONTIN) 300 MG capsule    Sig: Take 1 capsule (300 mg total) by mouth daily.    Dispense:  90 capsule    Refill:  2  . omeprazole (PRILOSEC) 20 MG capsule    Sig: Take 1 capsule (20 mg total) by mouth daily.    Dispense:  90 capsule    Refill:  2    Need ov     I discussed the assessment and treatment plan with the patient. The patient was provided an opportunity to ask questions and all were answered. The patient agreed with the plan and demonstrated an understanding of the instructions.   The patient was advised to call back or seek an in-person evaluation if the symptoms worsen or if the condition fails to improve as anticipated.  I provided 25 minutes of non-face-to-face time during this encounter.   Penni Homans, MD

## 2020-08-08 NOTE — Assessment & Plan Note (Signed)
Gabapentin has been helpful. Refill given today

## 2020-08-08 NOTE — Assessment & Plan Note (Addendum)
Avoid offending foods, start probiotics. Do not eat large meals in late evening and consider raising head of bed. Is following with Dr Lyndel Safe. Given refill on Omeprazole prn to use

## 2020-08-16 ENCOUNTER — Other Ambulatory Visit: Payer: Self-pay

## 2020-08-16 ENCOUNTER — Other Ambulatory Visit (INDEPENDENT_AMBULATORY_CARE_PROVIDER_SITE_OTHER): Payer: Medicare Other

## 2020-08-16 ENCOUNTER — Other Ambulatory Visit: Payer: Medicare Other

## 2020-08-16 DIAGNOSIS — K581 Irritable bowel syndrome with constipation: Secondary | ICD-10-CM

## 2020-08-16 DIAGNOSIS — E785 Hyperlipidemia, unspecified: Secondary | ICD-10-CM

## 2020-08-16 DIAGNOSIS — R000 Tachycardia, unspecified: Secondary | ICD-10-CM

## 2020-08-16 DIAGNOSIS — R350 Frequency of micturition: Secondary | ICD-10-CM | POA: Diagnosis not present

## 2020-08-17 ENCOUNTER — Telehealth: Payer: Self-pay | Admitting: Family Medicine

## 2020-08-17 LAB — CBC
HCT: 46.6 % — ABNORMAL HIGH (ref 35.0–45.0)
Hemoglobin: 15.9 g/dL — ABNORMAL HIGH (ref 11.7–15.5)
MCH: 30.6 pg (ref 27.0–33.0)
MCHC: 34.1 g/dL (ref 32.0–36.0)
MCV: 89.6 fL (ref 80.0–100.0)
MPV: 10.3 fL (ref 7.5–12.5)
Platelets: 255 10*3/uL (ref 140–400)
RBC: 5.2 10*6/uL — ABNORMAL HIGH (ref 3.80–5.10)
RDW: 12.5 % (ref 11.0–15.0)
WBC: 7.1 10*3/uL (ref 3.8–10.8)

## 2020-08-17 LAB — URINALYSIS
Bilirubin Urine: NEGATIVE
Glucose, UA: NEGATIVE
Hgb urine dipstick: NEGATIVE
Ketones, ur: NEGATIVE
Nitrite: NEGATIVE
Protein, ur: NEGATIVE
Specific Gravity, Urine: 1.005 (ref 1.001–1.03)
pH: 7 (ref 5.0–8.0)

## 2020-08-17 LAB — COMPREHENSIVE METABOLIC PANEL
AG Ratio: 1.6 (calc) (ref 1.0–2.5)
ALT: 20 U/L (ref 6–29)
AST: 21 U/L (ref 10–35)
Albumin: 4.5 g/dL (ref 3.6–5.1)
Alkaline phosphatase (APISO): 86 U/L (ref 37–153)
BUN: 13 mg/dL (ref 7–25)
CO2: 24 mmol/L (ref 20–32)
Calcium: 10.2 mg/dL (ref 8.6–10.4)
Chloride: 102 mmol/L (ref 98–110)
Creat: 1.02 mg/dL (ref 0.50–1.05)
Globulin: 2.9 g/dL (calc) (ref 1.9–3.7)
Glucose, Bld: 97 mg/dL (ref 65–99)
Potassium: 4.2 mmol/L (ref 3.5–5.3)
Sodium: 140 mmol/L (ref 135–146)
Total Bilirubin: 0.7 mg/dL (ref 0.2–1.2)
Total Protein: 7.4 g/dL (ref 6.1–8.1)

## 2020-08-17 LAB — URINE CULTURE
MICRO NUMBER:: 11044455
SPECIMEN QUALITY:: ADEQUATE

## 2020-08-17 LAB — LIPID PANEL
Cholesterol: 252 mg/dL — ABNORMAL HIGH (ref <200)
HDL: 43 mg/dL — ABNORMAL LOW (ref >=50)
LDL Cholesterol (Calc): 159 mg/dL (calc) — ABNORMAL HIGH
Non-HDL Cholesterol (Calc): 209 mg/dL (calc) — ABNORMAL HIGH (ref <130)
Total CHOL/HDL Ratio: 5.9 (calc) — ABNORMAL HIGH (ref <5.0)
Triglycerides: 324 mg/dL — ABNORMAL HIGH (ref <150)

## 2020-08-17 LAB — TSH: TSH: 2.53 mIU/L

## 2020-08-17 NOTE — Telephone Encounter (Signed)
See lab result note for response to labs

## 2020-08-17 NOTE — Telephone Encounter (Signed)
Called pt spoke with regarding lab results is unsure why labs are so high still will research atorvastatin. Does not like to takre meds will work on diet more. Will also get labs done in 3 months and follow up with pcp after diet and labs to see how her numbers have improved

## 2020-08-17 NOTE — Telephone Encounter (Signed)
Caller Simran Bomkamp Call Back # [phone]  Patient states she would like a call back in reference to lab results.

## 2020-08-17 NOTE — Progress Notes (Signed)
Pt called informed of providers response. Pt questions f/u to colonoscopy since father has been dx with stage 3   Will research med and decide will send [place] a mychart message will get back to [place] with response.

## 2020-08-20 ENCOUNTER — Encounter: Payer: Self-pay | Admitting: Family Medicine

## 2020-08-20 ENCOUNTER — Other Ambulatory Visit: Payer: Self-pay | Admitting: Family Medicine

## 2020-08-20 MED ORDER — OMEPRAZOLE 20 MG PO CPDR: 20.0000 mg | DELAYED_RELEASE_CAPSULE | Freq: Every day | ORAL | 1 refills | Status: DC

## 2020-09-04 ENCOUNTER — Other Ambulatory Visit: Payer: Self-pay

## 2020-09-04 ENCOUNTER — Ambulatory Visit
Admission: RE | Admit: 2020-09-04 | Discharge: 2020-09-04 | Disposition: A | Discharge status: Home / Self Care | Payer: Medicare Other | Source: Ambulatory Visit | Attending: Family Medicine | Admitting: Family Medicine

## 2020-09-04 ENCOUNTER — Other Ambulatory Visit: Payer: Self-pay | Admitting: Family Medicine

## 2020-09-04 DIAGNOSIS — N6320 Unspecified lump in the left breast, unspecified quadrant: Secondary | ICD-10-CM

## 2020-09-04 DIAGNOSIS — N644 Mastodynia: Secondary | ICD-10-CM

## 2020-10-24 ENCOUNTER — Other Ambulatory Visit: Payer: Self-pay | Admitting: Interventional Cardiology

## 2020-12-03 ENCOUNTER — Telehealth: Payer: Self-pay | Admitting: Interventional Cardiology

## 2020-12-03 NOTE — Progress Notes (Signed)
Cardiology Office Note   Date:  12/04/2020   ID:  Diane Gutierrez, [place] May 11, 1966, MRN [id number]  PCP:  Diane Lukes, MD    No chief complaint on file.    Wt Readings from Last 3 Encounters:  12/04/20 140 lb 12.8 oz (63.9 kg)  08/07/20 145 lb (65.8 kg)  04/30/20 152 lb (68.9 kg)       History of Present Illness: Diane Gutierrez is a 55 y.o. female  whowasseen in 5/2081for the evaluation of palpitaitonsat the request ofDevyn Walke, PA-C.  Prior records showed: "She had a hysterectomy in 2019, and developed palpitations after the surgery. SHe had a monitor in 3/2019showing only some Very rare PVCs. Avg HR 88.   She was started on a beta blocker.   Echoin 2019showed normal LV function.   She reports some orthopnea and dizziness when she sits up.   She has fatigue. She has had chronic pain as well.   She admits to being "anxious and hyper."  Shehas hadoccasional right leg swelling. She has had lymph nodes removed from her groins as well.   Brother died from an arrhythmia, in the setting of myocarditis presumably. Both parents had heart attacks. Father with CABG in his 75s."  Metoprolol was used for palpitations, but had increased fatigue. Metoprolol dose was then decreased to 25 mg daily. However, palpitations increased so metoprolol was increased to 25 mg BID in 06/2019.   Coronary CTA in 2021: "No evidence of CAD, CADRADS = 0.  2. Coronary calcium score of 0. This was 0 percentile for age and sex matched control.  3. Normal coronary origin with right dominance."  Patient called in: "she reports that she has been having sharp shooting pain on the left side of her chest. This started this past Saturday but has been worsening on Sunday 12/02/20... she says it comes and goes and happens at rest and with exertion. She says she has been having some anxiety/ dizziness with it and she has to cool off with a cool rag to help her  feel better. She has had some GI upset but she is seeing an MD for it in the near future. "  She has had a lot of stress recently.  Mom was sick in 11/21 and dog was sick as well.  She has had more PVCs.  Some sharp pains when she moves a certain way.  Not related to exertion.  She walks 7000-9000 steps daily and keeps up with that activity well.   She has had some stomach upset and diarrhea recently.   Past Medical History:  Diagnosis Date  . Abdominal pain   . Acid reflux   . Anxiety   . Colicky RLQ abdominal pain 07/07/2019  . Fibrocystic breast 07/10/2019  . Hiatal hernia   . Hyperlipidemia   . Leukopenia   . Low back pain   . Lung nodule   . Neck pain   . Palpitations   . Seasonal allergies   . Seizure ([place])    childhood  . Sneezing   . SOB (shortness of breath)   . Tachycardia   . Tachycardia 07/07/2019  . Umbilical hernia   . Weight gain     Past Surgical History:  Procedure Laterality Date  . ABDOMINAL HYSTERECTOMY     precancerous  . BREAST BIOPSY Left   . BREAST SURGERY     lumpectomy, cyst aspirated.on left  . CHOLECYSTECTOMY    . HYSTEROSCOPY DIAGNOSTIC    .  JOINT REPLACEMENT Left    knee x 2 with screws in place  . NECK SURGERY     Dr Patrice Paradise     Current Outpatient Medications  Medication Sig Dispense Refill  . acetaminophen (TYLENOL) 500 MG tablet Take 500 mg by mouth every 6 (six) hours as needed.    Marland Kitchen BIOTIN PO Take by mouth.    . Cholecalciferol (VITAMIN D3) 1.25 MG (50000 UT) TABS Take by mouth.    . gabapentin (NEURONTIN) 300 MG capsule Take 1 capsule (300 mg total) by mouth daily. 90 capsule 2  . ipratropium (ATROVENT) 0.06 % nasal spray Place into the nose.    Diane Gutierrez 1000 MG CAPS Take 350 mg by mouth.     . metoprolol succinate (TOPROL-XL) 25 MG 24 hr tablet Take 1 tablet (25 mg total) by mouth daily. Take with or immediately following a meal. 180 tablet 1  . Multiple Vitamin (MULTIVITAMIN) tablet Take 1 tablet by mouth daily.    .  Multiple Vitamin (MULTIVITAMIN) tablet Take 1 tablet by mouth daily.    Marland Kitchen omeprazole (PRILOSEC) 20 MG capsule Take 1 capsule (20 mg total) by mouth daily. 90 capsule 1  . promethazine (PHENERGAN) 12.5 MG tablet Take 1 tablet (12.5 mg total) by mouth every 6 (six) hours as needed for nausea or vomiting. 30 tablet 1   No current facility-administered medications for this visit.    Allergies:   Prednisone and Ibuprofen    Social History:  The patient  reports that she has never smoked. She has never used smokeless tobacco. She reports previous alcohol use. She reports that she does not use drugs.   Family History:  The patient's family history includes Arthritis in her maternal grandfather, maternal grandmother, and mother; Bipolar disorder in her father; Cancer in her paternal grandfather; Diabetes in her father and sister; Drug abuse in her sister; Hearing loss in her mother; Heart disease in her brother, father, maternal grandfather, maternal grandmother, mother, paternal grandfather, and paternal grandmother; Hyperlipidemia in her father and mother; Hypertension in her father; Other in her father and sister; Sleep apnea in her brother; Thyroid cancer in her mother.    ROS:  Please see the history of present illness.   Otherwise, review of systems are positive for increased stress.   All other systems are reviewed and negative.    PHYSICAL EXAM: VS:  BP 120/80   Pulse 80   Ht 5\' 5"  (1.651 m)   Wt 140 lb 12.8 oz (63.9 kg)   SpO2 99%   BMI 23.43 kg/m  , BMI Body mass index is 23.43 kg/m. GEN: Well nourished, well developed, in no acute distress  HEENT: normal  Neck: no JVD, carotid bruits, or masses Cardiac: RRR; no murmurs, rubs, or gallops,no edema  Respiratory:  clear to auscultation bilaterally, normal work of breathing GI: soft, nontender, nondistended, + BS MS: no deformity or atrophy  Skin: warm and dry, no rash Neuro:  Strength and sensation are intact Psych: euthymic mood,  full affect   EKG:   The ekg ordered today demonstrates NSR, no ST changes   Recent Labs: 08/16/2020: ALT 20; BUN 13; Creat 1.02; Hemoglobin 15.9; Platelets 255; Potassium 4.2; Sodium 140; TSH 2.53   Lipid Panel    Component Value Date/Time   CHOL 252 (H) 08/16/2020 0934   TRIG 324 (H) 08/16/2020 0934   HDL 43 (L) 08/16/2020 0934   CHOLHDL 5.9 (H) 08/16/2020 0934   VLDL 38.0 07/07/2019 1102  [place] 159 (H) 08/16/2020 1700     Other studies Reviewed: Additional studies/ records that were reviewed today with results demonstrating: cardiac CT done with results above.   ASSESSMENT AND PLAN:  1. Family h/o CAD: Mother with AFib- now on Eliquis but she is having trouble with the cost.  Mother had an MI with stent.  Brother died from cardiomyopathy.    2. Palpitations: Known PVCs.  No signs of ischemia on ECG.  Atypical chest pains.  CTA without calcification. Reassured patient that cardiac w/u is negative.  3. DOE: stable.  4. Stress has increased recently due to family issues and pet issues.  She will also see her GI MD about her abdominal issues.    Current medicines are reviewed at length with the patient today.  The patient concerns regarding her medicines were addressed.  The following changes have been made:  No change  Labs/ tests ordered today include:  No orders of the defined types were placed in this encounter.   Recommend 150 minutes/week of aerobic exercise Low fat, low carb, high fiber diet recommended  Disposition:   FU in 1 year   Signed, Larae Grooms, MD  12/04/2020 9:37 AM    [facility] [address], [place], [place]  [address] Phone: [phone]; Fax: [phone]

## 2020-12-03 NOTE — Telephone Encounter (Signed)
Patient was returning phone call. Please call back 

## 2020-12-03 NOTE — Telephone Encounter (Signed)
Attempted to return the patients call regarding her chest pain. No answer, left a message to call the office back.

## 2020-12-03 NOTE — Telephone Encounter (Signed)
Pt c/o of Chest Pain: STAT if CP now or developed within 24 hours  1. Are you having CP right now? No   2. Are you experiencing any other symptoms (ex. SOB, nausea, vomiting, sweating)? Dizziness  3. How long have you been experiencing CP? Since 12/01/20  4. Is your CP continuous or coming and going? Coming and going  5. Have you taken Nitroglycerin? No    STAT if patient feels like he/she is going to faint   1) Are you dizzy now? Yes   2) Do you feel faint or have you passed out? No   3) Do you have any other symptoms? No   4) Have you checked your HR and BP (record if available)?  12/03/20:   HR- 90   No BP reading available   Patient scheduled an appointment for 12/04/20 at 9:00 AM with Dr. Irish Lack.?

## 2020-12-03 NOTE — Telephone Encounter (Signed)
Spoke with the pt and she reports that she has been having sharp shooting pain on the left side of her chest. This started this past Saturday but has been worsening on Sunday 12/02/20... she says it comes and goes and happens at rest and with exertion. She says she has been having some anxiety/ dizziness with it and she has to cool off with a cool rag to help her feel better. She has had some GI upset but she is seeing an MD for it in the near future.   She had a Cardiac CT 05/2020 that did not show CAD.  She has an appt withy Dr. Irish Lack 12/04/20 per the [facility]. I advised her to continue to monitor her symptoms and if she feels they are worsening she should call EMS or have someone take her to the ED and she agreed.

## 2020-12-03 NOTE — Telephone Encounter (Signed)
See previous open encounter re: this pt. Will close this encounter.

## 2020-12-04 ENCOUNTER — Encounter: Payer: Self-pay | Admitting: Interventional Cardiology

## 2020-12-04 ENCOUNTER — Other Ambulatory Visit: Payer: Self-pay

## 2020-12-04 ENCOUNTER — Ambulatory Visit (INDEPENDENT_AMBULATORY_CARE_PROVIDER_SITE_OTHER): Payer: Medicare Other | Admitting: Interventional Cardiology

## 2020-12-04 VITALS — BP 120/80 | HR 80 | Ht 65.0 in | Wt 140.8 lb

## 2020-12-04 DIAGNOSIS — R0602 Shortness of breath: Secondary | ICD-10-CM | POA: Diagnosis not present

## 2020-12-04 DIAGNOSIS — R002 Palpitations: Secondary | ICD-10-CM

## 2020-12-04 DIAGNOSIS — I25118 Atherosclerotic heart disease of native coronary artery with other forms of angina pectoris: Secondary | ICD-10-CM

## 2020-12-04 DIAGNOSIS — Z8249 Family history of ischemic heart disease and other diseases of the circulatory system: Secondary | ICD-10-CM

## 2020-12-04 NOTE — Patient Instructions (Signed)
Medication Instructions:  Your physician recommends that you continue on your current medications as directed. Please refer to the Current Medication list given to you today.  *If you need a refill on your cardiac medications before your next appointment, please call your pharmacy*   Lab Work: NONE If you have labs (blood work) drawn today and your tests are completely normal, you will receive your results only by: MyChart Message (if you have MyChart) OR A paper copy in the mail If you have any lab test that is abnormal or we need to change your treatment, we will call you to review the results.   Testing/Procedures: NONE   Follow-Up: At [facility], you and your health needs are our priority.  As part of our continuing mission to provide you with exceptional heart care, we have created designated Provider Care Teams.  These Care Teams include your primary Cardiologist (physician) and Advanced Practice Providers (APPs -  Physician Assistants and Nurse Practitioners) who all work together to provide you with the care you need, when you need it.  We recommend signing up for the patient portal called "MyChart".  Sign up information is provided on this After Visit Summary.  MyChart is used to connect with patients for Virtual Visits (Telemedicine).  Patients are able to view lab/test results, encounter notes, upcoming appointments, etc.  Non-urgent messages can be sent to your provider as well.   To learn more about what you can do with MyChart, go to [email or web address].    Your next appointment:   1 year(s)  The format for your next appointment:   In Person  Provider:   You may see Jayadeep Varanasi, MD or one of the following Advanced Practice Providers on your designated Care Team:   Dayna Dunn, PA-C Michele Lenze, PA-C    

## 2020-12-05 ENCOUNTER — Telehealth: Payer: Self-pay | Admitting: Gastroenterology

## 2020-12-05 NOTE — Telephone Encounter (Signed)
Okay if 1st okay with RG

## 2020-12-05 NOTE — Telephone Encounter (Signed)
Hi Dr. Lyndel Safe,  This patient would like to switch care from you to Dr. Hilarie Fredrickson. Patient family member sees Dr. Hilarie Fredrickson she would like to see the same provider. Is this switch alright with you?  Thank you

## 2020-12-06 ENCOUNTER — Other Ambulatory Visit: Payer: Self-pay

## 2020-12-06 ENCOUNTER — Telehealth (INDEPENDENT_AMBULATORY_CARE_PROVIDER_SITE_OTHER): Payer: Medicare Other | Admitting: Medical

## 2020-12-06 ENCOUNTER — Telehealth: Payer: Self-pay | Admitting: Medical

## 2020-12-06 VITALS — BP 118/88 | HR 85

## 2020-12-06 DIAGNOSIS — R109 Unspecified abdominal pain: Secondary | ICD-10-CM

## 2020-12-06 DIAGNOSIS — R1012 Left upper quadrant pain: Secondary | ICD-10-CM | POA: Diagnosis not present

## 2020-12-06 DIAGNOSIS — I25118 Atherosclerotic heart disease of native coronary artery with other forms of angina pectoris: Secondary | ICD-10-CM | POA: Diagnosis not present

## 2020-12-06 DIAGNOSIS — R197 Diarrhea, unspecified: Secondary | ICD-10-CM

## 2020-12-06 NOTE — Telephone Encounter (Signed)
Pt needs to have lab done tommorrow stat. She can't come in today. Also ct abd/pelvis can be scheduled tomorrow if prior Josem Kaufmann goes thru.  Please get pt scheduled for labs.

## 2020-12-06 NOTE — Telephone Encounter (Signed)
Absolutely She will be in good hands  RG

## 2020-12-06 NOTE — Patient Instructions (Signed)
History of severe abdomen pain abdomen pain for 2 weeks.  In addition some loose stools reported over the last week.  Pain 7-10 for the past week with episodes of intermittent pain around 10 out of 10 per patient.  Patient notes that she went to MD earlier in the week who thought twisting of intestines possibility or obstruction possible.  Wanted patient to come in today to get stat CBC, CMP and CT abdomen pelvis with contrast.  Patient expressed could not come due to transportation issues.  In light of this did explain the need for signs symptoms worsen or change then be seen in the emergency department.  Patient expressed understanding.  Asking medical assistant to get patient scheduled for stat labs tomorrow morning.  Sent message to referral staff to attempt to get prior authorization for CT abdomen pelvis tomorrow.  During the interim stressed to hydrate well and eat bland foods.  Also explained that tomorrow when she gets labs done she will get stool panel kit.  Advised turning stool panel kit tomorrow.  Might prescribe Cipro and Flagyl as we approached the weekend but turning in stool studies before starting antibiotics would be ideal.  Follow-up date to be determined after lab and imaging review.

## 2020-12-06 NOTE — Progress Notes (Signed)
Subjective:    Patient ID: Diane Gutierrez, female    DOB: July 09, 1966, 55 y.o.   MRN: [id number]  HPI  Virtual Visit via Video Note  I connected with Diane Gutierrez [facility] on 12/06/20 at 10:20 AM EST by a video enabled telemedicine application and verified that I am speaking with the correct person using two identifiers.  Location: Patient: home Provider: office.  participants- pt and myself.   I discussed the limitations of evaluation and management by telemedicine and the availability of in person appointments. The patient expressed understanding and agreed to proceed.  History of Present Illness: Pt states she has been having some abdomen pain for about 2 weeks. She states upper left quadrant pain and some lower left quadrant pain.   Last Ct showed.  IMPRESSION: 1. No acute CT abnormality to provide cause for patient's acute symptoms. Specifically, no evidence of urolithiasis or other acute urinary tract abnormality. 2. Mobile appearance of the cecum, displaced into the left upper quadrant but without evidence of obstruction or volvulus. Normal appendix curls about the displaced cecal tip. 3. Hepatic steatosis. 4. Small hiatal hernia. 5. Aortic Atherosclerosis (ICD10-I70.0).   Pt states last Thursday she started with some loose stools. She states some loose stool for one week.  Pt mentions at times she has pain level up to 10. But her pain is constant 7/10.      Observations/Objective:  General-no acute distress, pleasant, oriented. Lungs- on inspection lungs appear unlabored. Neck- no tracheal deviation or jvd on inspection. Neuro- gross motor function appears intact.   Assessment and Plan: History of severe abdomen pain abdomen pain for 2 weeks.  In addition some loose stools reported over the last week.  Pain 7-10 for the past week with episodes of intermittent pain around 10 out of 10 per patient.  Patient notes that she went to MD earlier in the week  who thought twisting of intestines possibility or obstruction possible.  Wanted patient to come in today to get stat CBC, CMP and CT abdomen pelvis with contrast.  Patient expressed could not come due to transportation issues.  In light of this did explain the need for signs symptoms worsen or change then be seen in the emergency department.  Patient expressed understanding.  Asking medical assistant to get patient scheduled for stat labs tomorrow morning.  Sent message to referral staff to attempt to get prior authorization for CT abdomen pelvis tomorrow.  During the interim stressed to hydrate well and eat bland foods.  Also explained that tomorrow when she gets labs done she will get stool panel kit.  Advised turning stool panel kit tomorrow.  Might prescribe Cipro and Flagyl as we approached the weekend but turning in stool studies before starting antibiotics would be ideal.  Follow-up date to be determined after lab and imaging review.  Follow Up Instructions:    I discussed the assessment and treatment plan with the patient. The patient was provided an opportunity to ask questions and all were answered. The patient agreed with the plan and demonstrated an understanding of the instructions.   The patient was advised to call back or seek an in-person evaluation if the symptoms worsen or if the condition fails to improve as anticipated.  Time spent with patient today was 40  minutes which consisted of chart review, discussing diagnosis, work up, treatment and documentation.  Extra time was needed to explain treatment plan and coordinate labs and CT since she could not come in today.  Mackie Pai, PA-C    Review of Systems  Constitutional: Negative for chills, fatigue and fever.  Respiratory: Negative for cough, chest tightness and shortness of breath.   Cardiovascular: Negative for chest pain and palpitations.  Gastrointestinal: Positive for abdominal pain and diarrhea. Negative  for blood in stool, constipation, nausea and vomiting.  Neurological: Negative for dizziness and headaches.  Hematological: Negative for adenopathy. Does not bruise/bleed easily.  Psychiatric/Behavioral: Negative for behavioral problems and decreased concentration.    Past Medical History:  Diagnosis Date  . Abdominal pain   . Acid reflux   . Anxiety   . Colicky RLQ abdominal pain 07/07/2019  . Fibrocystic breast 07/10/2019  . Hiatal hernia   . Hyperlipidemia   . Leukopenia   . Low back pain   . Lung nodule   . Neck pain   . Palpitations   . Seasonal allergies   . Seizure ([place])    childhood  . Sneezing   . SOB (shortness of breath)   . Tachycardia   . Tachycardia 07/07/2019  . Umbilical hernia   . Weight gain      Social History   Socioeconomic History  . Marital status: Married    Spouse name: Not on file  . Number of children: Not on file  . Years of education: Not on file  . Highest education level: Not on file  Occupational History  . Not on file  Tobacco Use  . Smoking status: Never Smoker  . Smokeless tobacco: Never Used  Vaping Use  . Vaping Use: Never used  Substance and Sexual Activity  . Alcohol use: Not Currently  . Drug use: Never  . Sexual activity: Not on file  Other Topics Concern  . Not on file  Social History Narrative   Lives with husband is on disability, previously worked for [employer], no dietary restrictions.    Social Determinants of Health   Financial Resource Strain: Not on file  Food Insecurity: Not on file  Transportation Needs: Not on file  Physical Activity: Not on file  Stress: Not on file  Social Connections: Not on file  Intimate Partner Violence: Not on file    Past Surgical History:  Procedure Laterality Date  . ABDOMINAL HYSTERECTOMY     precancerous  . BREAST BIOPSY Left   . BREAST SURGERY     lumpectomy, cyst aspirated.on left  . CHOLECYSTECTOMY    . HYSTEROSCOPY DIAGNOSTIC    . JOINT REPLACEMENT Left     knee x 2 with screws in place  . NECK SURGERY     Dr Patrice Paradise    Family History  Problem Relation Age of Onset  . Heart disease Father        s/p CABG  . Diabetes Father   . Hyperlipidemia Father   . Hypertension Father   . Other Father   . Bipolar disorder Father   . Heart disease Brother   . Sleep apnea Brother   . Arthritis Mother   . Hearing loss Mother   . Heart disease Mother        MI  . Hyperlipidemia Mother   . Thyroid cancer Mother   . Drug abuse Sister        crack cocaine  . Other Sister   . Diabetes Sister        smoker  . Arthritis Maternal Grandmother   . Heart disease Maternal Grandmother   . Arthritis Maternal Grandfather   . Heart disease Maternal Grandfather   .  Heart disease Paternal Grandmother   . Cancer Paternal Grandfather   . Heart disease Paternal Grandfather   . Colon cancer Neg Hx   . Esophageal cancer Neg Hx   . Rectal cancer Neg Hx   . Stomach cancer Neg Hx     Allergies  Allergen Reactions  . Prednisone Rash    RASH, severe  . Ibuprofen     GI UPSET    Current Outpatient Medications on File Prior to Visit  Medication Sig Dispense Refill  . acetaminophen (TYLENOL) 500 MG tablet Take 500 mg by mouth every 6 (six) hours as needed.    Marland Kitchen BIOTIN PO Take by mouth.    . Cholecalciferol (VITAMIN D3) 1.25 MG (50000 UT) TABS Take by mouth.    . gabapentin (NEURONTIN) 300 MG capsule Take 1 capsule (300 mg total) by mouth daily. 90 capsule 2  . ipratropium (ATROVENT) 0.06 % nasal spray Place into the nose.    [name] Oil 1000 MG CAPS Take 350 mg by mouth.     . metoprolol succinate (TOPROL-XL) 25 MG 24 hr tablet Take 1 tablet (25 mg total) by mouth daily. Take with or immediately following a meal. 180 tablet 1  . Multiple Vitamin (MULTIVITAMIN) tablet Take 1 tablet by mouth daily.    . Multiple Vitamin (MULTIVITAMIN) tablet Take 1 tablet by mouth daily.    Marland Kitchen omeprazole (PRILOSEC) 20 MG capsule Take 1 capsule (20 mg total) by mouth daily. 90  capsule 1  . promethazine (PHENERGAN) 12.5 MG tablet Take 1 tablet (12.5 mg total) by mouth every 6 (six) hours as needed for nausea or vomiting. 30 tablet 1  . [DISCONTINUED] metoprolol tartrate (LOPRESSOR) 50 MG tablet Take 1 tablet by mouth 2 hours prior to Cardiac CT 1 tablet 0   No current facility-administered medications on file prior to visit.    BP 118/88   Pulse 85       Objective:   Physical Exam        Assessment & Plan:

## 2020-12-06 NOTE — Telephone Encounter (Signed)
Lab appt for 1045

## 2020-12-06 NOTE — Telephone Encounter (Signed)
Will you get ct ab pelvis prior auth. For this pt. Try to get scheduled this afternoon. She is comign for labs. If you would get authoriaztion by early afternoon

## 2020-12-07 ENCOUNTER — Other Ambulatory Visit (INDEPENDENT_AMBULATORY_CARE_PROVIDER_SITE_OTHER): Payer: Medicare Other

## 2020-12-07 ENCOUNTER — Other Ambulatory Visit: Payer: Self-pay

## 2020-12-07 DIAGNOSIS — R109 Unspecified abdominal pain: Secondary | ICD-10-CM

## 2020-12-07 DIAGNOSIS — R197 Diarrhea, unspecified: Secondary | ICD-10-CM | POA: Diagnosis not present

## 2020-12-07 LAB — CBC WITH DIFFERENTIAL/PLATELET
Basophils Absolute: 0 10*3/uL (ref 0.0–0.1)
Basophils Relative: 0.5 % (ref 0.0–3.0)
Eosinophils Absolute: 0.1 10*3/uL (ref 0.0–0.7)
Eosinophils Relative: 1.5 % (ref 0.0–5.0)
HCT: 43 % (ref 36.0–46.0)
Hemoglobin: 14.6 g/dL (ref 12.0–15.0)
Lymphocytes Relative: 33 % (ref 12.0–46.0)
Lymphs Abs: 1.8 10*3/uL (ref 0.7–4.0)
MCHC: 33.9 g/dL (ref 30.0–36.0)
MCV: 90.4 fl (ref 78.0–100.0)
Monocytes Absolute: 0.4 10*3/uL (ref 0.1–1.0)
Monocytes Relative: 7.1 % (ref 3.0–12.0)
Neutro Abs: 3.2 10*3/uL (ref 1.4–7.7)
Neutrophils Relative %: 57.9 % (ref 43.0–77.0)
Platelets: 256 10*3/uL (ref 150.0–400.0)
RBC: 4.76 Mil/uL (ref 3.87–5.11)
RDW: 12.7 % (ref 11.5–15.5)
WBC: 5.6 10*3/uL (ref 4.0–10.5)

## 2020-12-07 LAB — COMPREHENSIVE METABOLIC PANEL
ALT: 17 U/L (ref 0–35)
AST: 20 U/L (ref 0–37)
Albumin: 4.4 g/dL (ref 3.5–5.2)
Alkaline Phosphatase: 78 U/L (ref 39–117)
BUN: 9 mg/dL (ref 6–23)
CO2: 30 mEq/L (ref 19–32)
Calcium: 10 mg/dL (ref 8.4–10.5)
Chloride: 104 mEq/L (ref 96–112)
Creatinine, Ser: 0.83 mg/dL (ref 0.40–1.20)
GFR: 79.76 mL/min (ref >60.00)
Glucose, Bld: 86 mg/dL (ref 70–99)
Potassium: 4.1 mEq/L (ref 3.5–5.1)
Sodium: 141 mEq/L (ref 135–145)
Total Bilirubin: 0.8 mg/dL (ref 0.2–1.2)
Total Protein: 7 g/dL (ref 6.0–8.3)

## 2020-12-07 NOTE — Telephone Encounter (Signed)
Patient called stating CT is not able to perform the test till Monday due to them not having blood work. Patient would like a call back

## 2020-12-07 NOTE — Telephone Encounter (Signed)
Patient did come in for lab appointment today.

## 2020-12-07 NOTE — Telephone Encounter (Signed)
Labs were ordered stat. Can they do labs later today? If not today then tomorrow?  When is she scheduled.

## 2020-12-07 NOTE — Telephone Encounter (Signed)
Spoke with radiology and they stated Monday was scheduled due to no appointments today , called pt and advised her if pain continues Percell Miller stated she can go to the ER

## 2020-12-10 ENCOUNTER — Ambulatory Visit (HOSPITAL_BASED_OUTPATIENT_CLINIC_OR_DEPARTMENT_OTHER)
Admission: RE | Admit: 2020-12-10 | Discharge: 2020-12-10 | Disposition: A | Discharge status: Home / Self Care | Payer: Medicare Other | Source: Ambulatory Visit | Attending: Medical | Admitting: Medical

## 2020-12-10 ENCOUNTER — Other Ambulatory Visit: Payer: Self-pay

## 2020-12-10 ENCOUNTER — Encounter (HOSPITAL_BASED_OUTPATIENT_CLINIC_OR_DEPARTMENT_OTHER): Payer: Self-pay

## 2020-12-10 DIAGNOSIS — R109 Unspecified abdominal pain: Secondary | ICD-10-CM | POA: Diagnosis present

## 2020-12-10 DIAGNOSIS — R1012 Left upper quadrant pain: Secondary | ICD-10-CM

## 2020-12-10 MED ORDER — IOHEXOL 300 MG/ML  SOLN
100.0000 mL | Freq: Once | INTRAMUSCULAR | Status: AC | PRN
  Administered 2020-12-10: 100 mL via INTRAVENOUS

## 2020-12-12 ENCOUNTER — Other Ambulatory Visit: Payer: Self-pay | Admitting: Medical

## 2020-12-13 LAB — CLOSTRIDIUM DIFFICILE BY PCR: Toxigenic C. Difficile by PCR: NEGATIVE

## 2020-12-14 LAB — OVA AND PARASITE EXAMINATION

## 2020-12-17 LAB — STOOL CULTURE: E coli, Shiga toxin Assay: NEGATIVE

## 2021-01-22 ENCOUNTER — Encounter: Payer: Self-pay | Admitting: Family Medicine

## 2021-01-23 ENCOUNTER — Other Ambulatory Visit: Payer: Self-pay | Admitting: Family Medicine

## 2021-01-23 DIAGNOSIS — J302 Other seasonal allergic rhinitis: Secondary | ICD-10-CM

## 2021-01-23 DIAGNOSIS — E785 Hyperlipidemia, unspecified: Secondary | ICD-10-CM

## 2021-01-23 DIAGNOSIS — R000 Tachycardia, unspecified: Secondary | ICD-10-CM

## 2021-01-30 ENCOUNTER — Other Ambulatory Visit: Payer: Medicare Other

## 2021-01-31 ENCOUNTER — Other Ambulatory Visit: Payer: Self-pay

## 2021-01-31 ENCOUNTER — Other Ambulatory Visit (INDEPENDENT_AMBULATORY_CARE_PROVIDER_SITE_OTHER): Payer: Medicare Other

## 2021-01-31 DIAGNOSIS — R000 Tachycardia, unspecified: Secondary | ICD-10-CM

## 2021-01-31 DIAGNOSIS — J302 Other seasonal allergic rhinitis: Secondary | ICD-10-CM | POA: Diagnosis not present

## 2021-01-31 DIAGNOSIS — E785 Hyperlipidemia, unspecified: Secondary | ICD-10-CM | POA: Diagnosis not present

## 2021-01-31 LAB — CBC
HCT: 42.4 % (ref 36.0–46.0)
Hemoglobin: 14.2 g/dL (ref 12.0–15.0)
MCHC: 33.5 g/dL (ref 30.0–36.0)
MCV: 89.8 fl (ref 78.0–100.0)
Platelets: 245 10*3/uL (ref 150.0–400.0)
RBC: 4.73 Mil/uL (ref 3.87–5.11)
RDW: 13.2 % (ref 11.5–15.5)
WBC: 6.2 10*3/uL (ref 4.0–10.5)

## 2021-01-31 LAB — LIPID PANEL
Cholesterol: 246 mg/dL — ABNORMAL HIGH (ref 0–200)
HDL: 39.8 mg/dL (ref >39.00)
LDL Cholesterol: 168 mg/dL — ABNORMAL HIGH (ref 0–99)
NonHDL: 206.48
Total CHOL/HDL Ratio: 6
Triglycerides: 192 mg/dL — ABNORMAL HIGH (ref 0.0–149.0)
VLDL: 38.4 mg/dL (ref 0.0–40.0)

## 2021-01-31 LAB — COMPREHENSIVE METABOLIC PANEL
ALT: 18 U/L (ref 0–35)
AST: 20 U/L (ref 0–37)
Albumin: 4.4 g/dL (ref 3.5–5.2)
Alkaline Phosphatase: 69 U/L (ref 39–117)
BUN: 10 mg/dL (ref 6–23)
CO2: 30 mEq/L (ref 19–32)
Calcium: 9.8 mg/dL (ref 8.4–10.5)
Chloride: 104 mEq/L (ref 96–112)
Creatinine, Ser: 0.83 mg/dL (ref 0.40–1.20)
GFR: 79.67 mL/min (ref >60.00)
Glucose, Bld: 93 mg/dL (ref 70–99)
Potassium: 4.2 mEq/L (ref 3.5–5.1)
Sodium: 142 mEq/L (ref 135–145)
Total Bilirubin: 0.7 mg/dL (ref 0.2–1.2)
Total Protein: 6.9 g/dL (ref 6.0–8.3)

## 2021-01-31 LAB — TSH: TSH: 1.66 u[IU]/mL (ref 0.35–4.50)

## 2021-02-04 ENCOUNTER — Other Ambulatory Visit: Payer: Self-pay

## 2021-02-04 ENCOUNTER — Ambulatory Visit (INDEPENDENT_AMBULATORY_CARE_PROVIDER_SITE_OTHER): Payer: Medicare Other | Admitting: Family Medicine

## 2021-02-04 ENCOUNTER — Encounter: Payer: Self-pay | Admitting: Family Medicine

## 2021-02-04 VITALS — BP 110/66 | HR 70 | Temp 98.3°F | Resp 16 | Wt 142.4 lb

## 2021-02-04 DIAGNOSIS — L989 Disorder of the skin and subcutaneous tissue, unspecified: Secondary | ICD-10-CM

## 2021-02-04 DIAGNOSIS — Z1211 Encounter for screening for malignant neoplasm of colon: Secondary | ICD-10-CM | POA: Insufficient documentation

## 2021-02-04 DIAGNOSIS — L578 Other skin changes due to chronic exposure to nonionizing radiation: Secondary | ICD-10-CM | POA: Diagnosis not present

## 2021-02-04 DIAGNOSIS — Z0000 Encounter for general adult medical examination without abnormal findings: Secondary | ICD-10-CM | POA: Insufficient documentation

## 2021-02-04 DIAGNOSIS — I25118 Atherosclerotic heart disease of native coronary artery with other forms of angina pectoris: Secondary | ICD-10-CM

## 2021-02-04 DIAGNOSIS — K219 Gastro-esophageal reflux disease without esophagitis: Secondary | ICD-10-CM

## 2021-02-04 DIAGNOSIS — E785 Hyperlipidemia, unspecified: Secondary | ICD-10-CM

## 2021-02-04 DIAGNOSIS — R000 Tachycardia, unspecified: Secondary | ICD-10-CM

## 2021-02-04 NOTE — Assessment & Plan Note (Signed)
Avoid offending foods, start probiotics. Do not eat large meals in late evening and consider raising head of bed.  

## 2021-02-04 NOTE — Assessment & Plan Note (Signed)
With lesions on left leg as well. Referred to dermatology for evaluaiton

## 2021-02-04 NOTE — Assessment & Plan Note (Addendum)
Encouraged heart healthy diet, increase exercise, avoid trans fats, consider a krill oil cap daily. She is encouraged to add Atorvastatin or Rosuvastatin but she is hesitant at this time.

## 2021-02-04 NOTE — Assessment & Plan Note (Signed)
She has previously been seen in [facility] and then she transferred to [facility] but now she is trying to transfer to [facility] Dr Philis Kendall to get her next colonoscopy

## 2021-02-04 NOTE — Assessment & Plan Note (Signed)
RRR today 

## 2021-02-04 NOTE — Patient Instructions (Addendum)
Atorvastatin/Lipitor 10 mg Or Rosuvastatin/Crestor 5 mg  CoQ10 enzyme over the counter, NOW company is the company [employer] [place] for [employer]  Shingrix is the new shingles shot, 2 shots over 2-6 months. Check if insurance regarding coverage can get at pharmacy High Cholesterol  High cholesterol is a condition in which the blood has high levels of a white, waxy substance similar to fat (cholesterol). The liver makes all the cholesterol that the body needs. The human body needs small amounts of cholesterol to help build cells. A person gets extra or excess cholesterol from the food that he or she eats. The blood carries cholesterol from the liver to the rest of the body. If you have high cholesterol, deposits (plaques) may build up on the walls of your arteries. Arteries are the blood vessels that carry blood away from your heart. These plaques make the arteries narrow and stiff. Cholesterol plaques increase your risk for heart attack and stroke. Work with your health care provider to keep your cholesterol levels in a healthy range. What increases the risk? The following factors may make you more likely to develop this condition:  Eating foods that are high in animal fat (saturated fat) or cholesterol.  Being overweight.  Not getting enough exercise.  A family history of high cholesterol (familial hypercholesterolemia).  Use of tobacco products.  Having diabetes. What are the signs or symptoms? There are no symptoms of this condition. How is this diagnosed? This condition may be diagnosed based on the results of a blood test.  If you are older than 55 years of age, your health care provider may check your cholesterol levels every 4-6 years.  You may be checked more often if you have high cholesterol or other risk factors for heart disease. The blood test for cholesterol measures:  "Bad" cholesterol, or LDL cholesterol. This is the main type of cholesterol that  causes heart disease. The desired level is less than 100 mg/dL.  "Good" cholesterol, or HDL cholesterol. HDL helps protect against heart disease by cleaning the arteries and carrying the LDL to the liver for processing. The desired level for HDL is 60 mg/dL or higher.  Triglycerides. These are fats that your body can store or burn for energy. The desired level is less than 150 mg/dL.  Total cholesterol. This measures the total amount of cholesterol in your blood and includes LDL, HDL, and triglycerides. The desired level is less than 200 mg/dL. How is this treated? This condition may be treated with:  Diet changes. You may be asked to eat foods that have more fiber and less saturated fats or added sugar.  Lifestyle changes. These may include regular exercise, maintaining a healthy weight, and quitting use of tobacco products.  Medicines. These are given when diet and lifestyle changes have not worked. You may be prescribed a statin medicine to help lower your cholesterol levels. Follow these instructions at home: Eating and drinking  Eat a healthy, balanced diet. This diet includes: ? Daily servings of a variety of fresh, frozen, or canned fruits and vegetables. ? Daily servings of whole grain foods that are rich in fiber. ? Foods that are low in saturated fats and trans fats. These include poultry and fish without skin, lean cuts of meat, and low-fat dairy products. ? A variety of fish, especially oily fish that contain omega-3 fatty acids. Aim to eat fish at least 2 times a week.  Avoid foods and drinks that have added sugar.  Use  healthy cooking methods, such as roasting, grilling, broiling, baking, poaching, steaming, and stir-frying. Do not fry your food except for stir-frying.   Lifestyle  Get regular exercise. Aim to exercise for a total of 150 minutes a week. Increase your activity level by doing activities such as gardening, walking, and taking the stairs.  Do not use any  products that contain nicotine or tobacco, such as cigarettes, e-cigarettes, and chewing tobacco. If you need help quitting, ask your health care provider.   General instructions  Take over-the-counter and prescription medicines only as told by your health care provider.  Keep all follow-up visits as told by your health care provider. This is important. Where to find more information  American Heart Association: www.heart.org  National Heart, Lung, and Blood Institute: https://wilson-eaton.com/ Contact a health care provider if:  You have trouble achieving or maintaining a healthy diet or weight.  You are starting an exercise program.  You are unable to stop smoking. Get help right away if:  You have chest pain.  You have trouble breathing.  You have any symptoms of a stroke. "BE FAST" is an easy way to remember the main warning signs of a stroke: ? B - Balance. Signs are dizziness, sudden trouble walking, or loss of balance. ? E - Eyes. Signs are trouble seeing or a sudden change in vision. ? F - Face. Signs are sudden weakness or numbness of the face, or the face or eyelid drooping on one side. ? A - Arms. Signs are weakness or numbness in an arm. This happens suddenly and usually on one side of the body. ? S - Speech. Signs are sudden trouble speaking, slurred speech, or trouble understanding what people say. ? T - Time. Time to call emergency services. Write down what time symptoms started.  You have other signs of a stroke, such as: ? A sudden, severe headache with no known cause. ? Nausea or vomiting. ? Seizure. These symptoms may represent a serious problem that is an emergency. Do not wait to see if the symptoms will go away. Get medical help right away. Call your local emergency services (911 in the [place] Do not drive yourself to the hospital. Summary  Cholesterol plaques increase your risk for heart attack and stroke. Work with your health care provider to keep your  cholesterol levels in a healthy range.  Eat a healthy, balanced diet, get regular exercise, and maintain a healthy weight.  Do not use any products that contain nicotine or tobacco, such as cigarettes, e-cigarettes, and chewing tobacco.  Get help right away if you have any symptoms of a stroke. This information is not intended to replace advice given to you by your health care provider. Make sure you discuss any questions you have with your health care provider. Document Revised: 09/26/2019 Document Reviewed: 09/26/2019 Elsevier Patient Education  2021 [employer].

## 2021-02-04 NOTE — Progress Notes (Signed)
Subjective:    Patient ID: Diane Gutierrez, female    DOB: 04-May-1966, 55 y.o.   MRN: [id number]  Chief Complaint  Patient presents with  . Follow-up  . GI Problem    HPI Patient is in today for follow up on chronic medical concerns. No recent febrile illness or hospitalizations. She is struggling with stress with numerous sick family members   She has been trying to maintain a heart healthy diet and stay active. Denies CP/palp/SOB/HA/congestion/fevers/GI or GU c/o. Taking meds as prescribed  Past Medical History:  Diagnosis Date  . Abdominal pain   . Acid reflux   . Anxiety   . Colicky RLQ abdominal pain 07/07/2019  . Fibrocystic breast 07/10/2019  . Hiatal hernia   . Hyperlipidemia   . Leukopenia   . Low back pain   . Lung nodule   . Neck pain   . Palpitations   . Seasonal allergies   . Seizure ([place])    childhood  . Sneezing   . SOB (shortness of breath)   . Tachycardia   . Tachycardia 07/07/2019  . Umbilical hernia   . Weight gain     Past Surgical History:  Procedure Laterality Date  . ABDOMINAL HYSTERECTOMY     precancerous  . BREAST BIOPSY Left   . BREAST SURGERY     lumpectomy, cyst aspirated.on left  . CHOLECYSTECTOMY    . HYSTEROSCOPY DIAGNOSTIC    . JOINT REPLACEMENT Left    knee x 2 with screws in place  . NECK SURGERY     Dr Patrice Paradise    Family History  Problem Relation Age of Onset  . Heart disease Father        s/p CABG  . Diabetes Father   . Hyperlipidemia Father   . Hypertension Father   . Other Father   . Heart disease Brother   . Sleep apnea Brother   . Arthritis Mother   . Hearing loss Mother   . Heart disease Mother        MI  . Hyperlipidemia Mother   . Thyroid cancer Mother   . Drug abuse Sister        crack cocaine  . Other Sister   . Diabetes Sister        smoker  . Bipolar disorder Sister   . Arthritis Maternal Grandmother   . Heart disease Maternal Grandmother   . Arthritis Maternal Grandfather   . Heart disease  Maternal Grandfather   . Heart disease Paternal Grandmother   . Cancer Paternal Grandfather   . Heart disease Paternal Grandfather   . Colon cancer Neg Hx   . Esophageal cancer Neg Hx   . Rectal cancer Neg Hx   . Stomach cancer Neg Hx     Social History   Socioeconomic History  . Marital status: Married    Spouse name: Not on file  . Number of children: Not on file  . Years of education: Not on file  . Highest education level: Not on file  Occupational History  . Not on file  Tobacco Use  . Smoking status: Never Smoker  . Smokeless tobacco: Never Used  Vaping Use  . Vaping Use: Never used  Substance and Sexual Activity  . Alcohol use: Not Currently  . Drug use: Never  . Sexual activity: Not on file  Other Topics Concern  . Not on file  Social History Narrative   Lives with husband is on disability, previously worked  for [employer], no dietary restrictions.    Social Determinants of Health   Financial Resource Strain: Not on file  Food Insecurity: Not on file  Transportation Needs: Not on file  Physical Activity: Not on file  Stress: Not on file  Social Connections: Not on file  Intimate Partner Violence: Not on file    Outpatient Medications Prior to Visit  Medication Sig Dispense Refill  . acetaminophen (TYLENOL) 500 MG tablet Take 500 mg by mouth every 6 (six) hours as needed.    . Ascorbic Acid (VITAMIN C) 100 MG tablet Take 100 mg by mouth daily.    Marland Kitchen BIOTIN PO Take by mouth.    . Cholecalciferol (VITAMIN D3) 1.25 MG (50000 UT) TABS Take by mouth.    . ELDERBERRY PO Take by mouth.    . gabapentin (NEURONTIN) 300 MG capsule Take 1 capsule (300 mg total) by mouth daily. 90 capsule 2  . Krill Oil 1000 MG CAPS Take 350 mg by mouth.     . metoprolol succinate (TOPROL-XL) 25 MG 24 hr tablet Take 1 tablet (25 mg total) by mouth daily. Take with or immediately following a meal. 180 tablet 1  . Multiple Vitamin (MULTIVITAMIN) tablet Take 1 tablet by mouth daily.     Marland Kitchen omeprazole (PRILOSEC) 20 MG capsule Take 1 capsule (20 mg total) by mouth daily. 90 capsule 1  . promethazine (PHENERGAN) 12.5 MG tablet Take 1 tablet (12.5 mg total) by mouth every 6 (six) hours as needed for nausea or vomiting. 30 tablet 1  . Multiple Vitamin (MULTIVITAMIN) tablet Take 1 tablet by mouth daily.    Marland Kitchen ipratropium (ATROVENT) 0.06 % nasal spray Place into the nose.     No facility-administered medications prior to visit.    Allergies  Allergen Reactions  . Prednisone Rash    RASH, severe  . Ibuprofen     GI UPSET    Review of Systems  Constitutional: Negative for fever and malaise/fatigue.  HENT: Negative for congestion.   Eyes: Negative for blurred vision.  Respiratory: Negative for shortness of breath.   Cardiovascular: Negative for chest pain, palpitations and leg swelling.  Gastrointestinal: Negative for abdominal pain, blood in stool and nausea.  Genitourinary: Negative for dysuria and frequency.  Musculoskeletal: Negative for falls.  Skin: Negative for rash.  Neurological: Negative for dizziness, loss of consciousness and headaches.  Endo/Heme/Allergies: Negative for environmental allergies.  Psychiatric/Behavioral: Negative for depression. The patient is not nervous/anxious.        Objective:    Physical Exam Vitals and nursing note reviewed.  Constitutional:      General: She is not in acute distress.    Appearance: She is well-developed.  HENT:     Head: Normocephalic and atraumatic.     Nose: Nose normal.  Eyes:     General:        Right eye: No discharge.        Left eye: No discharge.  Cardiovascular:     Rate and Rhythm: Normal rate and regular rhythm.     Heart sounds: No murmur heard.   Pulmonary:     Effort: Pulmonary effort is normal.     Breath sounds: Normal breath sounds.  Abdominal:     General: Bowel sounds are normal.     Palpations: Abdomen is soft.     Tenderness: There is no abdominal tenderness.  Musculoskeletal:      Cervical back: Normal range of motion and neck supple.  Skin:  General: Skin is warm and dry.  Neurological:     Mental Status: She is alert and oriented to person, place, and time.     BP 110/66   Pulse 70   Temp 98.3 F (36.8 C)   Resp 16   Wt 142 lb 6.4 oz (64.6 kg)   SpO2 99%   BMI 23.70 kg/m  Wt Readings from Last 3 Encounters:  02/04/21 142 lb 6.4 oz (64.6 kg)  12/04/20 140 lb 12.8 oz (63.9 kg)  08/07/20 145 lb (65.8 kg)    Diabetic Foot Exam - Simple   No data filed    Lab Results  Component Value Date   WBC 6.2 01/31/2021   HGB 14.2 01/31/2021   HCT 42.4 01/31/2021   PLT 245.0 01/31/2021   GLUCOSE 93 01/31/2021   CHOL 246 (H) 01/31/2021   TRIG 192.0 (H) 01/31/2021   HDL 39.80 01/31/2021   LDLCALC 168 (H) 01/31/2021   ALT 18 01/31/2021   AST 20 01/31/2021   NA 142 01/31/2021   K 4.2 01/31/2021   CL 104 01/31/2021   CREATININE 0.83 01/31/2021   BUN 10 01/31/2021   CO2 30 01/31/2021   TSH 1.66 01/31/2021    Lab Results  Component Value Date   TSH 1.66 01/31/2021   Lab Results  Component Value Date   WBC 6.2 01/31/2021   HGB 14.2 01/31/2021   HCT 42.4 01/31/2021   MCV 89.8 01/31/2021   PLT 245.0 01/31/2021   Lab Results  Component Value Date   NA 142 01/31/2021   K 4.2 01/31/2021   CO2 30 01/31/2021   GLUCOSE 93 01/31/2021   BUN 10 01/31/2021   CREATININE 0.83 01/31/2021   BILITOT 0.7 01/31/2021   ALKPHOS 69 01/31/2021   AST 20 01/31/2021   ALT 18 01/31/2021   PROT 6.9 01/31/2021   ALBUMIN 4.4 01/31/2021   CALCIUM 9.8 01/31/2021   ANIONGAP 10 04/30/2020   GFR 79.67 01/31/2021   Lab Results  Component Value Date   CHOL 246 (H) 01/31/2021   Lab Results  Component Value Date   HDL 39.80 01/31/2021   Lab Results  Component Value Date   LDLCALC 168 (H) 01/31/2021   Lab Results  Component Value Date   TRIG 192.0 (H) 01/31/2021   Lab Results  Component Value Date   CHOLHDL 6 01/31/2021   No results found for:  HGBA1C     Assessment & Plan:   Problem List Items Addressed This Visit    Tachycardia    RRR today      Hyperlipidemia    Encouraged heart healthy diet, increase exercise, avoid trans fats, consider a krill oil cap daily. She is encouraged to add Atorvastatin or Rosuvastatin but she is hesitant at this time.       Acid reflux    Avoid offending foods, start probiotics. Do not eat large meals in late evening and consider raising head of bed.       Colon cancer screening    She has previously been seen in [facility] and then she transferred to [facility] but now she is trying to transfer to [facility] Dr Philis Kendall to get her next colonoscopy      Sun-damaged skin - Primary    With lesions on left leg as well. Referred to dermatology for evaluaiton      Relevant Orders   Ambulatory referral to Dermatology    Other Visit Diagnoses    Skin lesion of left  lower extremity       Relevant Orders   Ambulatory referral to Dermatology      I am having Diane L. Harbin "Kim" maintain her BIOTIN PO, multivitamin, Krill Oil, promethazine, gabapentin, omeprazole, metoprolol succinate, ipratropium, Vitamin D3, acetaminophen, vitamin C, and ELDERBERRY PO.  No orders of the defined types were placed in this encounter.    Penni Homans, MD

## 2021-02-06 ENCOUNTER — Encounter: Payer: Self-pay | Admitting: Family Medicine

## 2021-02-06 ENCOUNTER — Other Ambulatory Visit: Payer: Self-pay | Admitting: Family Medicine

## 2021-02-06 DIAGNOSIS — Z1211 Encounter for screening for malignant neoplasm of colon: Secondary | ICD-10-CM

## 2021-02-06 DIAGNOSIS — K581 Irritable bowel syndrome with constipation: Secondary | ICD-10-CM

## 2021-02-06 DIAGNOSIS — K219 Gastro-esophageal reflux disease without esophagitis: Secondary | ICD-10-CM

## 2021-02-08 ENCOUNTER — Telehealth: Payer: Self-pay | Admitting: Interventional Cardiology

## 2021-02-08 NOTE — Telephone Encounter (Signed)
Since calcium score was 0 in 2021, I doubt her pains are cardiac.  Continue to monitor HR and BP.   JV

## 2021-02-08 NOTE — Telephone Encounter (Signed)
Returned call to Pt.  Advised per Dr. Irish Lack pain is not cardiac.  Pt reassured.

## 2021-02-08 NOTE — Telephone Encounter (Signed)
    Pt c/o of Chest Pain: STAT if CP now or developed within 24 hours  1. Are you having CP right now? Yes,   2. Are you experiencing any other symptoms (ex. SOB, nausea, vomiting, sweating)? Fatigue,   3. How long have you been experiencing CP? Last night  4. Is your CP continuous or coming and going? Coming and going   5. Have you taken Nitroglycerin? No ?  Pt felt chest tightness last night, she said it was pretty bad, she also said that the pain ease off but she can still feel it is lingering right now. She said she has not taken nitroglycerin, she feels weak and feel her heart beat is beating fast

## 2021-02-08 NOTE — Telephone Encounter (Signed)
Spoke with the patient who states that yesterday she has an episode of chest pain. She said that her chest felt tight and it was sharp at times. She states that it was similar to episodes she has had in the past. She also could feel her heart fluttering. She also reports some back pain. She denies any dizziness or lightheadedness. She states that she does get some shortness of breath with exertion but didn't experience any SOB with chest pain. She states that today she is feeling better but still having some chest discomfort. No palpitations today.  Blood pressure is 118/85 and heart rate 89.  She also would like for Dr. Irish Lack to look at her lab work that she had done recently. She knows her cholesterol is elevated but is hesitant to start on any medications.  Advised patient on ER precautions for chest pain and worsening symptoms. Patient verbalized understanding. Will send to Dr. Irish Lack for any further recommendations.

## 2021-02-14 DIAGNOSIS — R002 Palpitations: Secondary | ICD-10-CM

## 2021-03-04 ENCOUNTER — Telehealth: Payer: Self-pay | Admitting: Family Medicine

## 2021-03-04 NOTE — Telephone Encounter (Signed)
Patient states she is requesting for a referral to an orthopedic for her shoulder pain. She states she would like to be referred to  [facility] Dr.  Onnie Graham . Her appt is already scheduled for May 20th. Ortho just wanted a referral. Please inform patient once referral is placed.

## 2021-03-05 ENCOUNTER — Other Ambulatory Visit: Payer: Self-pay | Admitting: Family Medicine

## 2021-03-05 ENCOUNTER — Telehealth: Payer: Self-pay | Admitting: Interventional Cardiology

## 2021-03-05 DIAGNOSIS — M25519 Pain in unspecified shoulder: Secondary | ICD-10-CM

## 2021-03-05 NOTE — Telephone Encounter (Signed)
Is this ok to place referral 

## 2021-03-05 NOTE — Telephone Encounter (Signed)
done

## 2021-03-05 NOTE — Telephone Encounter (Signed)
Thanks Shamea.  I think this is the bvest way to hanfdle this.  I would recommend Valium 5 mg PO x 1 before procedure for the dentist to prescribe.  JV

## 2021-03-05 NOTE — Telephone Encounter (Signed)
Patient called to say she is having a dentist procedure done and the Dr office wanted Dr Irish Lack to prescribe her something to get through the procedure. They didn't want to do it and it not be right. The first procedure is May 4th. Please advise

## 2021-03-05 NOTE — Telephone Encounter (Signed)
Called patient back in regards to dental procedure. Patient is requesting medication to calm her for the procedure.  I notified her that we don't typically order calming meds for dental procedures.  She expressed that dentist office was nervous to prescribe medication d/t her dx of tachycardia.  I first told her that I would see if Dr. Irish Lack has any suggestions for calming meds for dentist to prescribe.  I then told her to have dentist office to reach out to our office; if they need assistance with determining which medication would be appropriate.  She expressed that she would call her dentist office and have them contact our office.

## 2021-03-14 ENCOUNTER — Encounter: Payer: Self-pay | Admitting: Family Medicine

## 2021-03-14 NOTE — Telephone Encounter (Signed)
Do you need a new referral to [facility]?

## 2021-03-14 NOTE — Telephone Encounter (Signed)
I spoke with patient.  She is not having increase in symptoms at the moment.  I told patient we recommended patient's go to [facility] if needed or to closest ED if acute issues. She does not feel she needs to go to ED at current time.  Patient reports she is having root canal on 5/9.  Dentist has prescribed Valium and I told her this would be OK to take prior to appointment. Patient with history of neck issues.  Saw orthopedic provider recently and neck exam was OK.  Is seeing ortho soon for shoulder issues. I told patient I would forward her message to Dr Irish Lack.  She would like to be called with his recommendations.

## 2021-03-15 ENCOUNTER — Ambulatory Visit (INDEPENDENT_AMBULATORY_CARE_PROVIDER_SITE_OTHER): Payer: Medicare Other

## 2021-03-15 DIAGNOSIS — R002 Palpitations: Secondary | ICD-10-CM

## 2021-03-15 NOTE — Telephone Encounter (Signed)
I spoke with her and gave her information about monitor

## 2021-03-21 ENCOUNTER — Telehealth: Payer: Self-pay | Admitting: Interventional Cardiology

## 2021-03-21 NOTE — Telephone Encounter (Signed)
New Message:      Pt wants to  Know if f she can come in to the office and get somebody to help her put her Monitor on. She said she can not get it on,

## 2021-03-21 NOTE — Telephone Encounter (Signed)
Offered patient a 4:30 appt today or an appointment Monday,03/25/21, to come to [facility] office to have monitor applied. Explained directions to apply ZIO XT Patch monitor.  Patient has also watched the tutorial video.  She will see if her husband can help her apply today when he gets home from work.  Otherwise , she will call back to set appointment.  She will be in [facility] for a dentist appointment 03/25/21 at 8:00.

## 2021-03-22 NOTE — Telephone Encounter (Signed)
[name] is calling requesting to speak with Diane Gutierrez to schedule her Zio monitor being placed. She states she will most likely be at her dentist appointment on Monday until 10:00 Am. Please advise.

## 2021-03-25 DIAGNOSIS — R002 Palpitations: Secondary | ICD-10-CM

## 2021-03-25 NOTE — Telephone Encounter (Signed)
Patient states her husband had a heart attack Saturday.  He was discharged from [facility] today, and a nurse at home assisted her with applying her ZIO XT monitor.

## 2021-04-19 ENCOUNTER — Telehealth: Payer: Self-pay | Admitting: *Deleted

## 2021-04-19 DIAGNOSIS — M79661 Pain in right lower leg: Secondary | ICD-10-CM

## 2021-04-19 NOTE — Telephone Encounter (Signed)
Diane Booze, MD  You 13 minutes ago (5:23 PM)     OK to schedule Le venous doppler to r/o DVT next week some time   Patient notified.  She is aware this is at the [facility] office

## 2021-04-19 NOTE — Telephone Encounter (Signed)
I spoke with patient and reviewed monitor results with her. She reports several knots on her lower legs.  One is on the front of her right leg near her shin. She saw dermatologist who felt it was in a vein and recommended she have an ultrasound to make sure there was no restriction of blood flow.  Patient reports pain in her right leg behind her knee cap. She sometimes has pain when walking but other times the pain will happen at random times such as at night or when she is resting. Feet are normal in color.  Does have numbness and feeling like pins and needles in her feet at times. She feels right leg may be a little bigger on the backside but also reports she has lost weight recently.  I told her I would check with Dr Irish Lack about need for possible testing

## 2021-04-25 ENCOUNTER — Telehealth: Payer: Self-pay | Admitting: Interventional Cardiology

## 2021-04-25 ENCOUNTER — Other Ambulatory Visit: Payer: Self-pay

## 2021-04-25 ENCOUNTER — Ambulatory Visit (HOSPITAL_COMMUNITY)
Admission: RE | Admit: 2021-04-25 | Discharge: 2021-04-25 | Disposition: A | Discharge status: Home / Self Care | Payer: Medicare Other | Source: Ambulatory Visit | Attending: Interventional Cardiology | Admitting: Interventional Cardiology

## 2021-04-25 DIAGNOSIS — M79661 Pain in right lower leg: Secondary | ICD-10-CM | POA: Diagnosis present

## 2021-04-25 DIAGNOSIS — M7989 Other specified soft tissue disorders: Secondary | ICD-10-CM | POA: Diagnosis present

## 2021-04-25 NOTE — Telephone Encounter (Signed)
Negative for DVT or any superficial thrombus.  Tender area she pointed out is fine as far as she can see.

## 2021-04-25 NOTE — Telephone Encounter (Signed)
[facility] calling w/ puliminary results

## 2021-05-07 ENCOUNTER — Telehealth: Payer: Self-pay | Admitting: Family Medicine

## 2021-06-07 ENCOUNTER — Emergency Department (HOSPITAL_BASED_OUTPATIENT_CLINIC_OR_DEPARTMENT_OTHER)
Admission: EM | Admit: 2021-06-07 | Discharge: 2021-06-07 | Disposition: A | Discharge status: Home / Self Care | Payer: Medicare Other | Attending: Emergency Medicine | Admitting: Emergency Medicine

## 2021-06-07 ENCOUNTER — Encounter (HOSPITAL_BASED_OUTPATIENT_CLINIC_OR_DEPARTMENT_OTHER): Payer: Self-pay | Admitting: Emergency Medicine

## 2021-06-07 ENCOUNTER — Other Ambulatory Visit: Payer: Self-pay

## 2021-06-07 ENCOUNTER — Emergency Department (HOSPITAL_BASED_OUTPATIENT_CLINIC_OR_DEPARTMENT_OTHER): Payer: Medicare Other | Admitting: Radiology

## 2021-06-07 ENCOUNTER — Emergency Department (HOSPITAL_BASED_OUTPATIENT_CLINIC_OR_DEPARTMENT_OTHER): Payer: Medicare Other

## 2021-06-07 DIAGNOSIS — Z96652 Presence of left artificial knee joint: Secondary | ICD-10-CM | POA: Diagnosis not present

## 2021-06-07 DIAGNOSIS — R519 Headache, unspecified: Secondary | ICD-10-CM | POA: Insufficient documentation

## 2021-06-07 DIAGNOSIS — K0889 Other specified disorders of teeth and supporting structures: Secondary | ICD-10-CM | POA: Insufficient documentation

## 2021-06-07 DIAGNOSIS — R110 Nausea: Secondary | ICD-10-CM | POA: Insufficient documentation

## 2021-06-07 DIAGNOSIS — R0789 Other chest pain: Secondary | ICD-10-CM | POA: Diagnosis not present

## 2021-06-07 LAB — CBC
HCT: 40.4 % (ref 36.0–46.0)
Hemoglobin: 13.5 g/dL (ref 12.0–15.0)
MCH: 30.4 pg (ref 26.0–34.0)
MCHC: 33.4 g/dL (ref 30.0–36.0)
MCV: 91 fL (ref 80.0–100.0)
Platelets: 242 10*3/uL (ref 150–400)
RBC: 4.44 MIL/uL (ref 3.87–5.11)
RDW: 12.6 % (ref 11.5–15.5)
WBC: 6.2 10*3/uL (ref 4.0–10.5)
nRBC: 0 % (ref 0.0–0.2)

## 2021-06-07 LAB — BASIC METABOLIC PANEL
Anion gap: 9 (ref 5–15)
BUN: 8 mg/dL (ref 6–20)
CO2: 28 mmol/L (ref 22–32)
Calcium: 9.1 mg/dL (ref 8.9–10.3)
Chloride: 104 mmol/L (ref 98–111)
Creatinine, Ser: 0.7 mg/dL (ref 0.44–1.00)
GFR, Estimated: >60 mL/min (ref >60)
Glucose, Bld: 98 mg/dL (ref 70–99)
Potassium: 3.5 mmol/L (ref 3.5–5.1)
Sodium: 141 mmol/L (ref 135–145)

## 2021-06-07 LAB — TROPONIN I (HIGH SENSITIVITY)
Troponin I (High Sensitivity): 4 ng/L (ref <18)
Troponin I (High Sensitivity): 4 ng/L (ref <18)

## 2021-06-07 LAB — PREGNANCY, URINE: Preg Test, Ur: NEGATIVE

## 2021-06-07 MED ORDER — SODIUM CHLORIDE 0.9 % IV BOLUS
1000.0000 mL | Freq: Once | INTRAVENOUS | Status: AC
  Administered 2021-06-07: 1000 mL via INTRAVENOUS

## 2021-06-07 MED ORDER — ONDANSETRON 4 MG PO TBDP: 4.0000 mg | ORAL_TABLET | Freq: Once | ORAL | Status: AC

## 2021-06-07 MED ORDER — KETOROLAC TROMETHAMINE 30 MG/ML IJ SOLN
30.0000 mg | Freq: Once | INTRAMUSCULAR | Status: AC
  Administered 2021-06-07: 30 mg via INTRAVENOUS
  Filled 2021-06-07: qty 1

## 2021-06-07 MED ORDER — METOCLOPRAMIDE HCL 5 MG/ML IJ SOLN
5.0000 mg | Freq: Once | INTRAMUSCULAR | Status: AC
  Administered 2021-06-07: 5 mg via INTRAVENOUS
  Filled 2021-06-07: qty 2

## 2021-06-07 MED ORDER — ONDANSETRON HCL 4 MG/2ML IJ SOLN
4.0000 mg | Freq: Once | INTRAMUSCULAR | Status: DC
  Filled 2021-06-07: qty 2

## 2021-06-07 MED ORDER — ONDANSETRON 4 MG PO TBDP
ORAL_TABLET | ORAL | Status: AC
  Filled 2021-06-07: qty 1

## 2021-06-07 MED ORDER — DIPHENHYDRAMINE HCL 50 MG/ML IJ SOLN
12.5000 mg | Freq: Once | INTRAMUSCULAR | Status: AC
  Administered 2021-06-07: 12.5 mg via INTRAVENOUS
  Filled 2021-06-07: qty 1

## 2021-06-07 NOTE — ED Triage Notes (Signed)
Pt presents to ED Pov. Pt c/o dizziness, intermittent CP, intermittent L jaw numbness/tingling, and nausea. Pt reports all began this morning.

## 2021-06-07 NOTE — Discharge Instructions (Addendum)
You have been seen and discharged from the emergency department.  Follow-up with your dentist for reevaluation and further care. Take antibiotic for infected tooth as prescribed. If you have any worsening symptoms or further concerns for your health please return to an emergency department for further evaluation.

## 2021-06-07 NOTE — ED Provider Notes (Signed)
[facility] EMERGENCY DEPT Provider Note   CSN: [id number] Arrival date & time: 06/07/21  1522     History Chief Complaint  Patient presents with   Dizziness    Diane Gutierrez is a 55 y.o. female.  HPI  55 year old female with past medical history of HTN, HLD, hiatal hernia presents the emergency department with multiple complaints.  Patient reportedly has a left upper tooth that is impacted into her sinus.  They believe that there is an infection and she has been prescribed antibiotics that she was post to start today.  Patient has not started the antibiotics and presents complaining of left upper tooth pain, headache, and episode of chest pressure and nausea.  Denies any fever or chills.  No shortness of breath/cough.  No vomiting or diarrhea, no swelling of her lower extremities.  She already has outpatient follow-up with dental for tooth extraction. She has had nothing to eat or drink today and feels lightheaded.  Past Medical History:  Diagnosis Date   Abdominal pain    Acid reflux    Anxiety    Colicky RLQ abdominal pain 07/07/2019   Fibrocystic breast 07/10/2019   Hiatal hernia    Hyperlipidemia    Leukopenia    Low back pain    Lung nodule    Neck pain    Palpitations    Seasonal allergies    Seizure (HCC)    childhood   Sneezing    SOB (shortness of breath)    Tachycardia    Tachycardia [id number]   Umbilical hernia    Weight gain     Patient Active Problem List   Diagnosis Date Noted   Colon cancer screening 02/04/2021   Sun-damaged skin 02/04/2021   Urinary frequency 08/08/2020   Dysphagia 10/09/2019   Fibrocystic breast 07/10/2019   Tachycardia 07/07/2019   IBS (irritable bowel syndrome) 07/07/2019   Anxiety    Hyperlipidemia    Seasonal allergies    Neck pain    Low back pain    Acid reflux    PVC (premature ventricular contraction) 06/22/2019    Past Surgical History:  Procedure Laterality Date   ABDOMINAL HYSTERECTOMY      precancerous   BREAST BIOPSY Left    BREAST SURGERY     lumpectomy, cyst aspirated.on left   CHOLECYSTECTOMY     HYSTEROSCOPY DIAGNOSTIC     JOINT REPLACEMENT Left    knee x 2 with screws in place   NECK SURGERY     Dr Patrice Paradise     OB History   No obstetric history on file.     Family History  Problem Relation Age of Onset   Heart disease Father        s/p CABG   Diabetes Father    Hyperlipidemia Father    Hypertension Father    Other Father    Heart disease Brother    Sleep apnea Brother    Arthritis Mother    Hearing loss Mother    Heart disease Mother        MI   Hyperlipidemia Mother    Thyroid cancer Mother    Drug abuse Sister        crack cocaine   Other Sister    Diabetes Sister        smoker   Bipolar disorder Sister    Arthritis Maternal Grandmother    Heart disease Maternal Grandmother    Arthritis Maternal Grandfather    Heart disease Maternal Grandfather  Heart disease Paternal Grandmother    Cancer Paternal Grandfather    Heart disease Paternal Grandfather    Colon cancer Neg Hx    Esophageal cancer Neg Hx    Rectal cancer Neg Hx    Stomach cancer Neg Hx     Social History   Tobacco Use   Smoking status: Never   Smokeless tobacco: Never  Vaping Use   Vaping Use: Never used  Substance Use Topics   Alcohol use: Not Currently   Drug use: Never    Home Medications Prior to Admission medications   Medication Sig Start Date End Date Taking? Authorizing Provider  acetaminophen (TYLENOL) 500 MG tablet Take 500 mg by mouth every 6 (six) hours as needed.    [provider]  Ascorbic Acid (VITAMIN C) 100 MG tablet Take 100 mg by mouth daily.    [provider]  BIOTIN PO Take by mouth.    [provider]  Cholecalciferol (VITAMIN D3) 1.25 MG (50000 UT) TABS Take by mouth.    [provider]  ELDERBERRY PO Take by mouth.    [provider]  gabapentin (NEURONTIN) 300 MG capsule Take 1 capsule (300  mg total) by mouth daily. 08/07/20   Mosie Lukes, MD  ipratropium (ATROVENT) 0.06 % nasal spray Place into the nose. 11/29/20 12/29/20  [provider]  [name] Oil 1000 MG CAPS Take 350 mg by mouth.     [provider]  metoprolol succinate (TOPROL-XL) 25 MG 24 hr tablet Take 1 tablet (25 mg total) by mouth daily. Take with or immediately following a meal. 10/24/20   Jettie Booze, MD  Multiple Vitamin (MULTIVITAMIN) tablet Take 1 tablet by mouth daily.    [provider]  omeprazole (PRILOSEC) 20 MG capsule Take 1 capsule (20 mg total) by mouth daily. 08/20/20   Mosie Lukes, MD  promethazine (PHENERGAN) 12.5 MG tablet Take 1 tablet (12.5 mg total) by mouth every 6 (six) hours as needed for nausea or vomiting. 04/12/20   Mosie Lukes, MD  metoprolol tartrate (LOPRESSOR) 50 MG tablet Take 1 tablet by mouth 2 hours prior to Cardiac CT 04/13/20 05/01/20  Jettie Booze, MD    Allergies    Prednisone and Ibuprofen  Review of Systems   Review of Systems  Constitutional:  Positive for fatigue. Negative for chills and fever.  HENT:  Positive for dental problem. Negative for congestion.   Eyes:  Negative for visual disturbance.  Respiratory:  Negative for shortness of breath.   Cardiovascular:  Negative for chest pain.  Gastrointestinal:  Positive for nausea. Negative for abdominal pain, diarrhea and vomiting.  Genitourinary:  Negative for dysuria.  Musculoskeletal:  Negative for neck pain and neck stiffness.  Skin:  Negative for rash.  Neurological:  Positive for headaches.   Physical Exam Updated Vital Signs BP 136/78   Pulse 73   Temp 98.8 F (37.1 C)   Resp 17   Ht '5\' 5"'$  (1.651 m)   Wt 58.1 kg   SpO2 100%   BMI 21.30 kg/m   Physical Exam Vitals and nursing note reviewed.  Constitutional:      General: She is not in acute distress.    Appearance: Normal appearance.  HENT:     Head: Normocephalic.     Mouth/Throat:     Mouth: Mucous  membranes are moist.  Eyes:     Extraocular Movements: Extraocular movements intact.     Pupils: Pupils are equal,  round, and reactive to light.  Cardiovascular:     Rate and Rhythm: Normal rate.  Pulmonary:     Effort: Pulmonary effort is normal. No respiratory distress.  Abdominal:     Palpations: Abdomen is soft.     Tenderness: There is no abdominal tenderness.  Musculoskeletal:        General: No deformity.     Cervical back: No rigidity or tenderness.  Skin:    General: Skin is warm.  Neurological:     Mental Status: She is alert and oriented to person, place, and time. Mental status is at baseline.     Cranial Nerves: No cranial nerve deficit.     Motor: No weakness.  Psychiatric:        Mood and Affect: Mood normal.    ED Results / Procedures / Treatments   Labs (all labs ordered are listed, but only abnormal results are displayed) Labs Reviewed  BASIC METABOLIC PANEL  CBC  PREGNANCY, URINE  TROPONIN I (HIGH SENSITIVITY)  TROPONIN I (HIGH SENSITIVITY)    EKG None  Radiology DG Chest 2 View  Result Date: 06/07/2021 CLINICAL DATA:  Chest pain EXAM: CHEST - 2 VIEW COMPARISON:  06/04/2017 FINDINGS: The heart size and mediastinal contours are within normal limits. Both lungs are clear. The visualized skeletal structures are unremarkable. IMPRESSION: No acute abnormality of the lungs. Electronically Signed   By: Eddie Candle M.D.   On: 06/07/2021 16:37   CT Head Wo Contrast  Result Date: 06/07/2021 CLINICAL DATA:  Headache EXAM: CT HEAD WITHOUT CONTRAST TECHNIQUE: Contiguous axial images were obtained from the base of the skull through the vertex without intravenous contrast. COMPARISON:  None FINDINGS: Brain: No acute intracranial abnormality. Specifically, no hemorrhage, hydrocephalus, mass lesion, acute infarction, or significant intracranial injury. Vascular: No hyperdense vessel or unexpected calcification. Skull: No acute calvarial abnormality. Sinuses/Orbits: No  acute findings Other: None IMPRESSION: No acute intracranial abnormality. Electronically Signed   By: Rolm Baptise M.D.   On: 06/07/2021 19:30    Procedures Procedures   Medications Ordered in ED Medications  sodium chloride 0.9 % bolus 1,000 mL (1,000 mLs Intravenous New Bag/Given 06/07/21 1854)  ondansetron (ZOFRAN-ODT) disintegrating tablet 4 mg ( Oral Given 06/07/21 1854)    ED Course  I have reviewed the triage vital signs and the nursing notes.  Pertinent labs & imaging results that were available during my care of the patient were reviewed by me and considered in my medical decision making (see chart for details).    MDM Rules/Calculators/A&P                           55 year old female with impacted left upper tooth presents to the emergency department with headache, nausea and an episode of chest pressure.  She has nothing to eat or drink today and feels lightheaded. Patient is supposed to be on antibiotics for the tooth which she has not started today.  There is no concerning oral swelling.  She is afebrile, VSS.  Blood work is reassuring.  From a chest heaviness standpoint the EKG is normal, tropes are negative with no delta, low suspicion for ACS.  Head CT is negative.  I believe her symptoms are most likely secondary to this impacted infected tooth.  Instructed to restart her antibiotics.  After medication she feels back to baseline, she is tolerating p.o. we will call her dentist tomorrow.  Patient at this time appears safe and stable  for discharge and will be treated as an outpatient.  Discharge plan and strict return to ED precautions discussed, patient verbalizes understanding and agreement.  Final Clinical Impression(s) / ED Diagnoses Final diagnoses:  None    Rx / DC Orders ED Discharge Orders     None        Lorelle Gibbs, DO 06/07/21 2146

## 2021-06-18 ENCOUNTER — Encounter: Payer: Self-pay | Admitting: Family Medicine

## 2021-06-18 ENCOUNTER — Ambulatory Visit (INDEPENDENT_AMBULATORY_CARE_PROVIDER_SITE_OTHER): Payer: Medicare Other | Admitting: Family Medicine

## 2021-06-18 ENCOUNTER — Other Ambulatory Visit: Payer: Self-pay

## 2021-06-18 ENCOUNTER — Other Ambulatory Visit (HOSPITAL_COMMUNITY)
Admission: RE | Admit: 2021-06-18 | Discharge: 2021-06-18 | Disposition: A | Discharge status: Home / Self Care | Payer: Medicare Other | Source: Ambulatory Visit | Attending: Family Medicine | Admitting: Family Medicine

## 2021-06-18 VITALS — BP 122/76 | HR 69 | Temp 98.0°F | Resp 16 | Wt 128.8 lb

## 2021-06-18 DIAGNOSIS — N760 Acute vaginitis: Secondary | ICD-10-CM | POA: Insufficient documentation

## 2021-06-18 DIAGNOSIS — K581 Irritable bowel syndrome with constipation: Secondary | ICD-10-CM

## 2021-06-18 DIAGNOSIS — R300 Dysuria: Secondary | ICD-10-CM

## 2021-06-18 DIAGNOSIS — I25118 Atherosclerotic heart disease of native coronary artery with other forms of angina pectoris: Secondary | ICD-10-CM | POA: Diagnosis not present

## 2021-06-18 DIAGNOSIS — E785 Hyperlipidemia, unspecified: Secondary | ICD-10-CM | POA: Diagnosis not present

## 2021-06-18 DIAGNOSIS — R000 Tachycardia, unspecified: Secondary | ICD-10-CM

## 2021-06-18 DIAGNOSIS — R350 Frequency of micturition: Secondary | ICD-10-CM

## 2021-06-18 DIAGNOSIS — K219 Gastro-esophageal reflux disease without esophagitis: Secondary | ICD-10-CM

## 2021-06-18 DIAGNOSIS — R1310 Dysphagia, unspecified: Secondary | ICD-10-CM

## 2021-06-18 DIAGNOSIS — Z0000 Encounter for general adult medical examination without abnormal findings: Secondary | ICD-10-CM

## 2021-06-18 LAB — POC URINALSYSI DIPSTICK (AUTOMATED)
Bilirubin, UA: NEGATIVE
Blood, UA: NEGATIVE
Glucose, UA: NEGATIVE
Ketones, UA: NEGATIVE
Leukocytes, UA: NEGATIVE
Nitrite, UA: NEGATIVE
Protein, UA: NEGATIVE
Spec Grav, UA: <=1.005 — AB (ref 1.010–1.025)
Urobilinogen, UA: 0.2 E.U./dL
pH, UA: 6 (ref 5.0–8.0)

## 2021-06-18 MED ORDER — FLUCONAZOLE 150 MG PO TABS: 150.0000 mg | ORAL_TABLET | ORAL | 1 refills | Status: DC

## 2021-06-18 NOTE — Patient Instructions (Signed)
[employer] multistrain probiotic  Vaginal Yeast Infection, Adult  Vaginal yeast infection is a condition that causes vaginal discharge as well as soreness, swelling, and redness (inflammation) of the vagina. This is a common condition. Some women get this infectionfrequently. What are the causes? This condition is caused by a change in the normal balance of the yeast (candida) and bacteria that live in the vagina. This change causes an overgrowth ofyeast, which causes the inflammation. What increases the risk? The condition is more likely to develop in women who: Take antibiotic medicines. Have diabetes. Take birth control pills. Are pregnant. Douche often. Have a weak body defense system (immune system). Have been taking steroid medicines for a long time. Frequently wear tight clothing. What are the signs or symptoms? Symptoms of this condition include: White, thick, creamy vaginal discharge. Swelling, itching, redness, and irritation of the vagina. The lips of the vagina (vulva) may be affected as well. Pain or a burning feeling while urinating. Pain during sex. How is this diagnosed? This condition is diagnosed based on: Your medical history. A physical exam. A pelvic exam. Your health care provider will examine a sample of your vaginal discharge under a microscope. Your health care provider may send this sample for testing to confirm the diagnosis. How is this treated? This condition is treated with medicine. Medicines may be over-the-counter or prescription. You may be told to use one or more of the following: Medicine that is taken by mouth (orally). Medicine that is applied as a cream (topically). Medicine that is inserted directly into the vagina (suppository). Follow these instructions at home:  Lifestyle Do not have sex until your health care provider approves. Tell your sex partner that you have a yeast infection. That person should go to his or her health care  provider and ask if they should also be treated. Do not wear tight clothes, such as pantyhose or tight pants. Wear breathable cotton underwear. General instructions Take or apply over-the-counter and prescription medicines only as told by your health care provider. Eat more yogurt. This may help to keep your yeast infection from returning. Do not use tampons until your health care provider approves. Try taking a sitz bath to help with discomfort. This is a warm water bath that is taken while you are sitting down. The water should only come up to your hips and should cover your buttocks. Do this 3-4 times per day or as told by your health care provider. Do not douche. If you have diabetes, keep your blood sugar levels under control. Keep all follow-up visits as told by your health care provider. This is important. Contact a health care provider if: You have a fever. Your symptoms go away and then return. Your symptoms do not get better with treatment. Your symptoms get worse. You have new symptoms. You develop blisters in or around your vagina. You have blood coming from your vagina and it is not your menstrual period. You develop pain in your abdomen. Summary Vaginal yeast infection is a condition that causes discharge as well as soreness, swelling, and redness (inflammation) of the vagina. This condition is treated with medicine. Medicines may be over-the-counter or prescription. Take or apply over-the-counter and prescription medicines only as told by your health care provider. Do not douche. Do not have sex or use tampons until your health care provider approves. Contact a health care provider if your symptoms do not get better with treatment or your symptoms go away and then return. This information  is not intended to replace advice given to you by your health care provider. Make sure you discuss any questions you have with your healthcare provider. Document Revised: 10/17/2020  Document Reviewed: 03/15/2018 Elsevier Patient Education  [place].

## 2021-06-18 NOTE — Progress Notes (Signed)
Patient ID: Diane Gutierrez, female    DOB: [birth year]  Age: 55 y.o. MRN: [id number]    Subjective:  Subjective  HPI Gina Cervantez [facility] presents for office visit today for follow up on IBS and acid reflux. She reports experiencing symptoms of dysuria which she suspects might be a UTI. Denies CP/palp/SOB/HA/congestion/fevers or chills c/o. Taking meds as prescribed.  Her husband has recently experienced an MI which is contributing to her stress. She has been also going through other life stresses that are weighing her down. She was refereed to an Chief Financial Officer by her dentist due to a tooth impacting her sinus.  Review of Systems  Constitutional:  Negative for chills, fatigue and fever.  HENT:  Positive for dental problem (tooth impacting sinus) and trouble swallowing. Negative for congestion, rhinorrhea, sinus pressure, sinus pain and sore throat.   Eyes:  Negative for pain.  Respiratory:  Negative for cough and shortness of breath.   Cardiovascular:  Negative for chest pain, palpitations and leg swelling.  Gastrointestinal:  Positive for abdominal pain and diarrhea. Negative for blood in stool and vomiting.  Genitourinary:  Positive for dysuria. Negative for decreased urine volume, flank pain, frequency, vaginal bleeding and vaginal discharge.  Musculoskeletal:  Negative for back pain.  Neurological:  Negative for headaches.  Psychiatric/Behavioral:  The patient is nervous/anxious.    History Past Medical History:  Diagnosis Date   Abdominal pain    Acid reflux    Anxiety    Colicky RLQ abdominal pain 07/07/2019   Fibrocystic breast 07/10/2019   Hiatal hernia    Hyperlipidemia    Leukopenia    Low back pain    Lung nodule    Neck pain    Palpitations    Seasonal allergies    Seizure (HCC)    childhood   Sneezing    SOB (shortness of breath)    Tachycardia    Tachycardia [id number]   Umbilical hernia    Weight gain     She has a past surgical history that includes  Breast surgery; Cholecystectomy; Joint replacement (Left); Neck surgery; Hysteroscopy diagnostic; Breast biopsy (Left); and Abdominal hysterectomy.   Her family history includes Arthritis in her maternal grandfather, maternal grandmother, and mother; Bipolar disorder in her sister; Cancer in her paternal grandfather; Diabetes in her father and sister; Drug abuse in her sister; Hearing loss in her mother; Heart disease in her brother, father, maternal grandfather, maternal grandmother, mother, paternal grandfather, and paternal grandmother; Hyperlipidemia in her father and mother; Hypertension in her father; Other in her father and sister; Sleep apnea in her brother; Thyroid cancer in her mother.She reports that she has never smoked. She has never used smokeless tobacco. She reports previous alcohol use. She reports that she does not use drugs.  Current Outpatient Medications on File Prior to Visit  Medication Sig Dispense Refill   acetaminophen (TYLENOL) 500 MG tablet Take 500 mg by mouth every 6 (six) hours as needed.     Ascorbic Acid (VITAMIN C) 100 MG tablet Take 100 mg by mouth daily.     BIOTIN PO Take by mouth.     Cholecalciferol (VITAMIN D3) 1.25 MG (50000 UT) TABS Take by mouth.     ELDERBERRY PO Take by mouth.     Krill Oil 1000 MG CAPS Take 350 mg by mouth.      metoprolol succinate (TOPROL-XL) 25 MG 24 hr tablet Take 1 tablet (25 mg total) by mouth daily. Take with or immediately following a  meal. 180 tablet 1   Multiple Vitamin (MULTIVITAMIN) tablet Take 1 tablet by mouth daily.     omeprazole (PRILOSEC) 20 MG capsule Take 1 capsule (20 mg total) by mouth daily. 90 capsule 1   promethazine (PHENERGAN) 12.5 MG tablet Take 1 tablet (12.5 mg total) by mouth every 6 (six) hours as needed for nausea or vomiting. 30 tablet 1   ipratropium (ATROVENT) 0.06 % nasal spray Place into the nose.     [DISCONTINUED] metoprolol tartrate (LOPRESSOR) 50 MG tablet Take 1 tablet by mouth 2 hours prior to  Cardiac CT 1 tablet 0   No current facility-administered medications on file prior to visit.     Objective:  Objective  Physical Exam Constitutional:      General: She is not in acute distress.    Appearance: Normal appearance. She is not ill-appearing or toxic-appearing.  HENT:     Head: Normocephalic and atraumatic.     Right Ear: Tympanic membrane, ear canal and external ear normal.     Left Ear: Tympanic membrane, ear canal and external ear normal.     Nose: No congestion or rhinorrhea.  Eyes:     Extraocular Movements: Extraocular movements intact.     Pupils: Pupils are equal, round, and reactive to light.  Cardiovascular:     Rate and Rhythm: Normal rate and regular rhythm.     Pulses: Normal pulses.     Heart sounds: Normal heart sounds. No murmur heard. Pulmonary:     Effort: Pulmonary effort is normal. No respiratory distress.     Breath sounds: Normal breath sounds. No wheezing, rhonchi or rales.  Abdominal:     General: Bowel sounds are normal.     Palpations: Abdomen is soft. There is no mass.     Tenderness: abdominal tenderness There is no guarding.     Hernia: No hernia is present.     Comments: defused tenderness with palpation  Musculoskeletal:        General: Normal range of motion.     Cervical back: Normal range of motion and neck supple.  Skin:    General: Skin is warm and dry.  Neurological:     Mental Status: She is alert and oriented to person, place, and time.  Psychiatric:        Behavior: Behavior normal.   BP 122/76   Pulse 69   Temp 98 F (36.7 C)   Resp 16   Wt 128 lb 12.8 oz (58.4 kg)   SpO2 99%   BMI 21.43 kg/m  Wt Readings from Last 3 Encounters:  06/18/21 128 lb 12.8 oz (58.4 kg)  06/07/21 128 lb (58.1 kg)  02/04/21 142 lb 6.4 oz (64.6 kg)     Lab Results  Component Value Date   WBC 6.2 06/07/2021   HGB 13.5 06/07/2021   HCT 40.4 06/07/2021   PLT 242 06/07/2021   GLUCOSE 98 06/07/2021   CHOL 246 (H) 01/31/2021   TRIG  192.0 (H) 01/31/2021   HDL 39.80 01/31/2021   LDLCALC 168 (H) 01/31/2021   ALT 18 01/31/2021   AST 20 01/31/2021   NA 141 06/07/2021   K 3.5 06/07/2021   CL 104 06/07/2021   CREATININE 0.70 06/07/2021   BUN 8 06/07/2021   CO2 28 06/07/2021   TSH 1.66 01/31/2021    DG Chest 2 View  Result Date: 06/07/2021 CLINICAL DATA:  Chest pain EXAM: CHEST - 2 VIEW COMPARISON:  06/04/2017 FINDINGS: The heart size and mediastinal  contours are within normal limits. Both lungs are clear. The visualized skeletal structures are unremarkable. IMPRESSION: No acute abnormality of the lungs. Electronically Signed   By: Eddie Candle M.D.   On: 06/07/2021 16:37   CT Head Wo Contrast  Result Date: 06/07/2021 CLINICAL DATA:  Headache EXAM: CT HEAD WITHOUT CONTRAST TECHNIQUE: Contiguous axial images were obtained from the base of the skull through the vertex without intravenous contrast. COMPARISON:  None FINDINGS: Brain: No acute intracranial abnormality. Specifically, no hemorrhage, hydrocephalus, mass lesion, acute infarction, or significant intracranial injury. Vascular: No hyperdense vessel or unexpected calcification. Skull: No acute calvarial abnormality. Sinuses/Orbits: No acute findings Other: None IMPRESSION: No acute intracranial abnormality. Electronically Signed   By: Rolm Baptise M.D.   On: 06/07/2021 19:30     Assessment & Plan:  Plan    Meds ordered this encounter  Medications   fluconazole (DIFLUCAN) 150 MG tablet    Sig: Take 1 tablet (150 mg total) by mouth once a week.    Dispense:  2 tablet    Refill:  1     Problem List Items Addressed This Visit     Tachycardia   Relevant Orders   TSH   Hyperlipidemia    encouraged heart healthy diet, avoid trans fats, minimize simple carbs and saturated fats. Increase exercise as tolerated. Check labs today       Relevant Orders   Comprehensive metabolic panel   Lipid panel   Acid reflux   Relevant Orders   TSH   IBS (irritable bowel  syndrome)    Avoid offending foods, start probiotics. Do not eat large meals in late evening and consider raising head of bed. Daily probiotics and fiber supplements       Dysphagia    Has worsened some recently, she will follow up with gastroenterology and take small bites, chew well and take a sip imbetween bites.        Urinary frequency    Some dysuria after taking some Amoxicllin will try treating with Diflucan for yeast and check ancillary testing and send urine culture. Encouraged probiotics and minimize simple carbs in diet.        Other Visit Diagnoses     Urine frequency    -  Primary   Relevant Orders   POCT Urinalysis Dipstick (Automated) (Completed)   Urine Culture   Dysuria       Relevant Orders   POCT Urinalysis Dipstick (Automated) (Completed)   Urine Culture   Acute vaginitis       Relevant Orders   Urine cytology ancillary only([place]) (Completed)   Preventative health care       Relevant Orders   TSH       Follow-up: No follow-ups on file.  I, Suezanne Jacquet, acting as a scribe for Penni Homans, MD, have documented all relevent documentation on behalf of Penni Homans, MD, as directed by Penni Homans, MD while in the presence of Penni Homans, MD.  I, Mosie Lukes, MD personally performed the services described in this documentation. All medical record entries made by the scribe were at my direction and in my presence. I have reviewed the chart and agree that the record reflects my personal performance and is accurate and complete

## 2021-06-18 NOTE — Assessment & Plan Note (Signed)
encouraged heart healthy diet, avoid trans fats, minimize simple carbs and saturated fats. Increase exercise as tolerated. Check labs today

## 2021-06-18 NOTE — Assessment & Plan Note (Addendum)
Some dysuria after taking some Amoxicllin will try treating with Diflucan for yeast and check ancillary testing and send urine culture. Encouraged probiotics and minimize simple carbs in diet.

## 2021-06-19 LAB — URINE CYTOLOGY ANCILLARY ONLY
Bacterial Vaginitis-Urine: NEGATIVE
Candida Urine: NEGATIVE

## 2021-06-19 NOTE — Assessment & Plan Note (Addendum)
Has worsened some recently, she will follow up with gastroenterology and take small bites, chew well and take a sip imbetween bites.

## 2021-06-19 NOTE — Assessment & Plan Note (Signed)
Avoid offending foods, start probiotics. Do not eat large meals in late evening and consider raising head of bed. Daily probiotics and fiber supplements

## 2021-06-21 ENCOUNTER — Telehealth: Payer: Self-pay | Admitting: Interventional Cardiology

## 2021-06-21 NOTE — Telephone Encounter (Signed)
Pt states she will be having oral surgery to have some teeth extracted next week, and she was wondering if tylenol #3 that the DDS prescribed is safe for her to take with her cardiac meds.  Informed the pt that this is perfectly safe to take as directed on the bottle and as prescribed.  This does not interfere with her other cardiac meds. Advised the pt to not take any extra tylenol on top of this prescription, and educated her on max dose of tylenol per day for someone her weight.  Also advised the pt to refrain from driving on tylenol #3. Pt verbalized understanding and agrees with this plan.  Pt was more than gracious for all the assistance provided.

## 2021-06-21 NOTE — Telephone Encounter (Signed)
Pt c/o medication issue:  1. Name of Medication: Tylenol w/Codeine 3  2. How are you currently taking this medication (dosage and times per day)? Patient has not taken yet  3. Are you having a reaction (difficulty breathing--STAT)?   4. What is your medication issue? Patient is having a tooth extracted via oral surgery on Tuesday. The patient wanted to know if it is safe for her to take this medication after she has the tooth removed. Please advise

## 2021-07-22 ENCOUNTER — Other Ambulatory Visit: Payer: Medicare Other

## 2021-07-24 ENCOUNTER — Other Ambulatory Visit (INDEPENDENT_AMBULATORY_CARE_PROVIDER_SITE_OTHER): Payer: Medicare Other

## 2021-07-24 DIAGNOSIS — R000 Tachycardia, unspecified: Secondary | ICD-10-CM | POA: Diagnosis not present

## 2021-07-24 DIAGNOSIS — E785 Hyperlipidemia, unspecified: Secondary | ICD-10-CM | POA: Diagnosis not present

## 2021-07-24 DIAGNOSIS — K219 Gastro-esophageal reflux disease without esophagitis: Secondary | ICD-10-CM | POA: Diagnosis not present

## 2021-07-24 DIAGNOSIS — Z0000 Encounter for general adult medical examination without abnormal findings: Secondary | ICD-10-CM

## 2021-07-24 LAB — TSH: TSH: 1.38 u[IU]/mL (ref 0.35–5.50)

## 2021-07-24 LAB — COMPREHENSIVE METABOLIC PANEL
ALT: 14 U/L (ref 0–35)
AST: 16 U/L (ref 0–37)
Albumin: 4.1 g/dL (ref 3.5–5.2)
Alkaline Phosphatase: 64 U/L (ref 39–117)
BUN: 11 mg/dL (ref 6–23)
CO2: 28 mEq/L (ref 19–32)
Calcium: 9.3 mg/dL (ref 8.4–10.5)
Chloride: 103 mEq/L (ref 96–112)
Creatinine, Ser: 0.78 mg/dL (ref 0.40–1.20)
GFR: 85.55 mL/min (ref >60.00)
Glucose, Bld: 83 mg/dL (ref 70–99)
Potassium: 4.2 mEq/L (ref 3.5–5.1)
Sodium: 139 mEq/L (ref 135–145)
Total Bilirubin: 0.7 mg/dL (ref 0.2–1.2)
Total Protein: 6.6 g/dL (ref 6.0–8.3)

## 2021-08-08 ENCOUNTER — Other Ambulatory Visit: Payer: Self-pay

## 2021-08-08 ENCOUNTER — Encounter: Payer: Self-pay | Admitting: Family Medicine

## 2021-08-08 ENCOUNTER — Ambulatory Visit (INDEPENDENT_AMBULATORY_CARE_PROVIDER_SITE_OTHER): Payer: Medicare Other | Admitting: Family Medicine

## 2021-08-08 VITALS — BP 104/68 | HR 63 | Temp 98.2°F | Resp 16 | Ht 65.0 in | Wt 127.2 lb

## 2021-08-08 DIAGNOSIS — R079 Chest pain, unspecified: Secondary | ICD-10-CM

## 2021-08-08 DIAGNOSIS — I25118 Atherosclerotic heart disease of native coronary artery with other forms of angina pectoris: Secondary | ICD-10-CM

## 2021-08-08 DIAGNOSIS — F419 Anxiety disorder, unspecified: Secondary | ICD-10-CM

## 2021-08-08 DIAGNOSIS — N6019 Diffuse cystic mastopathy of unspecified breast: Secondary | ICD-10-CM

## 2021-08-08 DIAGNOSIS — N63 Unspecified lump in unspecified breast: Secondary | ICD-10-CM | POA: Diagnosis not present

## 2021-08-08 DIAGNOSIS — K581 Irritable bowel syndrome with constipation: Secondary | ICD-10-CM

## 2021-08-08 DIAGNOSIS — E785 Hyperlipidemia, unspecified: Secondary | ICD-10-CM | POA: Diagnosis not present

## 2021-08-08 DIAGNOSIS — N649 Disorder of breast, unspecified: Secondary | ICD-10-CM

## 2021-08-08 DIAGNOSIS — R072 Precordial pain: Secondary | ICD-10-CM

## 2021-08-08 DIAGNOSIS — R350 Frequency of micturition: Secondary | ICD-10-CM

## 2021-08-08 DIAGNOSIS — M542 Cervicalgia: Secondary | ICD-10-CM | POA: Diagnosis not present

## 2021-08-08 DIAGNOSIS — R300 Dysuria: Secondary | ICD-10-CM

## 2021-08-08 DIAGNOSIS — K219 Gastro-esophageal reflux disease without esophagitis: Secondary | ICD-10-CM

## 2021-08-08 HISTORY — DX: Precordial pain: R07.2

## 2021-08-08 MED ORDER — NITROGLYCERIN 0.4 MG SL SUBL: 0.4000 mg | SUBLINGUAL_TABLET | SUBLINGUAL | 1 refills | Status: DC | PRN

## 2021-08-08 MED ORDER — PROMETHAZINE HCL 12.5 MG PO TABS: 12.5000 mg | ORAL_TABLET | Freq: Four times a day (QID) | ORAL | 3 refills | Status: DC | PRN

## 2021-08-08 NOTE — Patient Instructions (Addendum)
Consider an SSRI ie Sertraline, Fluoxetine or Escitalopram for anxiety and depression Call [place] for [facility] given new symptom  Preventive Care 41-55 Years Old, Female Preventive care refers to lifestyle choices and visits with your health care provider that can promote health and wellness. This includes: A yearly physical exam. This is also called an annual wellness visit. Regular dental and eye exams. Immunizations. Screening for certain conditions. Healthy lifestyle choices, such as: Eating a healthy diet. Getting regular exercise. Not using drugs or products that contain nicotine and tobacco. Limiting alcohol use. What can I expect for my preventive care visit? Physical exam Your health care provider will check your: Height and weight. These may be used to calculate your BMI (body mass index). BMI is a measurement that tells if you are at a healthy weight. Heart rate and blood pressure. Body temperature. Skin for abnormal spots. Counseling Your health care provider may ask you questions about your: Past medical problems. Family's medical history. Alcohol, tobacco, and drug use. Emotional well-being. Home life and relationship well-being. Sexual activity. Diet, exercise, and sleep habits. Work and work Statistician. Access to firearms. Method of birth control. Menstrual cycle. Pregnancy history. What immunizations do I need? Vaccines are usually given at various ages, according to a schedule. Your health care provider will recommend vaccines for you based on your age, medical history, and lifestyle or other factors, such as travel or where you work. What tests do I need? Blood tests Lipid and cholesterol levels. These may be checked every 5 years, or more often if you are over 26 years old. Hepatitis C test. Hepatitis B test. Screening Lung cancer screening. You may have this screening every year starting at age 12 if you have a 30-pack-year history of  smoking and currently smoke or have quit within the past 15 years. Colorectal cancer screening. All adults should have this screening starting at age 55 and continuing until age 61. Your health care provider may recommend screening at age 80 if you are at increased risk. You will have tests every 1-10 years, depending on your results and the type of screening test. Diabetes screening. This is done by checking your blood sugar (glucose) after you have not eaten for a while (fasting). You may have this done every 1-3 years. Mammogram. This may be done every 1-2 years. Talk with your health care provider about when you should start having regular mammograms. This may depend on whether you have a family history of breast cancer. BRCA-related cancer screening. This may be done if you have a family history of breast, ovarian, tubal, or peritoneal cancers. Pelvic exam and Pap test. This may be done every 3 years starting at age 61. Starting at age 10, this may be done every 5 years if you have a Pap test in combination with an HPV test. Other tests STD (sexually transmitted disease) testing, if you are at risk. Bone density scan. This is done to screen for osteoporosis. You may have this scan if you are at high risk for osteoporosis. Talk with your health care provider about your test results, treatment options, and if necessary, the need for more tests. Follow these instructions at home: Eating and drinking  Eat a diet that includes fresh fruits and vegetables, whole grains, lean protein, and low-fat dairy products. Take vitamin and mineral supplements as recommended by your health care provider. Do not drink alcohol if: Your health care provider tells you not to drink. You are pregnant, may  be pregnant, or are planning to become pregnant. If you drink alcohol: Limit how much you have to 0-1 drink a day. Be aware of how much alcohol is in your drink. In the [place] one drink equals one 12 oz  bottle of beer (355 mL), one 5 oz glass of wine (148 mL), or one 1 oz glass of hard liquor (44 mL). Lifestyle Take daily care of your teeth and gums. Brush your teeth every morning and night with fluoride toothpaste. Floss one time each day. Stay active. Exercise for at least 30 minutes 5 or more days each week. Do not use any products that contain nicotine or tobacco, such as cigarettes, e-cigarettes, and chewing tobacco. If you need help quitting, ask your health care provider. Do not use drugs. If you are sexually active, practice safe sex. Use a condom or other form of protection to prevent STIs (sexually transmitted infections). If you do not wish to become pregnant, use a form of birth control. If you plan to become pregnant, see your health care provider for a prepregnancy visit. If told by your health care provider, take low-dose aspirin daily starting at age 63. Find healthy ways to cope with stress, such as: Meditation, yoga, or listening to music. Journaling. Talking to a trusted person. Spending time with friends and family. Safety Always wear your seat belt while driving or riding in a vehicle. Do not drive: If you have been drinking alcohol. Do not ride with someone who has been drinking. When you are tired or distracted. While texting. Wear a helmet and other protective equipment during sports activities. If you have firearms in your house, make sure you follow all gun safety procedures. What's next? Visit your health care provider once a year for an annual wellness visit. Ask your health care provider how often you should have your eyes and teeth checked. Stay up to date on all vaccines. This information is not intended to replace advice given to you by your health care provider. Make sure you discuss any questions you have with your health care provider. Document Revised: 01/04/2021 Document Reviewed: 07/08/2018 Elsevier Patient Education  2022 [employer].

## 2021-08-08 NOTE — Assessment & Plan Note (Signed)
Encourage heart healthy diet such as MIND or DASH diet, increase exercise, avoid trans fats, simple carbohydrates and processed foods, consider a krill or fish or flaxseed oil cap daily.  °

## 2021-08-08 NOTE — Assessment & Plan Note (Signed)
Stiff on left side worse over the last few months. Previously had surgery with Dr Patrice Paradise of [facility]

## 2021-08-08 NOTE — Assessment & Plan Note (Addendum)
Has episodes much more frequently now and has not contacted her cardiologist. She has several episodes a day, sometimes morning and night, episodes can last 15 minutes and she describes her discomfort as pressure with associated sob, nausea, disequilibrium. She is given NTG prn and if it persists she has to go to ER. Start ECASA 81 mg . Spent an hour with patient discussing current state and formulating a plan of care.

## 2021-08-08 NOTE — Progress Notes (Signed)
Patient ID: [name], female    DOB: [birth year]  Age: 55 y.o. MRN: [id number]    Subjective:   Chief Complaint  Patient presents with   Annual Exam    Dizziness,headaches, chest pain,     Subjective  HPI Diane Gutierrez presents for office visit today for comprehensive physical exam today and follow up on management of chronic concerns. She reports recent episodes of CP, chest pressure, weakness, HA's, and dizziness that have been worsening. She has been also going through a lot of stressful events that have been contributing to her stress. She is still dealing with the aftermath of husband's MI.  She experiences 15 minutes per episode for CP. She also has accompanied symptoms of weakness, nausea, SOB, and HA's. Her CP radiates to the back sometimes and she feels pressure in her chest. CP comes in at the morning and nights sometimes. She has loss of appetite and lost a couple of lbs as a result. She has not seen a cardiologist for evaluation yet. Denies palp/falls/congestion/fevers/GI or GU c/o. Taking meds as prescribed.  She is experiencing numbness in face and lips from recent tooth extraction. She has a stiff neck local to left side that is worsening and she denies any falls.  She is experiencing abdominal pain and comes and goes.   Review of Systems  Constitutional:  Positive for unexpected weight change. Negative for chills, fatigue and fever.  HENT:  Negative for congestion, rhinorrhea, sinus pressure, sinus pain and sore throat.   Eyes:  Negative for pain.  Respiratory:  Positive for shortness of breath. Negative for cough.   Cardiovascular:  Positive for chest pain. Negative for palpitations and leg swelling.  Gastrointestinal:  Positive for abdominal pain and nausea. Negative for blood in stool, diarrhea and vomiting.  Genitourinary:  Negative for decreased urine volume, flank pain, frequency, vaginal bleeding and vaginal discharge.  Musculoskeletal:   Positive for neck stiffness. Negative for back pain.  Neurological:  Positive for dizziness, weakness, numbness and headaches.  Psychiatric/Behavioral:  The patient is nervous/anxious.    History Past Medical History:  Diagnosis Date   Abdominal pain    Acid reflux    Anxiety    Colicky RLQ abdominal pain 07/07/2019   Fibrocystic breast 07/10/2019   Hiatal hernia    Hyperlipidemia    Leukopenia    Low back pain    Lung nodule    Neck pain    Palpitations    Seasonal allergies    Seizure (HCC)    childhood   Sneezing    SOB (shortness of breath)    Tachycardia    Tachycardia 7/00/1749   Umbilical hernia    Weight gain     She has a past surgical history that includes Breast surgery; Cholecystectomy; Joint replacement (Left); Neck surgery; Hysteroscopy diagnostic; Breast biopsy (Left); and Abdominal hysterectomy.   Her family history includes Arthritis in her maternal grandfather, maternal grandmother, and mother; Bipolar disorder in her sister; Cancer in her paternal grandfather; Diabetes in her father and sister; Drug abuse in her sister; Hearing loss in her mother; Heart disease in her brother, father, maternal grandfather, maternal grandmother, mother, paternal grandfather, and paternal grandmother; Hyperlipidemia in her father and mother; Hypertension in her father; Other in her father and sister; Sleep apnea in her brother; Thyroid cancer in her mother.She reports that she has never smoked. She has never used smokeless tobacco. She reports that she does not currently use alcohol. She reports that she does  not use drugs.  Current Outpatient Medications on File Prior to Visit  Medication Sig Dispense Refill   acetaminophen (TYLENOL) 500 MG tablet Take 500 mg by mouth every 6 (six) hours as needed.     Ascorbic Acid (VITAMIN C) 100 MG tablet Take 100 mg by mouth daily.     BIOTIN PO Take by mouth.     Cholecalciferol (VITAMIN D3) 1.25 MG (50000 UT) TABS Take by mouth.      ELDERBERRY PO Take by mouth.     Krill Oil 1000 MG CAPS Take 350 mg by mouth.      metoprolol succinate (TOPROL-XL) 25 MG 24 hr tablet Take 1 tablet (25 mg total) by mouth daily. Take with or immediately following a meal. 180 tablet 1   Multiple Vitamin (MULTIVITAMIN) tablet Take 1 tablet by mouth daily.     omeprazole (PRILOSEC) 20 MG capsule Take 1 capsule (20 mg total) by mouth daily. 90 capsule 1   [DISCONTINUED] metoprolol tartrate (LOPRESSOR) 50 MG tablet Take 1 tablet by mouth 2 hours prior to Cardiac CT 1 tablet 0   No current facility-administered medications on file prior to visit.     Objective:  Objective  Physical Exam Constitutional:      General: She is not in acute distress.    Appearance: Normal appearance. She is not ill-appearing or toxic-appearing.  HENT:     Head: Normocephalic and atraumatic.     Right Ear: Tympanic membrane, ear canal and external ear normal.     Left Ear: Tympanic membrane, ear canal and external ear normal.     Nose: No congestion or rhinorrhea.  Eyes:     Extraocular Movements: Extraocular movements intact.     Right eye: No nystagmus.     Left eye: No nystagmus.     Pupils: Pupils are equal, round, and reactive to light.  Cardiovascular:     Rate and Rhythm: Normal rate and regular rhythm.     Pulses: Normal pulses.     Heart sounds: Normal heart sounds. No murmur heard. Pulmonary:     Effort: Pulmonary effort is normal. No respiratory distress.     Breath sounds: Normal breath sounds. No wheezing, rhonchi or rales.  Abdominal:     General: Bowel sounds are normal.     Palpations: Abdomen is soft. There is no mass.     Tenderness: There is abdominal tenderness. There is no guarding.     Hernia: No hernia is present.  Musculoskeletal:        General: Normal range of motion.     Cervical back: Normal range of motion and neck supple.  Skin:    General: Skin is warm and dry.  Neurological:     Mental Status: She is alert and oriented  to person, place, and time.     Cranial Nerves: No facial asymmetry.     Motor: Motor function is intact. No weakness.     Deep Tendon Reflexes:     Reflex Scores:      Patellar reflexes are 2+ on the right side and 2+ on the left side. Psychiatric:        Behavior: Behavior normal.   BP 104/68   Pulse 63   Temp 98.2 F (36.8 C)   Resp 16   Ht 5\' 5"  (1.651 m)   Wt 127 lb 3.2 oz (57.7 kg)   SpO2 99%   BMI 21.17 kg/m  Wt Readings from Last 3 Encounters:  08/08/21 127  lb 3.2 oz (57.7 kg)  06/18/21 128 lb 12.8 oz (58.4 kg)  06/07/21 128 lb (58.1 kg)     Lab Results  Component Value Date   WBC 6.2 06/07/2021   HGB 13.5 06/07/2021   HCT 40.4 06/07/2021   PLT 242 06/07/2021   GLUCOSE 83 07/24/2021   CHOL 246 (H) 01/31/2021   TRIG 192.0 (H) 01/31/2021   HDL 39.80 01/31/2021   LDLCALC 168 (H) 01/31/2021   ALT 14 07/24/2021   AST 16 07/24/2021   NA 139 07/24/2021   K 4.2 07/24/2021   CL 103 07/24/2021   CREATININE 0.78 07/24/2021   BUN 11 07/24/2021   CO2 28 07/24/2021   TSH 1.38 07/24/2021    No results found.   Assessment & Plan:  Plan    Meds ordered this encounter  Medications   nitroGLYCERIN (NITROSTAT) 0.4 MG SL tablet    Sig: Place 1 tablet (0.4 mg total) under the tongue every 5 (five) minutes as needed for chest pain.    Dispense:  25 tablet    Refill:  1   promethazine (PHENERGAN) 12.5 MG tablet    Sig: Take 1 tablet (12.5 mg total) by mouth every 6 (six) hours as needed for nausea or vomiting.    Dispense:  30 tablet    Refill:  3    Problem List Items Addressed This Visit     Anxiety    Struggling with high levels of stress and anxiety as she struggles with sibbilings with addiction issues. Failing parents in their 71s that rely on her etc. She notes some of her symptoms are contributed to by her stress levels. Declines daily medicine but is strongly encouraged to consider starting an SSRI to help her through this difficult time.       Hyperlipidemia    Encourage heart healthy diet such as MIND or DASH diet, increase exercise, avoid trans fats, simple carbohydrates and processed foods, consider a krill or fish or flaxseed oil cap daily.       Relevant Medications   nitroGLYCERIN (NITROSTAT) 0.4 MG SL tablet   Other Relevant Orders   Lipid panel   Neck pain    Stiff on left side worse over the last few months. Previously had surgery with Dr Patrice Paradise of [facility]       Acid reflux    Avoid offending foods, start probiotics. Do not eat large meals in late evening and consider raising head of bed.       IBS (irritable bowel syndrome)    Continues to be symptomatic and has an appt with Dr Tilman Neat of [facility] soon for evaluation and scoping      Urinary frequency    Check UA and culture      Chest pain - Primary    Has episodes much more frequently now and has not contacted her cardiologist. She has several episodes a day, sometimes morning and night, episodes can last 15 minutes and she describes her discomfort as pressure with associated sob, nausea, disequilibrium. She is given NTG prn and if it persists she has to go to ER. Start ECASA 81 mg . Spent an hour with patient discussing current state and formulating a plan of care.       Relevant Orders   Ambulatory referral to Cardiology   EKG 12-Lead (Completed)   Breast lesion    Has a history breast cysts and she has a new lesion in right breast. Will  order diagnostic MGM and right sided Ultrasound.       Relevant Orders   MM Digital Diagnostic Bilat   US BREAST COMPLETE UNI RIGHT INC AXILLA   Dysuria   Relevant Orders   Urinalysis   Urine Culture   Other Visit Diagnoses     Breast mass in female       Relevant Orders   MM Digital Diagnostic Bilat   US BREAST COMPLETE UNI RIGHT INC AXILLA   Fibrocystic breast changes, unspecified laterality       Relevant Orders   MM Digital Diagnostic Bilat   US BREAST COMPLETE UNI RIGHT  INC AXILLA   Urine frequency       Relevant Orders   Urinalysis   Urine Culture       Follow-up: Return in about 4 months (around 12/08/2021), or lab appt 10/3 or soon there after then a f/u here in.  I, Suezanne Jacquet, acting as a scribe for Penni Homans, MD, have documented all relevent documentation on behalf of Penni Homans, MD, as directed by Penni Homans, MD while in the presence of Penni Homans, MD. DO:08/09/21.  I, Mosie Lukes, MD personally performed the services described in this documentation. All medical record entries made by the scribe were at my direction and in my presence. I have reviewed the chart and agree that the record reflects my personal performance and is accurate and complete

## 2021-08-09 DIAGNOSIS — N649 Disorder of breast, unspecified: Secondary | ICD-10-CM | POA: Insufficient documentation

## 2021-08-09 DIAGNOSIS — R300 Dysuria: Secondary | ICD-10-CM | POA: Insufficient documentation

## 2021-08-09 NOTE — Assessment & Plan Note (Signed)
Struggling with high levels of stress and anxiety as she struggles with sibbilings with addiction issues. Failing parents in their 30s that rely on her etc. She notes some of her symptoms are contributed to by her stress levels. Declines daily medicine but is strongly encouraged to consider starting an SSRI to help her through this difficult time.

## 2021-08-09 NOTE — Assessment & Plan Note (Addendum)
Continues to be symptomatic and has an appt with Dr Tilman Neat of [facility] soon for evaluation and scoping

## 2021-08-09 NOTE — Assessment & Plan Note (Signed)
Check UA and culture 

## 2021-08-09 NOTE — Assessment & Plan Note (Signed)
Avoid offending foods, start probiotics. Do not eat large meals in late evening and consider raising head of bed.  

## 2021-08-09 NOTE — Assessment & Plan Note (Signed)
Has a history breast cysts and she has a new lesion in right breast. Will order diagnostic MGM and right sided Ultrasound.

## 2021-08-12 ENCOUNTER — Encounter: Payer: Self-pay | Admitting: Interventional Cardiology

## 2021-08-12 NOTE — Telephone Encounter (Signed)
Error. No encounter needed

## 2021-08-13 NOTE — Progress Notes (Signed)
Cardiology Office Note:    Date:  08/14/2021   ID:  Diane Gutierrez, DOB 1966-07-05, MRN [id number]  PCP:  Mosie Lukes, MD   [facility] Providers Cardiologist:  Larae Grooms, MD     Referring MD: Mosie Lukes, MD   Chief Complaint:  Chest Pain    Patient Profile:   Diane Gutierrez is a 55 y.o. female with:  Palpitations  Monitor in past with rare PVCs FHx of CAD CT in 2021: CAC score 0, no CAD  GERD  Hyperlipidemia  Anxiety   Prior CV studies: LONG TERM MONITOR (8-14 DAYS) INTERPRETATION 04/18/2021 Narrative  Normal sinus rhythm with rare PACs and PVCs.  No sustained arrhythmias.  No atrial fibrillation.  Coronary CTA 05/15/20 IMPRESSION: 1. No evidence of CAD, CADRADS = 0. 2. Coronary calcium score of 0. This was 0 percentile for age and sex matched control. 3. Normal coronary origin with right dominance.  Echocardiogram 03/25/18 ([place]) Summary  Normal left ventricular size and systolic function with no appreciable  segmental abnormality.  Ejection fraction is visually estimated at 72-62%  Normal diastolic function.  Normal size left atrium.  Mild mitral regurgitation.  Mild tricuspid regurgitation.  RVSP 21 mm Hg.   History of Present Illness: Diane Gutierrez was last seen by Dr. Irish Lack in 1/22.  She recently was seen by primary care.  The pt noted symptoms of more frequent chest pain and is referred back for evaluation.  She is here alone.  She is under a lot of stress.  Her husband had a myocardial infarction recently.  Dr. Tamala Julian did his heart catheterization.  She also takes care of her parents who are ill.  She has a lot of issues with GERD as well as esophageal stricture.  She has had esophageal dilation in the past.  Over the past couple weeks, she has had occasional episodes of chest pressure.  This will occur at rest or with exertion.  She does feel short of breath at times.  She has to sleep on an incline mainly  because of prior neck surgery.  She does feel like she is more short of breath if she lays flat.  She has not had any swelling or sudden weight gain.  She has not had syncope.  She does note dizziness described as spinning.  She can reproduce this with head position changes.  Her symptoms sound consistent with vertigo.    Past Medical History:  Diagnosis Date   Abdominal pain    Acid reflux    Anxiety    Colicky RLQ abdominal pain 07/07/2019   Fibrocystic breast 07/10/2019   Hiatal hernia    Hyperlipidemia    Leukopenia    Low back pain    Lung nodule    Neck pain    Palpitations    Seasonal allergies    Seizure (HCC)    childhood   Sneezing    SOB (shortness of breath)    Tachycardia    Tachycardia 0/35/5974   Umbilical hernia    Weight gain    Current Medications: Current Meds  Medication Sig   acetaminophen (TYLENOL) 500 MG tablet Take 500 mg by mouth every 6 (six) hours as needed for mild pain or headache.   Ascorbic Acid (VITAMIN C) 100 MG tablet Take 100 mg by mouth daily.   BIOTIN PO Take 1 tablet by mouth daily.   Cholecalciferol (VITAMIN D3) 1.25 MG (50000 UT) TABS Take 1 tablet by mouth  every other day.   ELDERBERRY PO Take 1 tablet by mouth daily.   Krill Oil 1000 MG CAPS Take 350 mg by mouth.    metoprolol succinate (TOPROL-XL) 25 MG 24 hr tablet Take 1 tablet (25 mg total) by mouth daily. Take with or immediately following a meal.   Multiple Vitamin (MULTIVITAMIN) tablet Take 1 tablet by mouth daily.   nitroGLYCERIN (NITROSTAT) 0.4 MG SL tablet Place 1 tablet (0.4 mg total) under the tongue every 5 (five) minutes as needed for chest pain.   omeprazole (PRILOSEC) 20 MG capsule Take 1 capsule (20 mg total) by mouth daily.   promethazine (PHENERGAN) 12.5 MG tablet Take 1 tablet (12.5 mg total) by mouth every 6 (six) hours as needed for nausea or vomiting.    Allergies:   Prednisone and Ibuprofen   Social History   Tobacco Use   Smoking status: Never   Smokeless  tobacco: Never  Vaping Use   Vaping Use: Never used  Substance Use Topics   Alcohol use: Not Currently   Drug use: Never    Family Hx: The patient's family history includes Arthritis in her maternal grandfather, maternal grandmother, and mother; Bipolar disorder in her sister; Cancer in her paternal grandfather; Diabetes in her father and sister; Drug abuse in her sister; Hearing loss in her mother; Heart disease in her brother, father, maternal grandfather, maternal grandmother, mother, paternal grandfather, and paternal grandmother; Hyperlipidemia in her father and mother; Hypertension in her father; Other in her father and sister; Sleep apnea in her brother; Thyroid cancer in her mother. There is no history of Colon cancer, Esophageal cancer, Rectal cancer, or Stomach cancer.  Review of Systems  Constitutional: Negative for fever.  HENT:         Recent dental abscess; tooth extracted  Gastrointestinal:  Negative for hematochezia.  Genitourinary:  Negative for hematuria.    EKGs/Labs/Other Test Reviewed:    EKG:  EKG is not ordered today.  The ekg ordered today demonstrates n/a EKG from 08/08/2021 was personally reviewed and demonstrates sinus rhythm, heart rate 67, normal axis, no ST-T wave changes, QTC 414, no change from prior tracing  Recent Labs: 06/07/2021: Hemoglobin 13.5; Platelets 242 07/24/2021: ALT 14; BUN 11; Creatinine, Ser 0.78; Potassium 4.2; Sodium 139; TSH 1.38   Recent Lipid Panel Lab Results  Component Value Date/Time   CHOL 246 (H) 01/31/2021 10:22 AM   TRIG 192.0 (H) 01/31/2021 10:22 AM   HDL 39.80 01/31/2021 10:22 AM   LDLCALC 168 (H) 01/31/2021 10:22 AM   LDLCALC 159 (H) 08/16/2020 09:34 AM     Risk Assessment/Calculations:          Physical Exam:    VS:  BP 124/70   Pulse 65   Ht 5\' 5"  (1.651 m)   Wt 128 lb 9.6 oz (58.3 kg)   SpO2 98%   BMI 21.40 kg/m     Wt Readings from Last 3 Encounters:  08/14/21 128 lb 9.6 oz (58.3 kg)  08/08/21 127 lb  3.2 oz (57.7 kg)  06/18/21 128 lb 12.8 oz (58.4 kg)    Constitutional:      Appearance: Healthy appearance. Not in distress.  Neck:     Vascular: JVD normal.  Pulmonary:     Effort: Pulmonary effort is normal.     Breath sounds: No wheezing. No rales.  Cardiovascular:     Normal rate. Regular rhythm. Normal S1. Normal S2.      Murmurs: There is no murmur.  Edema:  Peripheral edema absent.  Abdominal:     Palpations: Abdomen is soft. There is no hepatomegaly.  Skin:    General: Skin is warm and dry.  Neurological:     Mental Status: Alert and oriented to person, place and time.     Cranial Nerves: Cranial nerves are intact.       ASSESSMENT & PLAN:   1. Precordial chest pain 2. Shortness of breath As noted, she has had episodes of chest discomfort described as pressure over the past few weeks.  This comes on at rest and with exertion.  She has some associated shortness of breath, sweatiness and has also noted some scapular pain in the past.  She has had cholecystectomy.  She does have significant issues of acid reflux.  Prior CT scan did demonstrate hiatal hernia.  She has had to have her esophagus stretched in the past.  She sees her gastroenterologist next month and will undergo endoscopy and colonoscopy.  She had a CT last year that demonstrated no CAD and a calcium score of 0.  Her recent EKG is normal.  Given the description of her symptoms, it is possible that she may be experiencing coronary vasospasm.  I also question if she is having esophageal spasm in the setting of significant GI issues.  She was given nitroglycerin by her PCP.  Have asked her to use this if she has recurrent chest symptoms.  If this provides relief, she knows to contact us.  At that point, I would try her on a very low-dose of long-acting nitrates.  Since her CT scan was negative last year, I do not think she needs aspirin.  I have asked her to take her omeprazole 20 mg daily for 2 weeks and then resume as  needed dosing.  Since she has had some shortness of breath, I will obtain an echocardiogram.  If this is normal, she will not need further testing at this point in time.  Follow-up with Dr. Irish Lack in 3 months.  3. PVC (premature ventricular contraction) Continue metoprolol succinate 25 mg daily.  4. Gastroesophageal reflux disease without esophagitis As noted, she sees her gastroenterologist next month.  I have asked her to take omeprazole daily for 2 weeks and then resume as needed dosing.            Dispo:  Return in about 3 months (around 11/14/2021) for Routine Follow Up with Dr. Irish Lack.   Medication Adjustments/Labs and Tests Ordered: Current medicines are reviewed at length with the patient today.  Concerns regarding medicines are outlined above.  Tests Ordered: Orders Placed This Encounter  Procedures   ECHOCARDIOGRAM COMPLETE   Medication Changes: No orders of the defined types were placed in this encounter.  Signed, Richardson Dopp, PA-C  08/14/2021 1:01 PM    [facility] [place], [place], [place]  [address] Phone: [phone]; Fax: [phone]

## 2021-08-14 ENCOUNTER — Other Ambulatory Visit: Payer: Self-pay

## 2021-08-14 ENCOUNTER — Encounter: Payer: Self-pay | Admitting: Physician Assistant

## 2021-08-14 ENCOUNTER — Ambulatory Visit (INDEPENDENT_AMBULATORY_CARE_PROVIDER_SITE_OTHER): Payer: Medicare Other | Admitting: Physician Assistant

## 2021-08-14 VITALS — BP 124/70 | HR 65 | Ht 65.0 in | Wt 128.6 lb

## 2021-08-14 DIAGNOSIS — E782 Mixed hyperlipidemia: Secondary | ICD-10-CM

## 2021-08-14 DIAGNOSIS — I25118 Atherosclerotic heart disease of native coronary artery with other forms of angina pectoris: Secondary | ICD-10-CM

## 2021-08-14 DIAGNOSIS — R072 Precordial pain: Secondary | ICD-10-CM

## 2021-08-14 DIAGNOSIS — K219 Gastro-esophageal reflux disease without esophagitis: Secondary | ICD-10-CM

## 2021-08-14 DIAGNOSIS — I493 Ventricular premature depolarization: Secondary | ICD-10-CM | POA: Diagnosis not present

## 2021-08-14 DIAGNOSIS — R0602 Shortness of breath: Secondary | ICD-10-CM

## 2021-08-14 NOTE — Patient Instructions (Signed)
Medication Instructions:   TAKE Omeprazole one tablet by mouth ( 20 mg ) daily X 2 weeks than go back to as needed.   TAKE Nitro if you have Chest Pain.   *If you need a refill on your cardiac medications before your next appointment, please call your pharmacy*   Lab Work:  -None  If you have labs (blood work) drawn today and your tests are completely normal, you will receive your results only by: [place] (if you have MyChart) OR A paper copy in the mail If you have any lab test that is abnormal or we need to change your treatment, we will call you to review the results.   Testing/Procedures: Your physician has requested that you have an echocardiogram. Echocardiography is a painless test that uses sound waves to create images of your heart. It provides your doctor with information about the size and shape of your heart and how well your heart's chambers and valves are working. This procedure takes approximately one hour. There are no restrictions for this procedure.    Follow-Up: At [facility], you and your health needs are our priority.  As part of our continuing mission to provide you with exceptional heart care, we have created designated Provider Care Teams.  These Care Teams include your primary Cardiologist (physician) and Advanced Practice Providers (APPs -  Physician Assistants and Nurse Practitioners) who all work together to provide you with the care you need, when you need it.  We recommend signing up for the patient portal called "MyChart".  Sign up information is provided on this After Visit Summary.  MyChart is used to connect with patients for Virtual Visits (Telemedicine).  Patients are able to view lab/test results, encounter notes, upcoming appointments, etc.  Non-urgent messages can be sent to your provider as well.   To learn more about what you can do with MyChart, go to [email or web address].    Your next appointment:   3 month(s)  The format for  your next appointment:   In Person  Provider:   Casandra Doffing, MD   Other Instructions Call office at [phone] if Nitro does not help chest pain.

## 2021-08-15 ENCOUNTER — Encounter: Payer: Self-pay | Admitting: Family Medicine

## 2021-08-18 ENCOUNTER — Other Ambulatory Visit: Payer: Self-pay | Admitting: Family Medicine

## 2021-08-18 DIAGNOSIS — R519 Headache, unspecified: Secondary | ICD-10-CM

## 2021-08-18 DIAGNOSIS — M542 Cervicalgia: Secondary | ICD-10-CM

## 2021-08-18 DIAGNOSIS — R42 Dizziness and giddiness: Secondary | ICD-10-CM

## 2021-08-20 ENCOUNTER — Other Ambulatory Visit (INDEPENDENT_AMBULATORY_CARE_PROVIDER_SITE_OTHER): Payer: Medicare Other

## 2021-08-20 ENCOUNTER — Other Ambulatory Visit: Payer: Self-pay

## 2021-08-20 DIAGNOSIS — E785 Hyperlipidemia, unspecified: Secondary | ICD-10-CM | POA: Diagnosis not present

## 2021-08-20 DIAGNOSIS — R072 Precordial pain: Secondary | ICD-10-CM | POA: Diagnosis not present

## 2021-08-20 DIAGNOSIS — R0602 Shortness of breath: Secondary | ICD-10-CM

## 2021-08-20 DIAGNOSIS — I493 Ventricular premature depolarization: Secondary | ICD-10-CM

## 2021-08-20 DIAGNOSIS — K219 Gastro-esophageal reflux disease without esophagitis: Secondary | ICD-10-CM

## 2021-08-20 LAB — LIPID PANEL
Cholesterol: 227 mg/dL — ABNORMAL HIGH (ref 0–200)
HDL: 56.2 mg/dL (ref >39.00)
LDL Cholesterol: 144 mg/dL — ABNORMAL HIGH (ref 0–99)
NonHDL: 170.48
Total CHOL/HDL Ratio: 4
Triglycerides: 134 mg/dL (ref 0.0–149.0)
VLDL: 26.8 mg/dL (ref 0.0–40.0)

## 2021-08-23 ENCOUNTER — Telehealth: Payer: Self-pay | Admitting: Family Medicine

## 2021-08-23 NOTE — Telephone Encounter (Signed)
[facility]: [phone]  Received referral on Pt. And stated that they do not specialize in the evaluation that has been requested

## 2021-08-23 NOTE — Telephone Encounter (Signed)
Pt aware and will be call back when she finds someone that could help her so we can put her referral in

## 2021-08-28 ENCOUNTER — Ambulatory Visit (HOSPITAL_COMMUNITY): Payer: Medicare Other | Attending: Cardiology

## 2021-08-28 ENCOUNTER — Other Ambulatory Visit: Payer: Self-pay

## 2021-08-28 ENCOUNTER — Encounter: Payer: Self-pay | Admitting: Physician Assistant

## 2021-08-28 DIAGNOSIS — K219 Gastro-esophageal reflux disease without esophagitis: Secondary | ICD-10-CM | POA: Diagnosis present

## 2021-08-28 DIAGNOSIS — R0602 Shortness of breath: Secondary | ICD-10-CM

## 2021-08-28 DIAGNOSIS — I493 Ventricular premature depolarization: Secondary | ICD-10-CM | POA: Diagnosis present

## 2021-08-28 DIAGNOSIS — R072 Precordial pain: Secondary | ICD-10-CM | POA: Diagnosis present

## 2021-08-28 LAB — ECHOCARDIOGRAM COMPLETE
Area-P 1/2: 4.52 cm2
S' Lateral: 2.3 cm

## 2021-08-29 NOTE — Progress Notes (Signed)
Pt has been made aware of normal result and verbalized understanding.  jw

## 2021-09-03 ENCOUNTER — Other Ambulatory Visit: Payer: Self-pay

## 2021-09-03 ENCOUNTER — Encounter: Payer: Self-pay | Admitting: Obstetrics and Gynecology

## 2021-09-03 ENCOUNTER — Ambulatory Visit (INDEPENDENT_AMBULATORY_CARE_PROVIDER_SITE_OTHER): Payer: Medicare Other | Admitting: Obstetrics and Gynecology

## 2021-09-03 VITALS — BP 113/74 | HR 73 | Ht 65.0 in | Wt 128.0 lb

## 2021-09-03 DIAGNOSIS — N393 Stress incontinence (female) (male): Secondary | ICD-10-CM

## 2021-09-03 DIAGNOSIS — N3281 Overactive bladder: Secondary | ICD-10-CM | POA: Diagnosis not present

## 2021-09-03 DIAGNOSIS — N8110 Cystocele, unspecified: Secondary | ICD-10-CM

## 2021-09-03 DIAGNOSIS — N952 Postmenopausal atrophic vaginitis: Secondary | ICD-10-CM | POA: Diagnosis not present

## 2021-09-03 DIAGNOSIS — R159 Full incontinence of feces: Secondary | ICD-10-CM
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
## 2021-09-03 NOTE — Patient Instructions (Signed)
Accidental Bowel Leakage: Our goal is to achieve formed bowel movements daily or every-other-day without leakage.  You may need to try different combinations of the following options to find what works best for you.  Some management options include: Dietary changes (more leafy greens, vegetables and fruits; less processed foods) Fiber supplementation (Metamucil or something with psyllium as active ingredient) Over-the-counter imodium (tablets or liquid) to help solidify the stool and prevent leakage of stool.  

## 2021-09-05 ENCOUNTER — Other Ambulatory Visit: Payer: Self-pay | Admitting: Family Medicine

## 2021-09-05 ENCOUNTER — Other Ambulatory Visit: Payer: Self-pay

## 2021-09-05 ENCOUNTER — Ambulatory Visit
Admission: RE | Admit: 2021-09-05 | Discharge: 2021-09-05 | Disposition: A | Discharge status: Home / Self Care | Payer: Medicare Other | Source: Ambulatory Visit | Attending: Family Medicine | Admitting: Family Medicine

## 2021-09-05 DIAGNOSIS — N649 Disorder of breast, unspecified: Secondary | ICD-10-CM

## 2021-09-05 DIAGNOSIS — N63 Unspecified lump in unspecified breast: Secondary | ICD-10-CM

## 2021-09-05 DIAGNOSIS — N6019 Diffuse cystic mastopathy of unspecified breast: Secondary | ICD-10-CM

## 2021-09-11 ENCOUNTER — Ambulatory Visit
Admission: RE | Admit: 2021-09-11 | Discharge: 2021-09-11 | Disposition: A | Discharge status: Home / Self Care | Payer: Medicare Other | Source: Ambulatory Visit | Attending: Family Medicine | Admitting: Family Medicine

## 2021-09-11 ENCOUNTER — Other Ambulatory Visit: Payer: Self-pay

## 2021-09-11 DIAGNOSIS — N6019 Diffuse cystic mastopathy of unspecified breast: Secondary | ICD-10-CM

## 2021-09-11 DIAGNOSIS — N63 Unspecified lump in unspecified breast: Secondary | ICD-10-CM

## 2021-09-11 DIAGNOSIS — N649 Disorder of breast, unspecified: Secondary | ICD-10-CM

## 2021-09-20 ENCOUNTER — Encounter: Payer: Self-pay | Admitting: Family Medicine

## 2021-09-23 ENCOUNTER — Other Ambulatory Visit: Payer: Self-pay | Admitting: Family Medicine

## 2021-09-23 DIAGNOSIS — R519 Headache, unspecified: Secondary | ICD-10-CM

## 2021-09-23 DIAGNOSIS — G8929 Other chronic pain: Secondary | ICD-10-CM

## 2021-09-23 DIAGNOSIS — M542 Cervicalgia: Secondary | ICD-10-CM

## 2021-09-26 LAB — HM COLONOSCOPY

## 2021-09-27 ENCOUNTER — Encounter: Payer: Self-pay | Admitting: *Deleted

## 2021-10-07 ENCOUNTER — Telehealth: Payer: Self-pay | Admitting: Physical Therapy

## 2021-10-07 NOTE — Telephone Encounter (Signed)
Called patient to see about her coning to the evaluation. Patient will be having surgery to correct a twisted colon. Therapist and patient decided for her to put therapy on hold for now to have the surgery and see what her Biopsies say. She will reschedule after she has recovered from the surgery.  Earlie Counts, PT @1128 /2022@ 9:18 AM

## 2021-10-10 ENCOUNTER — Encounter: Payer: Medicare Other | Admitting: Physical Therapy

## 2021-10-15 ENCOUNTER — Other Ambulatory Visit: Payer: Self-pay | Admitting: Family Medicine

## 2021-10-15 ENCOUNTER — Other Ambulatory Visit: Payer: Self-pay | Admitting: Interventional Cardiology

## 2021-10-16 DIAGNOSIS — R519 Headache, unspecified: Secondary | ICD-10-CM | POA: Diagnosis not present

## 2021-10-16 DIAGNOSIS — R42 Dizziness and giddiness: Secondary | ICD-10-CM | POA: Diagnosis not present

## 2021-10-17 ENCOUNTER — Encounter: Payer: Medicare Other | Admitting: Physical Therapy

## 2021-10-24 ENCOUNTER — Encounter: Payer: Medicare Other | Admitting: Physical Therapy

## 2021-10-25 DIAGNOSIS — R1030 Lower abdominal pain, unspecified: Secondary | ICD-10-CM | POA: Diagnosis not present

## 2021-10-25 DIAGNOSIS — Q438 Other specified congenital malformations of intestine: Secondary | ICD-10-CM | POA: Diagnosis not present

## 2021-10-25 DIAGNOSIS — D369 Benign neoplasm, unspecified site: Secondary | ICD-10-CM | POA: Diagnosis not present

## 2021-11-05 DIAGNOSIS — R519 Headache, unspecified: Secondary | ICD-10-CM | POA: Diagnosis not present

## 2021-11-05 DIAGNOSIS — M542 Cervicalgia: Secondary | ICD-10-CM | POA: Diagnosis not present

## 2021-11-05 DIAGNOSIS — M5412 Radiculopathy, cervical region: Secondary | ICD-10-CM | POA: Diagnosis not present

## 2021-11-05 DIAGNOSIS — G8929 Other chronic pain: Secondary | ICD-10-CM | POA: Diagnosis not present

## 2021-11-05 DIAGNOSIS — R202 Paresthesia of skin: Secondary | ICD-10-CM | POA: Diagnosis not present

## 2021-11-05 DIAGNOSIS — M2578 Osteophyte, vertebrae: Secondary | ICD-10-CM | POA: Diagnosis not present

## 2021-11-07 ENCOUNTER — Encounter: Payer: Medicare Other | Admitting: Physical Therapy

## 2021-11-19 ENCOUNTER — Telehealth: Payer: Self-pay | Admitting: Interventional Cardiology

## 2021-11-19 NOTE — Telephone Encounter (Signed)
Patient states her father is not doing well and she has to travel to [place] unexpectedly. She would like to know if it is alright to convert 01/17 OV to a virtual appointment. Last EKG was on 08/08/21. She has been having issues with her phone and requested a call back on her husbands phone at [phone]. Please advise.

## 2021-11-19 NOTE — Telephone Encounter (Signed)
I spoke with patient and changed visit to my chart video visit. Patient reports she recently had colonoscopy and polyp was removed.

## 2021-11-26 ENCOUNTER — Encounter: Payer: Self-pay | Admitting: Interventional Cardiology

## 2021-11-26 ENCOUNTER — Other Ambulatory Visit: Payer: Self-pay

## 2021-11-26 ENCOUNTER — Telehealth (INDEPENDENT_AMBULATORY_CARE_PROVIDER_SITE_OTHER): Payer: Medicare Other | Admitting: Interventional Cardiology

## 2021-11-26 VITALS — BP 106/78 | HR 79 | Ht 65.0 in | Wt 127.0 lb

## 2021-11-26 DIAGNOSIS — R002 Palpitations: Secondary | ICD-10-CM

## 2021-11-26 DIAGNOSIS — I493 Ventricular premature depolarization: Secondary | ICD-10-CM | POA: Diagnosis not present

## 2021-11-26 DIAGNOSIS — Z01810 Encounter for preprocedural cardiovascular examination: Secondary | ICD-10-CM

## 2021-11-26 DIAGNOSIS — R072 Precordial pain: Secondary | ICD-10-CM

## 2021-11-26 NOTE — Patient Instructions (Signed)
Medication Instructions:  °Your physician recommends that you continue on your current medications as directed. Please refer to the Current Medication list given to you today. ° °*If you need a refill on your cardiac medications before your next appointment, please call your pharmacy* ° ° °Lab Work: °none °If you have labs (blood work) drawn today and your tests are completely normal, you will receive your results only by: °MyChart Message (if you have MyChart) OR °A paper copy in the mail °If you have any lab test that is abnormal or we need to change your treatment, we will call you to review the results. ° ° °Testing/Procedures: °none ° ° °Follow-Up: °At [facility], you and your health needs are our priority.  As part of our continuing mission to provide you with exceptional heart care, we have created designated Provider Care Teams.  These Care Teams include your primary Cardiologist (physician) and Advanced Practice Providers (APPs -  Physician Assistants and Nurse Practitioners) who all work together to provide you with the care you need, when you need it. ° °We recommend signing up for the patient portal called "MyChart".  Sign up information is provided on this After Visit Summary.  MyChart is used to connect with patients for Virtual Visits (Telemedicine).  Patients are able to view lab/test results, encounter notes, upcoming appointments, etc.  Non-urgent messages can be sent to your provider as well.   °To learn more about what you can do with MyChart, go to [email or web address].   ° °Your next appointment:   °6 month(s) ° °The format for your next appointment:   °In Person ° °Provider:   °Jayadeep Varanasi, MD   ° ° °Other Instructions ° ° °

## 2021-11-26 NOTE — Progress Notes (Signed)
Virtual Visit via Video Note   This visit type was conducted due to national recommendations for restrictions regarding the COVID-19 Pandemic (e.g. social distancing) in an effort to limit this patient's exposure and mitigate transmission in our community.  Due to her co-morbid illnesses, this patient is at least at moderate risk for complications without adequate follow up.  This format is felt to be most appropriate for this patient at this time.  All issues noted in this document were discussed and addressed.  A limited physical exam was performed with this format.  Please refer to the patient's chart for her consent to telehealth for [facility].       Date:  11/26/2021   ID:  Diane Gutierrez, DOB 05-Jul-1966, MRN [id number] The patient was identified using 2 identifiers.  Patient Location: Home Provider Location: Office/Clinic   PCP:  Mosie Lukes, MD   [facility] Providers Cardiologist:  Larae Grooms, MD     Evaluation Performed:  Follow-Up Visit  Chief Complaint: Palpitations  History of Present Illness:    Diane Gutierrez is a 56 y.o. female with with:  Palpitations  Monitor in past with rare PVCs FHx of CAD CT in 2021: CAC score 0, no CAD  GERD  Hyperlipidemia  Anxiety    Prior CV studies: LONG TERM MONITOR (8-14 DAYS) INTERPRETATION 04/18/2021 Narrative  Normal sinus rhythm with rare PACs and PVCs.  No sustained arrhythmias.  No atrial fibrillation.   Coronary CTA 05/15/20 IMPRESSION: 1. No evidence of CAD, CADRADS = 0. 2. Coronary calcium score of 0. This was 0 percentile for age and sex matched control. 3. Normal coronary origin with right dominance.   Echocardiogram 03/25/18 ([place]) Summary  Normal left ventricular size and systolic function with no appreciable  segmental abnormality.  Ejection fraction is visually estimated at 86-57%  Normal diastolic function.  Normal size left atrium.  Mild mitral  regurgitation.  Mild tricuspid regurgitation.  RVSP 21 mm Hg.   Husband had an MI in 2022.  She has been under a lot of stress.  Her father has not been doing well and she had to travel to [place] so made this a virtual visit.  She had some more chest discomfort in October 2022 and saw [facility].  Metoprolol taken regularly.  COncern for vasospasm. Long-acting nitrates were considered but NOT started.  Trial of omeprazole was started.  She takes the PPI as needed.   Echo in 2022 showed: "Echocardiogram demonstrates normal heart function (ejection fraction).  There is no significant valve disease."  The patient does not have symptoms concerning for COVID-19 infection (fever, chills, cough, or new shortness of breath).   She will need colon surgery.   She walks regularly.  THere is a concern for a neuro disease like Charcot-Marie-Tooth.  She reports some muscle loss.    Past Medical History:  Diagnosis Date   Abdominal pain    Acid reflux    Anxiety    Colicky RLQ abdominal pain 07/07/2019   Fibrocystic breast 07/10/2019   Hiatal hernia    Hyperlipidemia    Leukopenia    Low back pain    Lung nodule    Neck pain    Palpitations    Seasonal allergies    Seizure (HCC)    childhood   Sneezing    SOB (shortness of breath)    Echo 10/22: EF 60-65, no RWMA, normal RVSF, mild MR   Tachycardia    Tachycardia 07/07/2019  Umbilical hernia    Weight gain    Past Surgical History:  Procedure Laterality Date   BREAST BIOPSY Left    BREAST SURGERY     lumpectomy, cyst aspirated.on left   CHOLECYSTECTOMY     HYSTEROSCOPY DIAGNOSTIC     JOINT REPLACEMENT Left    knee x 2 with screws in place   KNEE SURGERY     NECK SURGERY     Dr Patrice Paradise   ROBOTIC ASSISTED TOTAL HYSTERECTOMY     precancerous, with BSO     Current Meds  Medication Sig   ELDERBERRY PO Take 1 tablet by mouth daily.     Allergies:   Prednisone and Ibuprofen   Social History   Tobacco Use   Smoking status:  Never   Smokeless tobacco: Never  Vaping Use   Vaping Use: Never used  Substance Use Topics   Alcohol use: Not Currently   Drug use: Never     Family Hx: The patient's family history includes Arthritis in her maternal grandfather, maternal grandmother, and mother; Bipolar disorder in her sister; Cancer in her paternal grandfather; Diabetes in her father and sister; Drug abuse in her sister; Hearing loss in her mother; Heart disease in her brother, father, maternal grandfather, maternal grandmother, mother, paternal grandfather, and paternal grandmother; Hyperlipidemia in her father and mother; Hypertension in her father; Other in her father and sister; Sleep apnea in her brother; Thyroid cancer in her mother. There is no history of Colon cancer, Esophageal cancer, Rectal cancer, or Stomach cancer.  ROS:   Please see the history of present illness.    Stress from father's illness All other systems reviewed and are negative.   Prior CV studies:   The following studies were reviewed today:  As above  Labs/Other Tests and Data Reviewed:    EKG:  An ECG dated 08/07/2021 was personally reviewed today and demonstrated:  NSR. No ST changes  Recent Labs: 06/07/2021: Hemoglobin 13.5; Platelets 242 07/24/2021: ALT 14; BUN 11; Creatinine, Ser 0.78; Potassium 4.2; Sodium 139; TSH 1.38   Recent Lipid Panel Lab Results  Component Value Date/Time   CHOL 227 (H) 08/20/2021 08:40 AM   TRIG 134.0 08/20/2021 08:40 AM   HDL 56.20 08/20/2021 08:40 AM   CHOLHDL 4 08/20/2021 08:40 AM   LDLCALC 144 (H) 08/20/2021 08:40 AM   LDLCALC 159 (H) 08/16/2020 09:34 AM    Wt Readings from Last 3 Encounters:  11/26/21 127 lb (57.6 kg)  09/03/21 128 lb (58.1 kg)  08/14/21 128 lb 9.6 oz (58.3 kg)     Risk Assessment/Calculations:          Objective:    Vital Signs:  BP 106/78    Pulse 79    Ht 5\' 5"  (1.651 m)    Wt 127 lb (57.6 kg)    SpO2 99%    BMI 21.13 kg/m    VITAL SIGNS:  reviewed GEN:  no  acute distress RESPIRATORY:  normal respiratory effort, symmetric expansion NEURO:  alert and oriented x 3, no obvious focal deficit PSYCH:  normal affect  ASSESSMENT & PLAN:    Chest pain: She thinks this is more related to stress. Healthy lifestyle recommended.   Based on CTA, I don't think this is cardiac.  Palpitations:  No significant arrhythmias on prior testing. PVCs noted in the past.   Occasional palpitations with stress.  Very shortlived.   Preoperative cardiovascular exam: No further cardiac testing needed before colon surgery and dental surgery.  She wants to see a GI surgeon here.  Significant stress with husband having MI recently and father perhaps being at end of life, per her report.           COVID-19 Education: The signs and symptoms of COVID-19 were discussed with the patient and how to seek care for testing (follow up with PCP or arrange E-visit).  The importance of social distancing was discussed today.  Time:   Today, I have spent  minutes with the patient with telehealth technology discussing the above problems.     Medication Adjustments/Labs and Tests Ordered: Current medicines are reviewed at length with the patient today.  Concerns regarding medicines are outlined above.   Tests Ordered: No orders of the defined types were placed in this encounter.   Medication Changes: No orders of the defined types were placed in this encounter.   Follow Up:  In Person in 7 month(s)  Signed, Larae Grooms, MD  11/26/2021 2:02 PM    [facility]

## 2021-12-04 ENCOUNTER — Ambulatory Visit: Payer: Self-pay | Admitting: Obstetrics and Gynecology

## 2021-12-13 ENCOUNTER — Ambulatory Visit (INDEPENDENT_AMBULATORY_CARE_PROVIDER_SITE_OTHER): Payer: Medicare Other

## 2021-12-13 ENCOUNTER — Telehealth: Payer: Self-pay

## 2021-12-13 VITALS — Ht 65.0 in | Wt 127.0 lb

## 2021-12-13 DIAGNOSIS — R000 Tachycardia, unspecified: Secondary | ICD-10-CM

## 2021-12-13 DIAGNOSIS — Z0000 Encounter for general adult medical examination without abnormal findings: Secondary | ICD-10-CM | POA: Diagnosis not present

## 2021-12-13 DIAGNOSIS — J302 Other seasonal allergic rhinitis: Secondary | ICD-10-CM

## 2021-12-13 DIAGNOSIS — E785 Hyperlipidemia, unspecified: Secondary | ICD-10-CM

## 2021-12-13 NOTE — Progress Notes (Signed)
Subjective:   Diane Gutierrez is a 56 y.o. female who presents for an Initial Medicare Annual Wellness Visit.  I connected with Rosamaria today by telephone and verified that I am speaking with the correct person using two identifiers. Location patient: home Location provider: work Persons participating in the virtual visit: patient, Marine scientist.    I discussed the limitations, risks, security and privacy concerns of performing an evaluation and management service by telephone and the availability of in person appointments. I also discussed with the patient that there may be a patient responsible charge related to this service. The patient expressed understanding and verbally consented to this telephonic visit.    Interactive audio and video telecommunications were attempted between this provider and patient, however failed, due to patient having technical difficulties OR patient did not have access to video capability.  We continued and completed visit with audio only.  Some vital signs may be absent or patient reported.   Time Spent with patient on telephone encounter: 30 minutes   Review of Systems     Cardiac Risk Factors include: advanced age (>80men, >34 women);dyslipidemia;sedentary lifestyle     Objective:    Today's Vitals   12/13/21 1340  Weight: 127 lb (57.6 kg)  Height: 5\' 5"  (1.651 m)  PainSc: 4    Body mass index is 21.13 kg/m.  Advanced Directives 12/13/2021 06/07/2021 04/30/2020  Does Patient Have a Medical Advance Directive? No No No  Does patient want to make changes to medical advance directive? No - Patient declined - -  Would patient like information on creating a medical advance directive? - No - Patient declined -    Current Medications (verified) Outpatient Encounter Medications as of 12/13/2021  Medication Sig   acetaminophen (TYLENOL) 500 MG tablet Take 500 mg by mouth every 6 (six) hours as needed for mild pain or headache.   Ascorbic Acid (VITAMIN  C) 100 MG tablet Take 100 mg by mouth daily.   BIOTIN PO Take 1 tablet by mouth daily.   Cholecalciferol (VITAMIN D3) 1.25 MG (50000 UT) TABS Take 1 tablet by mouth every other day.   ELDERBERRY PO Take 1 tablet by mouth daily.   Krill Oil 1000 MG CAPS Take 350 mg by mouth.    metoprolol succinate (TOPROL-XL) 25 MG 24 hr tablet Take 1 tablet (25 mg total) by mouth daily. With or immediately following a meal.   Multiple Vitamin (MULTIVITAMIN) tablet Take 1 tablet by mouth daily.   nitroGLYCERIN (NITROSTAT) 0.4 MG SL tablet Place 1 tablet (0.4 mg total) under the tongue every 5 (five) minutes as needed for chest pain.   omeprazole (PRILOSEC) 20 MG capsule TAKE ONE CAPSULE BY MOUTH ONE TIME DAILY **MUST CALL DR. FOR APPOINTMENT**   promethazine (PHENERGAN) 12.5 MG tablet Take 1 tablet (12.5 mg total) by mouth every 6 (six) hours as needed for nausea or vomiting.   [DISCONTINUED] metoprolol tartrate (LOPRESSOR) 50 MG tablet Take 1 tablet by mouth 2 hours prior to Cardiac CT   No facility-administered encounter medications on file as of 12/13/2021.    Allergies (verified) Prednisone and Ibuprofen   History: Past Medical History:  Diagnosis Date   Abdominal pain    Acid reflux    Anxiety    Colicky RLQ abdominal pain 07/07/2019   Fibrocystic breast 07/10/2019   Hiatal hernia    Hyperlipidemia    Leukopenia    Low back pain    Lung nodule    Neck pain  Palpitations    Seasonal allergies    Seizure (HCC)    childhood   Sneezing    SOB (shortness of breath)    Echo 10/22: EF 60-65, no RWMA, normal RVSF, mild MR   Tachycardia    Tachycardia 09/81/1914   Umbilical hernia    Weight gain    Past Surgical History:  Procedure Laterality Date   BREAST BIOPSY Left    BREAST SURGERY     lumpectomy, cyst aspirated.on left   CHOLECYSTECTOMY     HYSTEROSCOPY DIAGNOSTIC     JOINT REPLACEMENT Left    knee x 2 with screws in place   KNEE SURGERY     NECK SURGERY     Dr Patrice Paradise   ROBOTIC  ASSISTED TOTAL HYSTERECTOMY     precancerous, with BSO   Family History  Problem Relation Age of Onset   Arthritis Mother    Hearing loss Mother    Heart disease Mother        MI   Hyperlipidemia Mother    Thyroid cancer Mother    Hypertension Father    Heart disease Father        s/p CABG   Diabetes Father    Hyperlipidemia Father    Other Father    Drug abuse Sister        crack cocaine   Other Sister    Diabetes Sister        smoker   Bipolar disorder Sister    Heart disease Brother    Sleep apnea Brother    Arthritis Maternal Grandmother    Heart disease Maternal Grandmother    Arthritis Maternal Grandfather    Heart disease Maternal Grandfather    Heart disease Paternal Grandmother    Cancer Paternal Grandfather    Heart disease Paternal Grandfather    Colon cancer Neg Hx    Esophageal cancer Neg Hx    Rectal cancer Neg Hx    Stomach cancer Neg Hx    Social History   Socioeconomic History   Marital status: Married    Spouse name: Not on file   Number of children: Not on file   Years of education: Not on file   Highest education level: Not on file  Occupational History   Not on file  Tobacco Use   Smoking status: Never   Smokeless tobacco: Never  Vaping Use   Vaping Use: Never used  Substance and Sexual Activity   Alcohol use: Not Currently   Drug use: Never   Sexual activity: Not Currently  Other Topics Concern   Not on file  Social History Narrative   Lives with husband is on disability, previously worked for [employer], no dietary restrictions.    Social Determinants of Health   Financial Resource Strain: Low Risk    Difficulty of Paying Living Expenses: Not hard at all  Food Insecurity: No Food Insecurity   Worried About Charity fundraiser in the Last Year: Never true   [place] in the Last Year: Never true  Transportation Needs: No Transportation Needs   Lack of Transportation (Medical): No   Lack of Transportation  (Non-Medical): No  Physical Activity: Sufficiently Active   Days of Exercise per Week: 7 days   Minutes of Exercise per Session: 30 min  Stress: No Stress Concern Present   Feeling of Stress : Only a little  Social Connections: Moderately Isolated   Frequency of Communication with Friends and Family: More than  three times a week   Frequency of Social Gatherings with Friends and Family: Once a week   Attends Religious Services: Never   Marine scientist or Organizations: No   Attends Music therapist: Never   Marital Status: Married    Tobacco Counseling Counseling given: Not Answered   Clinical Intake:  Pre-visit preparation completed: Yes  Pain : 0-10 Pain Score: 4  Pain Type: Chronic pain Pain Location: Abdomen Pain Onset: More than a month ago Pain Frequency: Constant     BMI - recorded: 21.13 Nutritional Status: BMI of 19-24  Normal Nutritional Risks: None Diabetes: No  How often do you need to have someone help you when you read instructions, pamphlets, or other written materials from your doctor or pharmacy?: 1 - Never  Diabetic?No  Interpreter Needed?: No  Information entered by :: Caroleen Hamman LPN   Activities of Daily Living In your present state of health, do you have any difficulty performing the following activities: 12/13/2021  Hearing? N  Vision? N  Difficulty concentrating or making decisions? N  Walking or climbing stairs? N  Dressing or bathing? N  Doing errands, shopping? N  Preparing Food and eating ? N  Using the Toilet? N  In the past six months, have you accidently leaked urine? Y  Do you have problems with loss of bowel control? N  Managing your Medications? N  Managing your Finances? N  Housekeeping or managing your Housekeeping? N  Some recent data might be hidden    Patient Care Team: Mosie Lukes, MD as PCP - General (Family Medicine) Jettie Booze, MD as PCP - Cardiology (Cardiology) Clydie Braun, MD (Obstetrics and Gynecology)  Indicate any recent Medical Services you may have received from other than [facility] providers in the past year (date may be approximate).     Assessment:   This is a routine wellness examination for [place].  Hearing/Vision screen Hearing Screening - Comments:: No issues Vision Screening - Comments:: Last eye exam-2 years ago  Dietary issues and exercise activities discussed: Current Exercise Habits: Home exercise routine, Type of exercise: walking, Time (Minutes): 30, Frequency (Times/Week): 7, Weekly Exercise (Minutes/Week): 210, Intensity: Mild, Exercise limited by: None identified   Goals Addressed             This Visit's Progress    Patient Stated       Drink more water       Depression Screen PHQ 2/9 Scores 12/13/2021 02/04/2021  PHQ - 2 Score 1 0    Fall Risk Fall Risk  12/13/2021  Falls in the past year? 0  Number falls in past yr: 0  Injury with Fall? 0  Follow up Falls prevention discussed    FALL RISK PREVENTION PERTAINING TO THE HOME:  Any stairs in or around the home? No  Home free of loose throw rugs in walkways, pet beds, electrical cords, etc? Yes  Adequate lighting in your home to reduce risk of falls? Yes   ASSISTIVE DEVICES UTILIZED TO PREVENT FALLS:  Life alert? No  Use of a cane, walker or w/c? No  Grab bars in the bathroom? No  Shower chair or bench in shower? No  Elevated toilet seat or a handicapped toilet? No   TIMED UP AND GO:  Was the test performed? No . Phone visit   Cognitive Function:Normal cognitive status assessed by this Nurse Health Advisor. No abnormalities found.  Immunizations  There is no immunization history on file for this patient.  TDAP status: Due, Education has been provided regarding the importance of this vaccine. Advised may receive this vaccine at local pharmacy or Health Dept. Aware to provide a copy of the vaccination record if obtained from local pharmacy  or Health Dept. Verbalized acceptance and understanding.  Flu Vaccine status: Declined, Education has been provided regarding the importance of this vaccine but patient still declined. Advised may receive this vaccine at local pharmacy or Health Dept. Aware to provide a copy of the vaccination record if obtained from local pharmacy or Health Dept. Verbalized acceptance and understanding.  Pneumococcal vaccine status: Due at age 58  Covid-19 vaccine status: Declined, Education has been provided regarding the importance of this vaccine but patient still declined. Advised may receive this vaccine at local pharmacy or Health Dept.or vaccine clinic. Aware to provide a copy of the vaccination record if obtained from local pharmacy or Health Dept. Verbalized acceptance and understanding.  Qualifies for Shingles Vaccine? Yes   Zostavax completed No   Shingrix Completed?: No.    Education has been provided regarding the importance of this vaccine. Patient has been advised to call insurance company to determine out of pocket expense if they have not yet received this vaccine. Advised may also receive vaccine at local pharmacy or Health Dept. Verbalized acceptance and understanding.  Screening Tests Health Maintenance  Topic Date Due   Hepatitis C Screening  02/04/2022 (Originally 03/20/1984)   HIV Screening  02/04/2022 (Originally [birth year]/20[birth year]1981)   COVID-19 Vaccine (1) 02/20/2022 (Originally 09/20/1966)   MAMMOGRAM  09/06/2023   COLONOSCOPY (Pts 45-64yrs Insurance coverage will need to be confirmed)  09/27/2023   HPV VACCINES  Aged Out   INFLUENZA VACCINE  Discontinued   PAP SMEAR-Modifier  Discontinued   TETANUS/TDAP  Discontinued   Zoster Vaccines- Shingrix  Discontinued    Health Maintenance  There are no preventive care reminders to display for this patient.  Colorectal cancer screening: Type of screening: Colonoscopy. Completed 09/26/2021. Repeat every 2 years  Mammogram status: Completed  bilateral 09/05/2021. Repeat every year  Bone Density status: Due at age 69  Lung Cancer Screening: (Low Dose CT Chest recommended if Age 24-80 years, 30 pack-year currently smoking OR have quit w/in 15years.) does not qualify.     Additional Screening:  Hepatitis C Screening: does not qualify  Vision Screening: Recommended annual ophthalmology exams for early detection of glaucoma and other disorders of the eye. Is the patient up to date with their annual eye exam?  No  Patient plans to make an appt soon.  Dental Screening: Recommended annual dental exams for proper oral hygiene  Community Resource Referral / Chronic Care Management: CRR required this visit?  No   CCM required this visit?  No      Plan:     I have personally reviewed and noted the following in the patients chart:   Medical and social history Use of alcohol, tobacco or illicit drugs  Current medications and supplements including opioid prescriptions. Patient is not currently taking opioid prescriptions. Functional ability and status Nutritional status Physical activity Advanced directives List of other physicians Hospitalizations, surgeries, and ER visits in previous 12 months Vitals Screenings to include cognitive, depression, and falls Referrals and appointments  In addition, I have reviewed and discussed with patient certain preventive protocols, quality metrics, and best practice recommendations. A written personalized care plan for preventive services as well as general preventive health recommendations were provided  to patient.   Due to this being a telephonic visit, the after visit summary with patients personalized plan was offered to patient via mail or my-chart. Patient would like to access on my-chart.   Marta Antu, LPN   0/07/3817  Nurse Health Advisor  Nurse Notes: None

## 2021-12-13 NOTE — Patient Instructions (Signed)
Diane Gutierrez , Thank you for taking time to complete your Medicare Wellness Visit. I appreciate your ongoing commitment to your health goals. Please review the following plan we discussed and let me know if I can assist you in the future.   Screening recommendations/referrals: Colonoscopy: Completed 09/26/2021-Due 09/27/2023 Mammogram: Completed 09/05/2021-Due 09/05/2022 Bone Density: Due at age 56 Recommended yearly ophthalmology/optometry visit for glaucoma screening and checkup Recommended yearly dental visit for hygiene and checkup  Vaccinations: Influenza vaccine: Declined Pneumococcal vaccine: Due at age 24 Tdap vaccine: Due-May obtain vaccine at your local pharmacy. Shingles vaccine: Due-May obtain vaccine at your local pharmacy. Covid-19: Declined  Advanced directives: Please bring a copy of Living Will and/or [place] for your chart once completed.   Conditions/risks identified: See problem list  Next appointment: Follow up in one year for your annual wellness visit.   Preventive Care 40-64 Years, Female Preventive care refers to lifestyle choices and visits with your health care provider that can promote health and wellness. What does preventive care include? A yearly physical exam. This is also called an annual well check. Dental exams once or twice a year. Routine eye exams. Ask your health care provider how often you should have your eyes checked. Personal lifestyle choices, including: Daily care of your teeth and gums. Regular physical activity. Eating a healthy diet. Avoiding tobacco and drug use. Limiting alcohol use. Practicing safe sex. Taking low-dose aspirin daily starting at age 43. Taking vitamin and mineral supplements as recommended by your health care provider. What happens during an annual well check? The services and screenings done by your health care provider during your annual well check will depend on your age, overall health,  lifestyle risk factors, and family history of disease. Counseling  Your health care provider may ask you questions about your: Alcohol use. Tobacco use. Drug use. Emotional well-being. Home and relationship well-being. Sexual activity. Eating habits. Work and work Statistician. Method of birth control. Menstrual cycle. Pregnancy history. Screening  You may have the following tests or measurements: Height, weight, and BMI. Blood pressure. Lipid and cholesterol levels. These may be checked every 5 years, or more frequently if you are over 1 years old. Skin check. Lung cancer screening. You may have this screening every year starting at age 23 if you have a 30-pack-year history of smoking and currently smoke or have quit within the past 15 years. Fecal occult blood test (FOBT) of the stool. You may have this test every year starting at age 26. Flexible sigmoidoscopy or colonoscopy. You may have a sigmoidoscopy every 5 years or a colonoscopy every 10 years starting at age 75. Hepatitis C blood test. Hepatitis B blood test. Sexually transmitted disease (STD) testing. Diabetes screening. This is done by checking your blood sugar (glucose) after you have not eaten for a while (fasting). You may have this done every 1-3 years. Mammogram. This may be done every 1-2 years. Talk to your health care provider about when you should start having regular mammograms. This may depend on whether you have a family history of breast cancer. BRCA-related cancer screening. This may be done if you have a family history of breast, ovarian, tubal, or peritoneal cancers. Pelvic exam and Pap test. This may be done every 3 years starting at age 7. Starting at age 74, this may be done every 5 years if you have a Pap test in combination with an HPV test. Bone density scan. This is done to screen for osteoporosis. You  may have this scan if you are at high risk for osteoporosis. Discuss your test results, treatment  options, and if necessary, the need for more tests with your health care provider. Vaccines  Your health care provider may recommend certain vaccines, such as: Influenza vaccine. This is recommended every year. Tetanus, diphtheria, and acellular pertussis (Tdap, Td) vaccine. You may need a Td booster every 10 years. Zoster vaccine. You may need this after age 58. Pneumococcal 13-valent conjugate (PCV13) vaccine. You may need this if you have certain conditions and were not previously vaccinated. Pneumococcal polysaccharide (PPSV23) vaccine. You may need one or two doses if you smoke cigarettes or if you have certain conditions. Talk to your health care provider about which screenings and vaccines you need and how often you need them. This information is not intended to replace advice given to you by your health care provider. Make sure you discuss any questions you have with your health care provider. Document Released: 11/23/2015 Document Revised: 07/16/2016 Document Reviewed: 08/28/2015 Elsevier Interactive Patient Education  2017 [place] Prevention in the Home Falls can cause injuries. They can happen to people of all ages. There are many things you can do to make your home safe and to help prevent falls. What can I do on the outside of my home? Regularly fix the edges of walkways and driveways and fix any cracks. Remove anything that might make you trip as you walk through a door, such as a raised step or threshold. Trim any bushes or trees on the path to your home. Use bright outdoor lighting. Clear any walking paths of anything that might make someone trip, such as rocks or tools. Regularly check to see if handrails are loose or broken. Make sure that both sides of any steps have handrails. Any raised decks and porches should have guardrails on the edges. Have any leaves, snow, or ice cleared regularly. Use sand or salt on walking paths during winter. Clean up any  spills in your garage right away. This includes oil or grease spills. What can I do in the bathroom? Use night lights. Install grab bars by the toilet and in the tub and shower. Do not use towel bars as grab bars. Use non-skid mats or decals in the tub or shower. If you need to sit down in the shower, use a plastic, non-slip stool. Keep the floor dry. Clean up any water that spills on the floor as soon as it happens. Remove soap buildup in the tub or shower regularly. Attach bath mats securely with double-sided non-slip rug tape. Do not have throw rugs and other things on the floor that can make you trip. What can I do in the bedroom? Use night lights. Make sure that you have a light by your bed that is easy to reach. Do not use any sheets or blankets that are too big for your bed. They should not hang down onto the floor. Have a firm chair that has side arms. You can use this for support while you get dressed. Do not have throw rugs and other things on the floor that can make you trip. What can I do in the kitchen? Clean up any spills right away. Avoid walking on wet floors. Keep items that you use a lot in easy-to-reach places. If you need to reach something above you, use a strong step stool that has a grab bar. Keep electrical cords out of the way. Do not use floor  polish or wax that makes floors slippery. If you must use wax, use non-skid floor wax. Do not have throw rugs and other things on the floor that can make you trip. What can I do with my stairs? Do not leave any items on the stairs. Make sure that there are handrails on both sides of the stairs and use them. Fix handrails that are broken or loose. Make sure that handrails are as long as the stairways. Check any carpeting to make sure that it is firmly attached to the stairs. Fix any carpet that is loose or worn. Avoid having throw rugs at the top or bottom of the stairs. If you do have throw rugs, attach them to the floor  with carpet tape. Make sure that you have a light switch at the top of the stairs and the bottom of the stairs. If you do not have them, ask someone to add them for you. What else can I do to help prevent falls? Wear shoes that: Do not have high heels. Have rubber bottoms. Are comfortable and fit you well. Are closed at the toe. Do not wear sandals. If you use a stepladder: Make sure that it is fully opened. Do not climb a closed stepladder. Make sure that both sides of the stepladder are locked into place. Ask someone to hold it for you, if possible. Clearly mark and make sure that you can see: Any grab bars or handrails. First and last steps. Where the edge of each step is. Use tools that help you move around (mobility aids) if they are needed. These include: Canes. Walkers. Scooters. Crutches. Turn on the lights when you go into a dark area. Replace any light bulbs as soon as they burn out. Set up your furniture so you have a clear path. Avoid moving your furniture around. If any of your floors are uneven, fix them. If there are any pets around you, be aware of where they are. Review your medicines with your doctor. Some medicines can make you feel dizzy. This can increase your chance of falling. Ask your doctor what other things that you can do to help prevent falls. This information is not intended to replace advice given to you by your health care provider. Make sure you discuss any questions you have with your health care provider. Document Released: 08/23/2009 Document Revised: 04/03/2016 Document Reviewed: 12/01/2014 Elsevier Interactive Patient Education  2017 [employer].

## 2021-12-13 NOTE — Telephone Encounter (Signed)
Patient states she saw Dr. Charlett Blake in Sept & was suppose to follow up in 6 months & states she was told someone would call her to schedule but she never received a call. I attempted to make her an appt today but Dr. Frederik Pear first available appt is in June & the patient states she needs to be seen before then & does not want to see anyone else.

## 2021-12-16 NOTE — Telephone Encounter (Signed)
Called to schedule

## 2021-12-18 DIAGNOSIS — Z9049 Acquired absence of other specified parts of digestive tract: Secondary | ICD-10-CM | POA: Diagnosis not present

## 2021-12-18 DIAGNOSIS — R1030 Lower abdominal pain, unspecified: Secondary | ICD-10-CM | POA: Diagnosis not present

## 2021-12-18 DIAGNOSIS — K5730 Diverticulosis of large intestine without perforation or abscess without bleeding: Secondary | ICD-10-CM | POA: Diagnosis not present

## 2021-12-18 DIAGNOSIS — Q438 Other specified congenital malformations of intestine: Secondary | ICD-10-CM | POA: Diagnosis not present

## 2021-12-18 DIAGNOSIS — K5790 Diverticulosis of intestine, part unspecified, without perforation or abscess without bleeding: Secondary | ICD-10-CM | POA: Diagnosis not present

## 2021-12-18 NOTE — Telephone Encounter (Signed)
Not sure if you were asked about this.  Are you ok with Thursday 02/13/22 at either 1pm or 4pm.

## 2021-12-19 NOTE — Addendum Note (Signed)
Addended by: Kem Boroughs D on: 12/19/2021 11:43 AM   Modules accepted: Orders

## 2021-12-19 NOTE — Telephone Encounter (Signed)
Patient scheduled.  She wanted to come a week before to get labs done before appointment.  Lab orders placed, but patient stated she will call back to schedule.

## 2021-12-27 DIAGNOSIS — Q438 Other specified congenital malformations of intestine: Secondary | ICD-10-CM | POA: Diagnosis not present

## 2021-12-27 DIAGNOSIS — K2100 Gastro-esophageal reflux disease with esophagitis, without bleeding: Secondary | ICD-10-CM | POA: Diagnosis not present

## 2021-12-27 DIAGNOSIS — R1030 Lower abdominal pain, unspecified: Secondary | ICD-10-CM | POA: Diagnosis not present

## 2021-12-27 DIAGNOSIS — D369 Benign neoplasm, unspecified site: Secondary | ICD-10-CM | POA: Diagnosis not present

## 2021-12-27 DIAGNOSIS — K222 Esophageal obstruction: Secondary | ICD-10-CM | POA: Diagnosis not present

## 2022-01-09 DIAGNOSIS — R519 Headache, unspecified: Secondary | ICD-10-CM | POA: Diagnosis not present

## 2022-01-14 DIAGNOSIS — N6341 Unspecified lump in right breast, subareolar: Secondary | ICD-10-CM | POA: Diagnosis not present

## 2022-01-20 ENCOUNTER — Ambulatory Visit: Payer: Medicare Other | Admitting: Obstetrics and Gynecology

## 2022-01-20 DIAGNOSIS — N63 Unspecified lump in unspecified breast: Secondary | ICD-10-CM | POA: Diagnosis not present

## 2022-01-20 DIAGNOSIS — Z9889 Other specified postprocedural states: Secondary | ICD-10-CM | POA: Diagnosis not present

## 2022-01-20 DIAGNOSIS — N6452 Nipple discharge: Secondary | ICD-10-CM | POA: Diagnosis not present

## 2022-01-27 DIAGNOSIS — H9201 Otalgia, right ear: Secondary | ICD-10-CM | POA: Diagnosis not present

## 2022-01-27 DIAGNOSIS — R519 Headache, unspecified: Secondary | ICD-10-CM | POA: Diagnosis not present

## 2022-02-06 DIAGNOSIS — B3731 Acute candidiasis of vulva and vagina: Secondary | ICD-10-CM | POA: Diagnosis not present

## 2022-02-11 ENCOUNTER — Encounter (HOSPITAL_BASED_OUTPATIENT_CLINIC_OR_DEPARTMENT_OTHER): Payer: Self-pay | Admitting: *Deleted

## 2022-02-11 ENCOUNTER — Other Ambulatory Visit: Payer: Self-pay

## 2022-02-11 ENCOUNTER — Emergency Department (HOSPITAL_BASED_OUTPATIENT_CLINIC_OR_DEPARTMENT_OTHER)
Admission: EM | Admit: 2022-02-11 | Discharge: 2022-02-11 | Disposition: A | Discharge status: Home / Self Care | Payer: Medicare Other | Attending: Emergency Medicine | Admitting: Emergency Medicine

## 2022-02-11 ENCOUNTER — Emergency Department (HOSPITAL_BASED_OUTPATIENT_CLINIC_OR_DEPARTMENT_OTHER): Payer: Medicare Other

## 2022-02-11 DIAGNOSIS — R300 Dysuria: Secondary | ICD-10-CM | POA: Insufficient documentation

## 2022-02-11 DIAGNOSIS — R1030 Lower abdominal pain, unspecified: Secondary | ICD-10-CM

## 2022-02-11 DIAGNOSIS — R110 Nausea: Secondary | ICD-10-CM | POA: Insufficient documentation

## 2022-02-11 DIAGNOSIS — Z79899 Other long term (current) drug therapy: Secondary | ICD-10-CM | POA: Insufficient documentation

## 2022-02-11 DIAGNOSIS — R1032 Left lower quadrant pain: Secondary | ICD-10-CM | POA: Diagnosis not present

## 2022-02-11 DIAGNOSIS — R319 Hematuria, unspecified: Secondary | ICD-10-CM | POA: Diagnosis not present

## 2022-02-11 LAB — COMPREHENSIVE METABOLIC PANEL
ALT: 16 U/L (ref 0–44)
AST: 23 U/L (ref 15–41)
Albumin: 4.5 g/dL (ref 3.5–5.0)
Alkaline Phosphatase: 63 U/L (ref 38–126)
Anion gap: 8 (ref 5–15)
BUN: 9 mg/dL (ref 6–20)
CO2: 26 mmol/L (ref 22–32)
Calcium: 9.7 mg/dL (ref 8.9–10.3)
Chloride: 104 mmol/L (ref 98–111)
Creatinine, Ser: 0.89 mg/dL (ref 0.44–1.00)
GFR, Estimated: >60 mL/min (ref >60)
Glucose, Bld: 97 mg/dL (ref 70–99)
Potassium: 4.9 mmol/L (ref 3.5–5.1)
Sodium: 138 mmol/L (ref 135–145)
Total Bilirubin: 0.8 mg/dL (ref 0.3–1.2)
Total Protein: 7.8 g/dL (ref 6.5–8.1)

## 2022-02-11 LAB — URINALYSIS, ROUTINE W REFLEX MICROSCOPIC
Bilirubin Urine: NEGATIVE
Glucose, UA: NEGATIVE mg/dL
Hgb urine dipstick: NEGATIVE
Ketones, ur: NEGATIVE mg/dL
Leukocytes,Ua: NEGATIVE
Nitrite: NEGATIVE
Protein, ur: NEGATIVE mg/dL
Specific Gravity, Urine: 1.01 (ref 1.005–1.030)
pH: 7 (ref 5.0–8.0)

## 2022-02-11 LAB — CBC
HCT: 43.4 % (ref 36.0–46.0)
Hemoglobin: 14.6 g/dL (ref 12.0–15.0)
MCH: 30.2 pg (ref 26.0–34.0)
MCHC: 33.6 g/dL (ref 30.0–36.0)
MCV: 89.9 fL (ref 80.0–100.0)
Platelets: 251 10*3/uL (ref 150–400)
RBC: 4.83 MIL/uL (ref 3.87–5.11)
RDW: 12.2 % (ref 11.5–15.5)
WBC: 7 10*3/uL (ref 4.0–10.5)
nRBC: 0 % (ref 0.0–0.2)

## 2022-02-11 LAB — LIPASE, BLOOD: Lipase: 36 U/L (ref 11–51)

## 2022-02-11 MED ORDER — DICYCLOMINE HCL 20 MG PO TABS: 20.0000 mg | ORAL_TABLET | Freq: Two times a day (BID) | ORAL | 0 refills | Status: DC | PRN

## 2022-02-11 MED ORDER — MORPHINE SULFATE (PF) 2 MG/ML IV SOLN
2.0000 mg | Freq: Once | INTRAVENOUS | Status: AC
  Administered 2022-02-11: 2 mg via INTRAVENOUS
  Filled 2022-02-11: qty 1

## 2022-02-11 MED ORDER — IOHEXOL 300 MG/ML  SOLN
100.0000 mL | Freq: Once | INTRAMUSCULAR | Status: AC | PRN
  Administered 2022-02-11: 100 mL via INTRAVENOUS

## 2022-02-11 MED ORDER — ONDANSETRON HCL 4 MG/2ML IJ SOLN
4.0000 mg | Freq: Once | INTRAMUSCULAR | Status: AC
  Administered 2022-02-11: 4 mg via INTRAVENOUS
  Filled 2022-02-11: qty 2

## 2022-02-11 NOTE — ED Provider Notes (Signed)
?[name] ?Provider Note ? ? ?CSN: [id number] ?Arrival date & time: 02/11/22  1541 ? ?  ? ?History ? ?Chief Complaint  ?Patient presents with  ? Abdominal Pain  ? ? ?Diane Gutierrez is a 56 y.o. female with a past medical history significant for previous total hysterectomy, cholecystectomy, IBS who presents with concern for lower abdominal pain, frequency, urgency, and burning with urination, as well as some suprapubic pain, pressure.  She also endorses some left lower quadrant left flank pain.  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  She denies any vaginal discharge, vaginal bleeding, dyspareunia.  She reports that she was seen by her OB/GYN a few days ago, provided Diflucan for yeast infection.  She denies improvement of symptoms.  She denies fever, chills, nausea, vomiting.  He also endorses some hematuria.  She denies previous history of kidney stones. ? ? ?Abdominal Pain ?Associated symptoms: dysuria and hematuria   ? ?  ? ?Home Medications ?Prior to Admission medications   ?Medication Sig Start Date End Date Taking? Authorizing Provider  ?dicyclomine (BENTYL) 20 MG tablet Take 1 tablet (20 mg total) by mouth 2 (two) times daily as needed for spasms. 02/11/22  Yes Micharl Helmes H, PA-C  ?acetaminophen (TYLENOL) 500 MG tablet Take 500 mg by mouth every 6 (six) hours as needed for mild pain or headache.    [provider]  ?Ascorbic Acid (VITAMIN C) 100 MG tablet Take 100 mg by mouth daily.    [provider]  ?BIOTIN PO Take 1 tablet by mouth daily.    [provider]  ?Cholecalciferol (VITAMIN D3) 1.25 MG (50000 UT) TABS Take 1 tablet by mouth every other day.    [provider]  ?ELDERBERRY PO Take 1 tablet by mouth daily.     [provider]  ?Astrid Drafts 1000 MG CAPS Take 350 mg by mouth.     [provider]  ?metoprolol succinate (TOPROL-XL) 25 MG 24 hr tablet Take 1 tablet (25 mg total) by mouth daily. With or immediately following a meal. 10/16/21   Jettie Booze, MD  ?Multiple Vitamin (MULTIVITAMIN) tablet Take 1 tablet by mouth daily.    [provider]  ?nitroGLYCERIN (NITROSTAT) 0.4 MG SL tablet Place 1 tablet (0.4 mg total) under the tongue every 5 (five) minutes as needed for chest pain. 08/08/21   Mosie Lukes, MD  ?omeprazole (PRILOSEC) 20 MG capsule TAKE ONE CAPSULE BY MOUTH ONE TIME DAILY **MUST CALL DR. FOR APPOINTMENT** 10/15/21   Mosie Lukes, MD  ?promethazine (PHENERGAN) 12.5 MG tablet Take 1 tablet (12.5 mg total) by mouth every 6 (six) hours as needed for nausea or vomiting. 08/08/21   Mosie Lukes, MD  ?metoprolol tartrate (LOPRESSOR) 50 MG tablet Take 1 tablet by mouth 2 hours prior to Cardiac CT 04/13/20 05/01/20  Jettie Booze, MD  ?   ? ?Allergies    ?Prednisone and Ibuprofen   ? ?Review of Systems   ?Review of Systems  ?Gastrointestinal:  Positive for abdominal pain.  ?Genitourinary:  Positive for dysuria, frequency and hematuria.  ?All other systems reviewed and are negative. ? ?Physical Exam ?Updated Vital Signs ?BP (!) 134/92   Pulse 64   Temp 98.2 ?F (36.8 ?C) (Oral)   Resp 18  Ht '5\' 4"'$  (1.626 m)   Wt 56.2 kg   SpO2 99%   BMI 21.28 kg/m?  ?Physical Exam ?Vitals and nursing note reviewed.  ?Constitutional:   ?   General: She is not in acute distress. ?   Appearance: Normal appearance.  ?HENT:  ?   Head: Normocephalic and atraumatic.  ?Eyes:  ?   General:     ?   Right eye: No discharge.     ?   Left eye: No discharge.  ?Cardiovascular:  ?   Rate and Rhythm: Normal rate and regular rhythm.  ?   Heart sounds: No murmur heard. ?  No friction rub. No gallop.  ?Pulmonary:  ?   Effort: Pulmonary effort is normal.  ?   Breath sounds: Normal breath sounds.   ?Abdominal:  ?   General: Bowel sounds are normal.  ?   Palpations: Abdomen is soft.  ?   Comments: Most focally tender in the left lower quadrant, left flank, As well as suprapubic region.  No rebound, rigidity, guarding noted on exam, however patient verbally rates pain as 10/10 with palpation of the left lower quadrant.  No abdominal distention, ecchymosis noted.  Normal bowel sounds throughout.  ?Skin: ?   General: Skin is warm and dry.  ?   Capillary Refill: Capillary refill takes less than 2 seconds.  ?Neurological:  ?   Mental Status: She is alert and oriented to person, place, and time.  ?Psychiatric:     ?   Mood and Affect: Mood normal.     ?   Behavior: Behavior normal.  ? ? ?ED Results / Procedures / Treatments   ?Labs ?(all labs ordered are listed, but only abnormal results are displayed) ?Labs Reviewed  ?LIPASE, BLOOD  ?COMPREHENSIVE METABOLIC PANEL  ?CBC  ?URINALYSIS, ROUTINE W REFLEX MICROSCOPIC  ? ? ?EKG ?None ? ?Radiology ?CT ABDOMEN PELVIS W CONTRAST ? ?Result Date: 02/11/2022 ?CLINICAL DATA:  LEFT lower quadrant abdominal pain. EXAM: CT ABDOMEN AND PELVIS WITH CONTRAST TECHNIQUE: Multidetector CT imaging of the abdomen and pelvis was performed using the standard protocol following bolus administration of intravenous contrast. RADIATION DOSE REDUCTION: This exam was performed according to the departmental dose-optimization program which includes automated exposure control, adjustment of the mA and/or kV according to patient size and/or use of iterative reconstruction technique. CONTRAST:  172m OMNIPAQUE IOHEXOL 300 MG/ML  SOLN COMPARISON:  CT 12/10/2020 FINDINGS: Lower chest: Lung bases are clear. Hepatobiliary: No focal hepatic lesion. Postcholecystectomy. No biliary dilatation. Pancreas: Pancreas is normal. No ductal dilatation. No pancreatic inflammation. Spleen: Normal spleen Adrenals/urinary tract: Adrenal glands and kidneys are normal. The ureters and bladder normal. Stomach/Bowel: Small  hiatal hernia. Stomach, duodenum small-bowel normal. The cecum is in the LEFT lower quadrant (terminal ileum seen on coronal image 53/5). Appendix not identified. No secondary signs of appendicitis. The colon and rectosigmoid colon are normal. No small bowel malrotation. Vascular/Lymphatic: Abdominal aorta is normal caliber. No periportal or retroperitoneal adenopathy. No pelvic adenopathy. Reproductive: Post hysterectomy.  Adnexa unremarkable Other: No free fluid. Musculoskeletal: No aggressive osseous lesion. IMPRESSION: 1. No acute findings in the abdomen pelvis. 2. Unusual location of the cecum in the LEFT lower quadrant which has migrated from comparison exam. No bowel obstruction or inflammation identified. 3. Postcholecystectomy hysterectomy. Electronically Signed   By: SSuzy BouchardM.D.   On: 02/11/2022 17:26   ? ?Procedures ?Procedures  ? ? ?Medications Ordered in ED ?Medications  ?morphine (PF) 2 MG/ML injection 2 mg (2 mg  Intravenous Given 02/11/22 1630)  ?ondansetron [facility]) injection 4 mg (4 mg Intravenous Given 02/11/22 1628)  ?iohexol (OMNIPAQUE) 300 MG/ML solution 100 mL (100 mLs Intravenous Contrast Given 02/11/22 1706)  ? ? ?ED Course/ Medical Decision Making/ A&P ?  ?                        ?Medical Decision Making ?Amount and/or Complexity of Data Reviewed ?Labs: ordered. ? ? ?This patient presents to the ED for concern of dysuria, isolated episode of hematuria, lower abdominal pain, abdominal discomfort,, this involves an extensive number of treatment options, and is a complaint that carries with it a high risk of complications and morbidity. The emergent differential diagnosis prior to evaluation includes, but is not limited to, UTI, pyelonephritis, nephrolithiasis, diverticulitis, appendicitis, no clinical concern for ovarian torsion as patient has had total hysterectomy, additionally some concern for bowel obstruction or other colon abnormality as patient reports that she has had some surgical  complications, adhesions around her colon with abnormal positioning, additionally considered mesenteric ischemia, or other acute or surgical intra-abdominal abnormalities.  ? ?This is not an exhaustive differential.  ? ?Pas

## 2022-02-11 NOTE — Discharge Instructions (Signed)
As we discussed I do not see any clinical cause for your symptoms today.  I have some suspicion that this pain may be secondary to the yeast infection that you had, that you possibly passed a kidney stone already and are still having some lingering pain, or that it may be due to the colon changes which you have already identified and discussed with your gastroenterologist.  I recommend that you follow-up with urology if you continue to have burning with urination despite treatment with your second tablet of Diflucan.  Additionally am prescribing a medication called Bentyl which can help with some crampy abdominal pain.  You can use the Zofran or other nausea medication that you reported at home as needed for nausea.  You can use Tylenol up to 1000 mg every 6 hours for pain control, maximum 4000 mg daily.  If you have concern for worsening pain, fever, chills please return to the emergency department for further evaluation. ?

## 2022-02-11 NOTE — ED Triage Notes (Addendum)
Pt seen by OBGYN this week for lower abd pain and burning. She was treated for yeast infection. States she has frequency urgency and burning. Reports pain in LLQ, left flank, and suprapubic. States Saturday she had one episode of pressure and hematuria.States she feels weak and is nauseated ?

## 2022-02-13 ENCOUNTER — Ambulatory Visit: Payer: Medicare Other | Admitting: Family Medicine

## 2022-02-20 DIAGNOSIS — Z86010 Personal history of colonic polyps: Secondary | ICD-10-CM | POA: Diagnosis not present

## 2022-02-20 DIAGNOSIS — R1084 Generalized abdominal pain: Secondary | ICD-10-CM | POA: Diagnosis not present

## 2022-02-20 DIAGNOSIS — Z800 Family history of malignant neoplasm of digestive organs: Secondary | ICD-10-CM | POA: Diagnosis not present

## 2022-02-20 DIAGNOSIS — R1310 Dysphagia, unspecified: Secondary | ICD-10-CM | POA: Diagnosis not present

## 2022-02-20 DIAGNOSIS — K582 Mixed irritable bowel syndrome: Secondary | ICD-10-CM | POA: Diagnosis not present

## 2022-03-07 DIAGNOSIS — R1310 Dysphagia, unspecified: Secondary | ICD-10-CM | POA: Diagnosis not present

## 2022-03-07 DIAGNOSIS — K2289 Other specified disease of esophagus: Secondary | ICD-10-CM | POA: Diagnosis not present

## 2022-03-07 DIAGNOSIS — K582 Mixed irritable bowel syndrome: Secondary | ICD-10-CM | POA: Diagnosis not present

## 2022-03-07 DIAGNOSIS — Z9049 Acquired absence of other specified parts of digestive tract: Secondary | ICD-10-CM | POA: Diagnosis not present

## 2022-04-03 ENCOUNTER — Telehealth: Payer: Self-pay | Admitting: Family Medicine

## 2022-04-03 DIAGNOSIS — Q438 Other specified congenital malformations of intestine: Secondary | ICD-10-CM

## 2022-04-03 DIAGNOSIS — K581 Irritable bowel syndrome with constipation: Secondary | ICD-10-CM

## 2022-04-03 NOTE — Telephone Encounter (Signed)
Requesting referral [facility]. Pt will get back to [place] with additional information.

## 2022-04-03 NOTE — Telephone Encounter (Signed)
Patient states she would like a call back to discuss a referral for a colonoscopy and to discuss about paperwork she night need. Please advise.

## 2022-04-04 NOTE — Telephone Encounter (Signed)
Pt called to discuss acquired info. Pt would like a call back whenever possible.

## 2022-04-04 NOTE — Telephone Encounter (Signed)
Pt requested referral sent to Tillie Rung at [facility]. Pt states she cannot understand Dr. Delle Reining and would like to see someone at [facility] instead.

## 2022-04-16 ENCOUNTER — Encounter: Payer: Self-pay | Admitting: Obstetrics and Gynecology

## 2022-04-16 ENCOUNTER — Ambulatory Visit (INDEPENDENT_AMBULATORY_CARE_PROVIDER_SITE_OTHER): Payer: Medicare Other | Admitting: Obstetrics and Gynecology

## 2022-04-16 VITALS — BP 123/86 | HR 82

## 2022-04-16 DIAGNOSIS — N8110 Cystocele, unspecified: Secondary | ICD-10-CM

## 2022-04-16 DIAGNOSIS — N3281 Overactive bladder: Secondary | ICD-10-CM

## 2022-04-16 DIAGNOSIS — M62838 Other muscle spasm: Secondary | ICD-10-CM | POA: Diagnosis not present

## 2022-04-16 DIAGNOSIS — N952 Postmenopausal atrophic vaginitis: Secondary | ICD-10-CM

## 2022-04-16 DIAGNOSIS — R350 Frequency of micturition: Secondary | ICD-10-CM

## 2022-04-16 LAB — POCT URINALYSIS DIPSTICK
Appearance: NORMAL
Bilirubin, UA: NEGATIVE
Blood, UA: NEGATIVE
Glucose, UA: NEGATIVE
Ketones, UA: NEGATIVE
Leukocytes, UA: NEGATIVE
Nitrite, UA: NEGATIVE
Protein, UA: NEGATIVE
Spec Grav, UA: 1.01 (ref 1.010–1.025)
Urobilinogen, UA: 0.2 E.U./dL
pH, UA: 7 (ref 5.0–8.0)

## 2022-04-16 NOTE — Patient Instructions (Signed)
Vulvovaginal moisturizer Options: Vitamin E oil (pump or capsule) or cream (Gene's Vit E Cream) Coconut oil Silicone-based lubricant for use during intercourse ("wet platinum" is a brand available at most drugstores) Crisco Consider the ingredients of the product - the fewer the ingredients the better!  Directions for Use: Clean and dry your hands Gently dab the vulvar/vaginal area dry as needed Apply a "pea-sized" amount of the moisturizer onto your fingertip Using you other hand, open the labia  Apply the moisturizer to the vulvar/vaginal tissues Wear loose fitting underwear/clothing if possible following application Use moisturize up to 3 times daily as desired.  

## 2022-04-16 NOTE — Progress Notes (Signed)
[facility] Return Visit  SUBJECTIVE  History of Present Illness: Diane Gutierrez is a 56 y.o. female seen in follow-up for prolapse. Plan at last visit was to start physical therapy.   Did not have a chance to get to physical therapy. Has been using coconut oil for vaginal moisture but still has some vaginal burning occasionally. Continues to have bladder urgency.   Was having some abdominal pain and saw some blood in the urine and feels that she passed a kidney stone. This occurred in April. She had a CT abdomen and pelvis which did not show any stones but did show that the cecum had moved to the LLQ. She is following up with a specialist.   Past Medical History: Patient  has a past medical history of Abdominal pain, Acid reflux, Anxiety, Colicky RLQ abdominal pain (07/07/2019), Fibrocystic breast (07/10/2019), Hiatal hernia, Hyperlipidemia, Leukopenia, Low back pain, Lung nodule, Neck pain, Palpitations, Seasonal allergies, Seizure ([place]), Sneezing, SOB (shortness of breath), Tachycardia, Tachycardia (94/17/4081), Umbilical hernia, and Weight gain.   Past Surgical History: She  has a past surgical history that includes Breast surgery; Cholecystectomy; Joint replacement (Left); Neck surgery; Hysteroscopy diagnostic; Breast biopsy (Left); Robotic assisted total hysterectomy; and Knee surgery.   Medications: She has a current medication list which includes the following prescription(s): acetaminophen, vitamin c, biotin, vitamin d3, dicyclomine, elderberry, krill oil, metoprolol succinate, multivitamin, nitroglycerin, omeprazole, promethazine, and [DISCONTINUED] metoprolol tartrate.   Allergies: Patient is allergic to prednisone and ibuprofen.   Social History: Patient  reports that she has never smoked. She has never used smokeless tobacco. She reports that she does not currently use alcohol. She reports that she does not use drugs.      OBJECTIVE     Physical  Exam: Vitals:   04/16/22 1203  BP: 123/86  Pulse: 82   Gen: No apparent distress, A&O x 3.  Detailed Urogynecologic Evaluation:  Normal external genitalia. Speculum exam revealed atrophy with no lesions. No masses on bimanual. Tenderness in pelvic floor muscles bilaterally.   POP-Q  1                                            Aa   1                                           Ba  -4                                              C   4.5                                            Gh  3                                            Pb  6  tvl   -2                                            Ap  -2                                            Bp                                                 D      ASSESSMENT AND PLAN    Diane Gutierrez is a 56 y.o. with:  1. Prolapse of anterior vaginal wall   2. Urinary frequency   3. Levator spasm   4. Overactive bladder    Prolapse - not significantly changed from prior exam. We reviewed options of pelvic PT, pessary or surgery.  - She would like to try pelvic PT again, referral placed. May be interested in pessary in the future.   2. OAB - referral placed to PT  3. Levator spasm - we discussed this may be contributing to some of her pelvic discomfort and that physical therapy can give her exercises to help relax muscles.   4. Vaginal atrophy - discussed use of vaginal estrogen but she declines at this time and will continue with coconut oil.  Follow up 3 months or sooner if needed  Jaquita Folds, MD  Time spent: I spent 20 minutes dedicated to the care of this patient on the date of this encounter to include pre-visit review of records, face-to-face time with the patient and post visit documentation

## 2022-07-01 DIAGNOSIS — N8110 Cystocele, unspecified: Secondary | ICD-10-CM | POA: Diagnosis not present

## 2022-07-01 DIAGNOSIS — M62838 Other muscle spasm: Secondary | ICD-10-CM | POA: Diagnosis not present

## 2022-07-25 ENCOUNTER — Ambulatory Visit: Payer: Medicare Other | Admitting: Obstetrics and Gynecology

## 2022-07-30 ENCOUNTER — Encounter (HOSPITAL_BASED_OUTPATIENT_CLINIC_OR_DEPARTMENT_OTHER): Payer: Self-pay | Admitting: Emergency Medicine

## 2022-07-30 ENCOUNTER — Other Ambulatory Visit: Payer: Self-pay

## 2022-07-30 ENCOUNTER — Emergency Department (HOSPITAL_BASED_OUTPATIENT_CLINIC_OR_DEPARTMENT_OTHER): Payer: Medicare Other

## 2022-07-30 ENCOUNTER — Emergency Department (HOSPITAL_BASED_OUTPATIENT_CLINIC_OR_DEPARTMENT_OTHER)
Admission: EM | Admit: 2022-07-30 | Discharge: 2022-07-30 | Disposition: A | Discharge status: Home / Self Care | Payer: Medicare Other | Attending: Emergency Medicine | Admitting: Emergency Medicine

## 2022-07-30 DIAGNOSIS — R197 Diarrhea, unspecified: Secondary | ICD-10-CM

## 2022-07-30 DIAGNOSIS — Z85038 Personal history of other malignant neoplasm of large intestine: Secondary | ICD-10-CM | POA: Insufficient documentation

## 2022-07-30 DIAGNOSIS — K921 Melena: Secondary | ICD-10-CM | POA: Diagnosis not present

## 2022-07-30 DIAGNOSIS — R1084 Generalized abdominal pain: Secondary | ICD-10-CM

## 2022-07-30 DIAGNOSIS — I700 Atherosclerosis of aorta: Secondary | ICD-10-CM | POA: Diagnosis not present

## 2022-07-30 DIAGNOSIS — R109 Unspecified abdominal pain: Secondary | ICD-10-CM | POA: Diagnosis not present

## 2022-07-30 LAB — URINALYSIS, ROUTINE W REFLEX MICROSCOPIC
Bilirubin Urine: NEGATIVE
Glucose, UA: NEGATIVE mg/dL
Hgb urine dipstick: NEGATIVE
Ketones, ur: NEGATIVE mg/dL
Nitrite: NEGATIVE
Protein, ur: NEGATIVE mg/dL
Specific Gravity, Urine: 1.02 (ref 1.005–1.030)
pH: 6 (ref 5.0–8.0)

## 2022-07-30 LAB — COMPREHENSIVE METABOLIC PANEL
ALT: 16 U/L (ref 0–44)
AST: 22 U/L (ref 15–41)
Albumin: 3.9 g/dL (ref 3.5–5.0)
Alkaline Phosphatase: 60 U/L (ref 38–126)
Anion gap: 7 (ref 5–15)
BUN: 18 mg/dL (ref 6–20)
CO2: 23 mmol/L (ref 22–32)
Calcium: 8.9 mg/dL (ref 8.9–10.3)
Chloride: 108 mmol/L (ref 98–111)
Creatinine, Ser: 0.71 mg/dL (ref 0.44–1.00)
GFR, Estimated: >60 mL/min (ref >60)
Glucose, Bld: 120 mg/dL — ABNORMAL HIGH (ref 70–99)
Potassium: 4 mmol/L (ref 3.5–5.1)
Sodium: 138 mmol/L (ref 135–145)
Total Bilirubin: 0.5 mg/dL (ref 0.3–1.2)
Total Protein: 6.7 g/dL (ref 6.5–8.1)

## 2022-07-30 LAB — PREGNANCY, URINE: Preg Test, Ur: NEGATIVE

## 2022-07-30 LAB — CBC
HCT: 38.7 % (ref 36.0–46.0)
Hemoglobin: 13.4 g/dL (ref 12.0–15.0)
MCH: 30.7 pg (ref 26.0–34.0)
MCHC: 34.6 g/dL (ref 30.0–36.0)
MCV: 88.8 fL (ref 80.0–100.0)
Platelets: 232 10*3/uL (ref 150–400)
RBC: 4.36 MIL/uL (ref 3.87–5.11)
RDW: 12 % (ref 11.5–15.5)
WBC: 9.8 10*3/uL (ref 4.0–10.5)
nRBC: 0 % (ref 0.0–0.2)

## 2022-07-30 LAB — URINALYSIS, MICROSCOPIC (REFLEX): RBC / HPF: NONE SEEN RBC/hpf (ref 0–5)

## 2022-07-30 LAB — LIPASE, BLOOD: Lipase: 37 U/L (ref 11–51)

## 2022-07-30 MED ORDER — LACTATED RINGERS IV BOLUS
1000.0000 mL | Freq: Once | INTRAVENOUS | Status: AC
  Administered 2022-07-30: 1000 mL via INTRAVENOUS

## 2022-07-30 MED ORDER — MORPHINE SULFATE (PF) 4 MG/ML IV SOLN
4.0000 mg | Freq: Once | INTRAVENOUS | Status: AC
  Administered 2022-07-30: 4 mg via INTRAVENOUS
  Filled 2022-07-30: qty 1

## 2022-07-30 MED ORDER — IOHEXOL 300 MG/ML  SOLN
100.0000 mL | Freq: Once | INTRAMUSCULAR | Status: AC | PRN
  Administered 2022-07-30: 100 mL via INTRAVENOUS

## 2022-07-30 MED ORDER — FAMOTIDINE IN NACL 20-0.9 MG/50ML-% IV SOLN
20.0000 mg | Freq: Once | INTRAVENOUS | Status: AC
  Administered 2022-07-30: 20 mg via INTRAVENOUS
  Filled 2022-07-30: qty 50

## 2022-07-30 MED ORDER — ONDANSETRON HCL 4 MG/2ML IJ SOLN
4.0000 mg | Freq: Once | INTRAMUSCULAR | Status: AC
  Administered 2022-07-30: 4 mg via INTRAVENOUS
  Filled 2022-07-30: qty 2

## 2022-07-30 NOTE — ED Triage Notes (Signed)
Pt reports ongoing digestive issues that worsened Friday. C/o abdominal pain, nausea, diarrhea and bloody stools.

## 2022-07-30 NOTE — ED Notes (Signed)
Patient transported to CT 

## 2022-07-30 NOTE — ED Provider Notes (Signed)
[facility] EMERGENCY DEPARTMENT Provider Note   CSN: [id number] Arrival date & time: 07/30/22  2058     History Chief Complaint  Patient presents with   Abdominal Pain    HPI Diane Gutierrez is a 56 y.o. female presenting for abdominal pain over the past 16 hours.  She endorses a history of similar seen in April referred to gastroenterology and has her gastroenterology appointment next month.  She states that today she had cramping abdominal pain accompanied by bloody diarrhea.  Her pain is now improving.  She denies fevers or chills nausea vomiting syncope shortness of breath.  She is otherwise ambulatory tolerating p.o. intake at this time.  Picture of bright red blood in the toilet.  Significant family history of colon cancer.  Had a colonoscopy last year with a large polyp.   Patient's recorded medical, surgical, social, medication list and allergies were reviewed in the Snapshot window as part of the initial history.   Review of Systems   Review of Systems  Constitutional:  Negative for chills and fever.  HENT:  Negative for ear pain and sore throat.   Eyes:  Negative for pain and visual disturbance.  Respiratory:  Negative for cough and shortness of breath.   Cardiovascular:  Negative for chest pain and palpitations.  Gastrointestinal:  Positive for abdominal pain, blood in stool, diarrhea and nausea. Negative for vomiting.  Genitourinary:  Negative for dysuria and hematuria.  Musculoskeletal:  Negative for arthralgias and back pain.  Skin:  Negative for color change and rash.  Neurological:  Negative for seizures and syncope.  All other systems reviewed and are negative.   Physical Exam Updated Vital Signs BP 107/68   Pulse 81   Temp 98.3 F (36.8 C) (Oral)   Resp 16   Ht '5\' 4"'$  (1.626 m)   Wt 57 kg   SpO2 97%   BMI 21.57 kg/m  Physical Exam Vitals and nursing note reviewed.  Constitutional:      General: She is not in acute distress.     Appearance: She is well-developed.  HENT:     Head: Normocephalic and atraumatic.  Eyes:     Conjunctiva/sclera: Conjunctivae normal.  Cardiovascular:     Rate and Rhythm: Normal rate and regular rhythm.     Heart sounds: No murmur heard. Pulmonary:     Effort: Pulmonary effort is normal. No respiratory distress.     Breath sounds: Normal breath sounds.  Abdominal:     Palpations: Abdomen is soft.     Tenderness: There is abdominal tenderness in the epigastric area, periumbilical area and left lower quadrant.  Musculoskeletal:        General: No swelling.     Cervical back: Neck supple.  Skin:    General: Skin is warm and dry.     Capillary Refill: Capillary refill takes less than 2 seconds.  Neurological:     Mental Status: She is alert.  Psychiatric:        Mood and Affect: Mood normal.      ED Course/ Medical Decision Making/ A&P Clinical Course as of 07/30/22 2342  Wed Jul 30, 2022  2334 Anion gap: 7 [CC]    Clinical Course User Index [CC] Tretha Sciara, MD    Procedures Procedures   Medications Ordered in ED Medications  lactated ringers bolus 1,000 mL ( Intravenous Restarted 07/30/22 2327)  famotidine (PEPCID) IVPB 20 mg premix (0 mg Intravenous Stopped 07/30/22 2304)  ondansetron (ZOFRAN) injection 4  mg (4 mg Intravenous Given 07/30/22 2232)  morphine (PF) 4 MG/ML injection 4 mg (4 mg Intravenous Given 07/30/22 2232)  iohexol (OMNIPAQUE) 300 MG/ML solution 100 mL (100 mLs Intravenous Contrast Given 07/30/22 2318)   Medical Decision Making:   Diane Gutierrez is a 56 y.o. female who presented to the ED today with abdominal pain, detailed above.    Patient's presentation is complicated by their history of recurrent abdominal pain following in the outpatient setting with gastroenterology.  Patient placed on continuous vitals and telemetry monitoring while in ED which was reviewed periodically.  Complete initial physical exam performed, notably the patient   was hemodynamically stable in no acute distress.  Patient ambulatory tolerating p.o. intake.     Reviewed and confirmed nursing documentation for past medical history, family history, social history.    Initial Assessment:   With the patient's presentation of abdominal pain, most likely diagnosis is nonspecific colitis. Other diagnoses were considered including (but not limited to) gastroenteritis, colitis, small bowel obstruction, appendicitis, cholecystitis, pancreatitis, nephrolithiasis, UTI, pyleonephritis, ruptured ectopic pregnancy, PID, ovarian torsion. These are considered less likely due to history of present illness and physical exam findings.   This is most consistent with an acute life/limb threatening illness complicated by underlying chronic conditions.   Initial Plan:  CBC/CMP to evaluate for underlying infectious/metabolic etiology for patient's abdominal pain  Lipase to evaluate for pancreatitis  EKG to evaluate for cardiac source of pain  CTAB/Pelvis with contrast to evaluate for structural/surgical etiology of patients' severe abdominal pain.  Urinalysis and repeat physical assessment to evaluate for UTI/Pyelonpehritis  Empiric management of symptoms with escalating pain control and antiemetics as needed.   Initial Study Results:   Laboratory  All laboratory results reviewed without evidence of clinically relevant pathology.      EKG EKG was reviewed independently. Rate, rhythm, axis, intervals all examined and without medically relevant abnormality. ST segments without concerns for elevations.    Radiology All images reviewed independently. Agree with radiology report at this time.   CT ABDOMEN PELVIS W CONTRAST  Result Date: 07/30/2022 CLINICAL DATA:  Abdominal pain.  Nausea, diarrhea, bloody stool. EXAM: CT ABDOMEN AND PELVIS WITH CONTRAST TECHNIQUE: Multidetector CT imaging of the abdomen and pelvis was performed using the standard protocol following bolus  administration of intravenous contrast. RADIATION DOSE REDUCTION: This exam was performed according to the departmental dose-optimization program which includes automated exposure control, adjustment of the mA and/or kV according to patient size and/or use of iterative reconstruction technique. CONTRAST:  164m OMNIPAQUE IOHEXOL 300 MG/ML  SOLN COMPARISON:  CT 02/11/2022 FINDINGS: Lower chest: No focal airspace disease or pleural effusion. Hepatobiliary: No focal hepatic abnormality. Cholecystectomy with stable biliary prominence, common bile duct measures 8 mm, normal in the setting prior cholecystectomy. Pancreas: No ductal dilatation or inflammation. Spleen: Normal in size without focal abnormality. Adrenals/Urinary Tract: Normal adrenal glands. No hydronephrosis or perinephric edema. Homogeneous renal enhancement with symmetric excretion on delayed phase imaging. No renal stone or focal lesion. Urinary bladder is physiologically distended without wall thickening. Stomach/Bowel: There is colonic wall thickening with pericolonic edema extending from the splenic flexure through the mid sigmoid colon typical of colitis. Few scattered sigmoid diverticula but no evidence of diverticulitis. 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 Few fluid-filled loops of small bowel in the pelvis, but no inflammatory change, obstruction or pneumatosis. Small hiatal hernia. Otherwise unremarkable stomach. The appendix is not seen. Vascular/Lymphatic: Aortic atherosclerosis without  aneurysm. Patent portal and splenic veins. No acute vascular findings. No abdominopelvic adenopathy. Reproductive: Status post hysterectomy. No adnexal masses. Other: No perforation or abscess. No ascites. No free air. Small fat containing umbilical hernia. Musculoskeletal: There are no acute or suspicious osseous abnormalities. IMPRESSION: 1. Colitis extending from the splenic flexure through the mid  sigmoid colon, likely infectious or inflammatory. 2. Floppy cecum with lax mesentery, currently located in the right mid abdomen, previously in the left lower quadrant. 3. Small hiatal hernia. Aortic Atherosclerosis (ICD10-I70.0). Electronically Signed   By: Keith Rake M.D.   On: 07/30/2022 23:27     Final Reassessment and Plan:   Reassessed patient.  She is currently comfortable in no acute distress.  She endorses resolution of her pain she is been ambulatory to to the bathroom without difficulty. I favor that her presentation is likely infectious given her onset of symptoms.  She has not been able to produce a stool sample at this time.  This may be early ulcerative colitis or Crohn's disease given the presence of bloody diarrhea.  However her symptoms are currently well controlled and she is well-appearing.  She already has scheduled follow-up with gastroenterology in the outpatient setting.  Patient stable for outpatient care and management, recommend continuation of PPI, anti-inflammatories and close follow-up with PCP and gastroenterology.  Recommended she call her PCP if she has recurrent bloody bowel movements for outpatient stool testing when able.  Patient otherwise stable for discharge at this time with no acute indication for intervention.     Clinical Impression:  1. Diarrhea, unspecified type   2. Generalized abdominal pain      Discharge   Final Clinical Impression(s) / ED Diagnoses Final diagnoses:  Diarrhea, unspecified type  Generalized abdominal pain    Rx / DC Orders ED Discharge Orders     None         Tretha Sciara, MD 07/30/22 2342

## 2022-08-05 ENCOUNTER — Telehealth: Payer: Self-pay | Admitting: *Deleted

## 2022-08-05 NOTE — Telephone Encounter (Signed)
   Patient Name: Diane Gutierrez  DOB: 1965-11-26 MRN: [id number]  Primary Cardiologist: Larae Grooms, MD  Chart reviewed as part of pre-operative protocol coverage.   Simple dental extractions (i.e. 1-2 teeth) are considered low risk procedures per guidelines and generally do not require any specific cardiac clearance. It is also generally accepted that for simple extractions and dental cleanings, there is no need to interrupt blood thinner therapy.  SBE prophylaxis is not required for the patient from a cardiac standpoint.  I will route this recommendation to the requesting party via Epic fax function and remove from pre-op pool.  Please call with questions.  Loel Dubonnet, NP 08/05/2022, 12:52 PM

## 2022-08-05 NOTE — Telephone Encounter (Signed)
   Pre-operative Risk Assessment    Patient Name: Diane Gutierrez  DOB: 1966/01/01 MRN: [id number]     Request for Surgical Clearance    Procedure:   1 ROOT CANAL AND CROWN, MULTIPLE FILLINGS  TO BE DONE IN 1 VISIT PER CLEARANCE REQUEST.  THIS INFORMATION WAS CONFIRMED WITH THE DDS OFFICE  Date of Surgery:  Clearance TBD                                 Surgeon:  DR. Lavonne Chick, DDS Surgeon's Group or Practice Name:  [facility]  Phone number:  [phone] Fax number:  [phone]   Type of Clearance Requested:   - Medical ; NO MEDICATIONS LISTED AS NEEDING TO BE HELD   Type of Anesthesia:   IV SEDATION, MAY USE ANY OF THE FOLLOWING: TRIAZOLAM, LORAZEPAM, HYDROXYZINE, VERSED, FENTANYL, DEXAMETHASONE, TORADOL, DEMEROL, ZOFRAN   Additional requests/questions:    Jiles Prows   08/05/2022, 8:19 AM

## 2022-08-06 IMAGING — US US BREAST*L* LIMITED INC AXILLA
1 series · 3 of 3 positions shown · non-contrast
Comparison: Previous exam(s).

CLINICAL DATA: Bilateral breast pain, left greater than right. Pain
is nonfocal.

EXAM:
DIGITAL DIAGNOSTIC BILATERAL MAMMOGRAM WITH CAD AND TOMO
ULTRASOUND BILATERAL BREAST

[Series 1: us breast*left* limited inc axilla · 0.07mm/px · 3 of 3 slices shown]
[im 1/3]
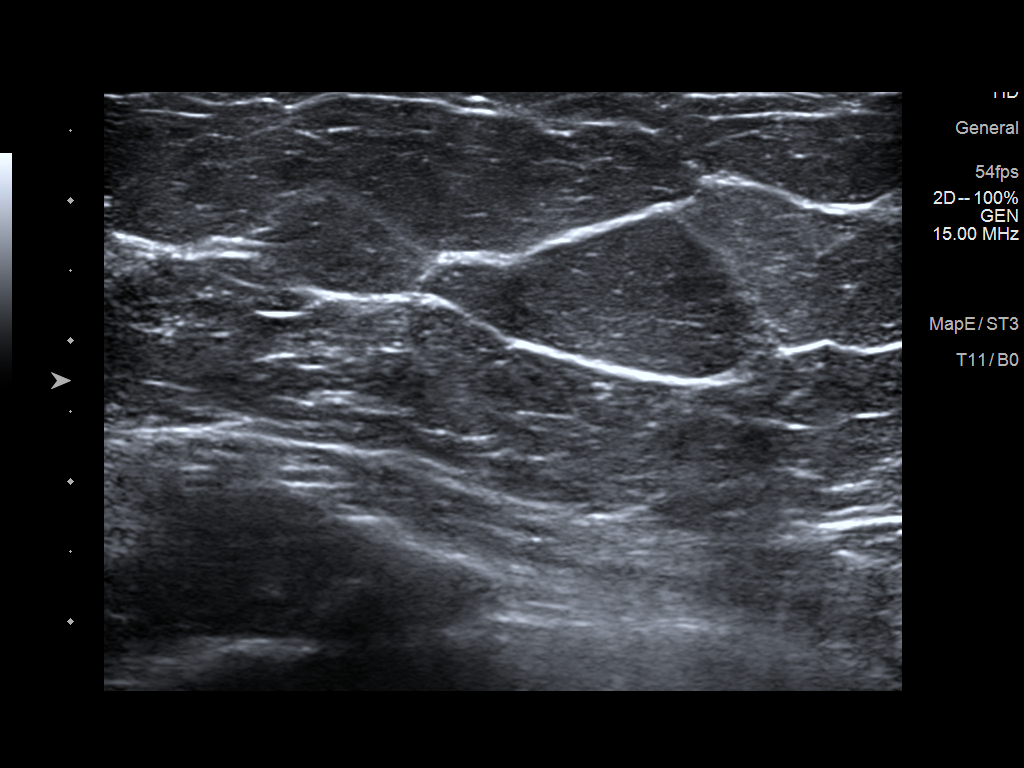
[im 2/3]
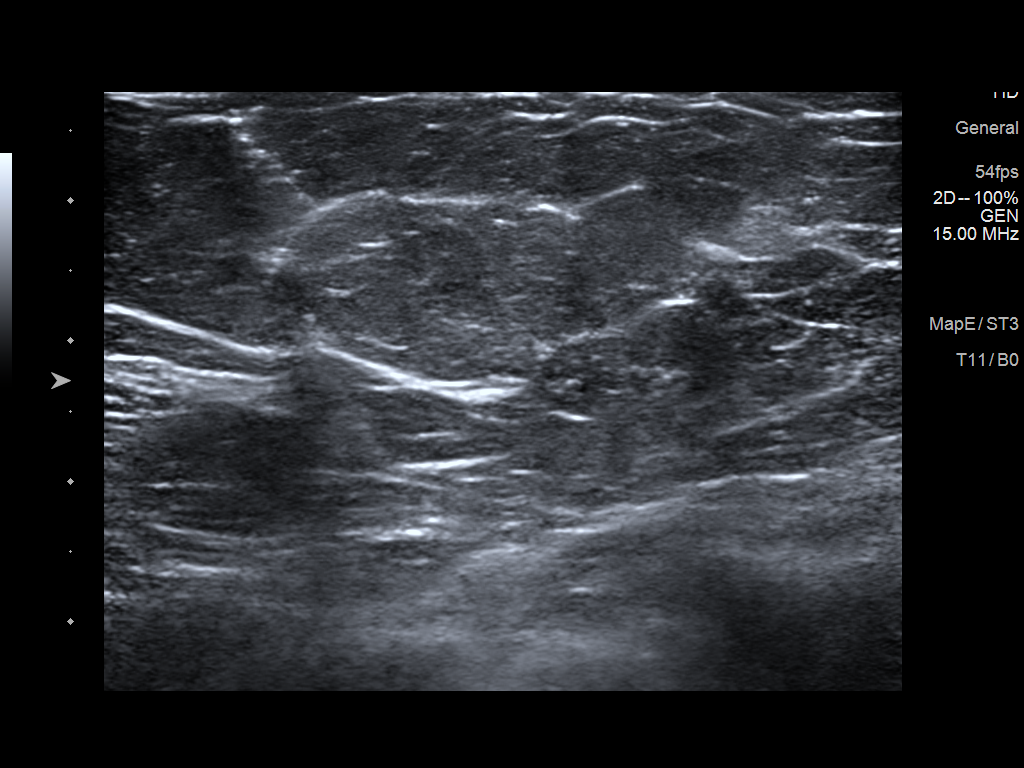
[im 3/3]
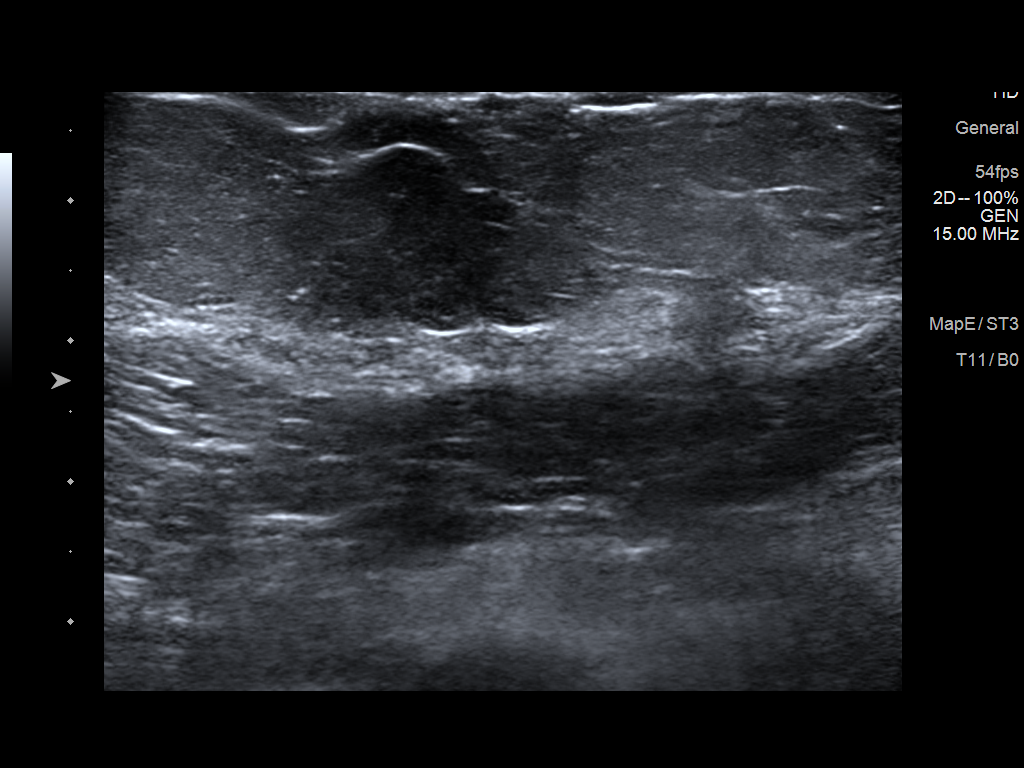

[3 of 3 positions shown; findings below may reference images not displayed]

ACR Breast Density Category c: The breast tissue is heterogeneously
dense, which may obscure small masses.
FINDINGS: Possible distortion on the right resolves on additional imaging. No
suspicious masses, calcifications, or distortion are identified in
either breast.

Mammographic images were processed with CAD.

On physical exam, no suspicious lumps are identified.

Targeted ultrasound is performed, showing multiple dilated ducts
containing fluid and debris in the right retroareolar region. No
suspicious findings seen on the left.
IMPRESSION: No mammographic or sonographic evidence of malignancy.

RECOMMENDATION:
Treatment of the patient's symptoms should be based on clinical and
physical exam given lack of imaging findings. Recommend annual
screening mammography.

I have discussed the findings and recommendations with the patient.
If applicable, a reminder letter will be sent to the patient
regarding the next appointment.

BI-RADS CATEGORY  2: Benign.

## 2022-08-06 IMAGING — MG DIGITAL DIAGNOSTIC BILAT W/ TOMO W/ CAD
8 of 15 series · 8 of 40 positions shown · non-contrast
Comparison: Previous exam(s).

CLINICAL DATA: Bilateral breast pain, left greater than right. Pain
is nonfocal.

EXAM:
DIGITAL DIAGNOSTIC BILATERAL MAMMOGRAM WITH CAD AND TOMO
ULTRASOUND BILATERAL BREAST

[L MLO synth-2D]
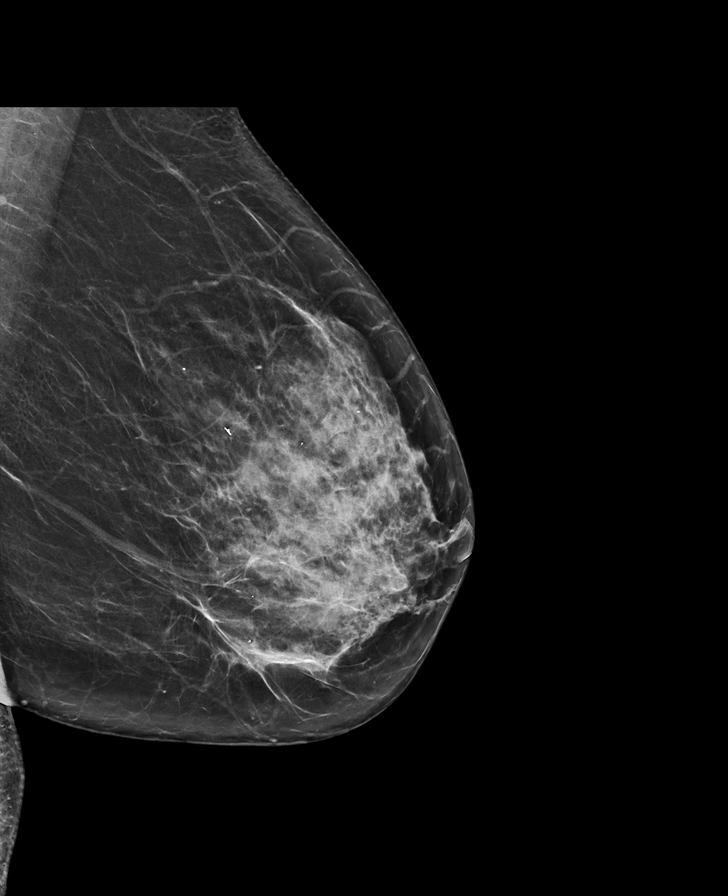

[R CC synth-2D (1 of 3)]
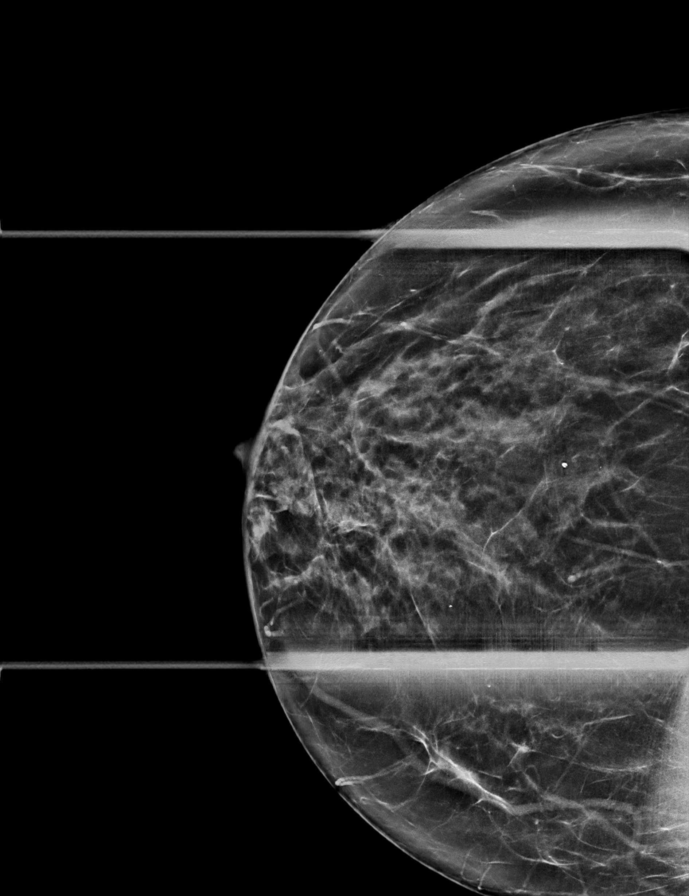

[R MLO synth-2D]
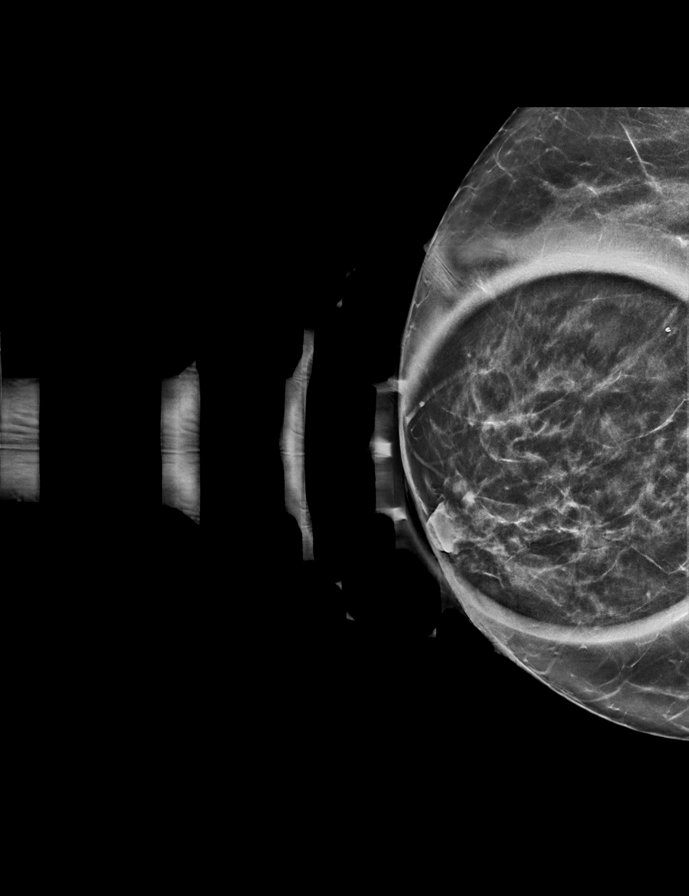

[R CC synth-2D (2 of 3)]
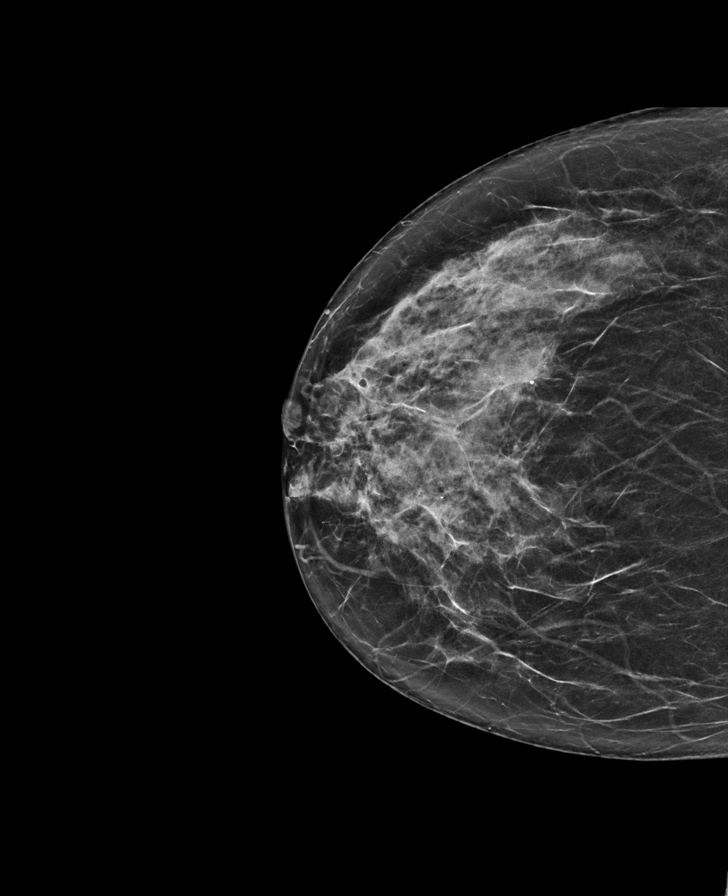

[L CC synth-2D]
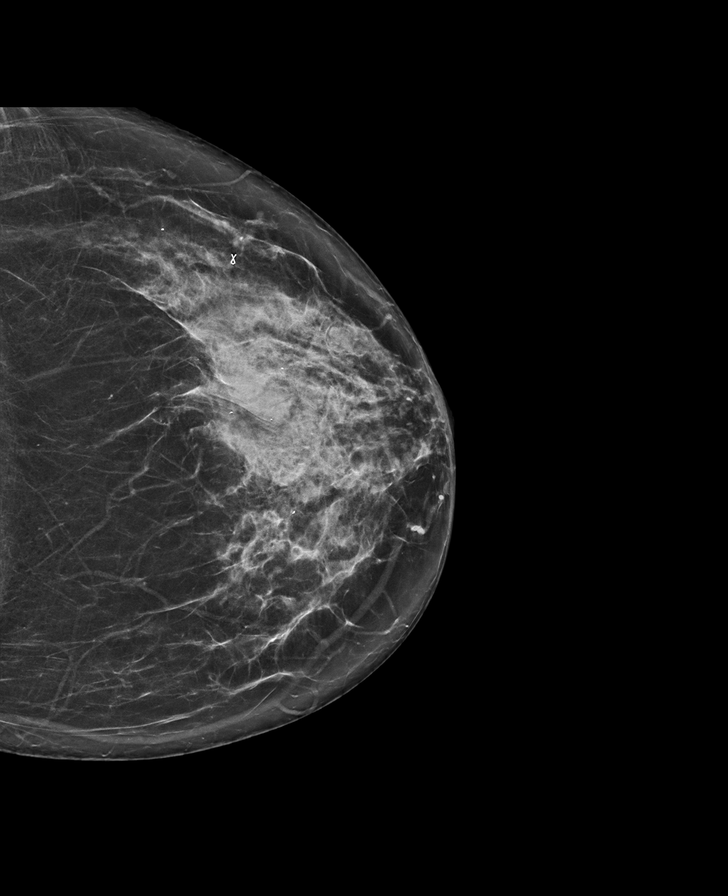

[R CC synth-2D (3 of 3)]
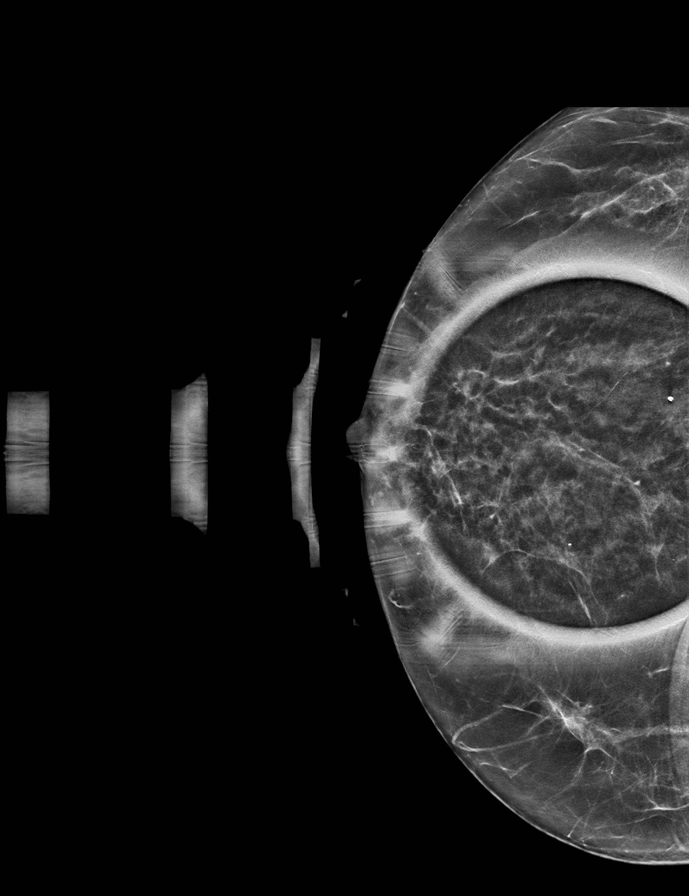

[R ML synth-2D]
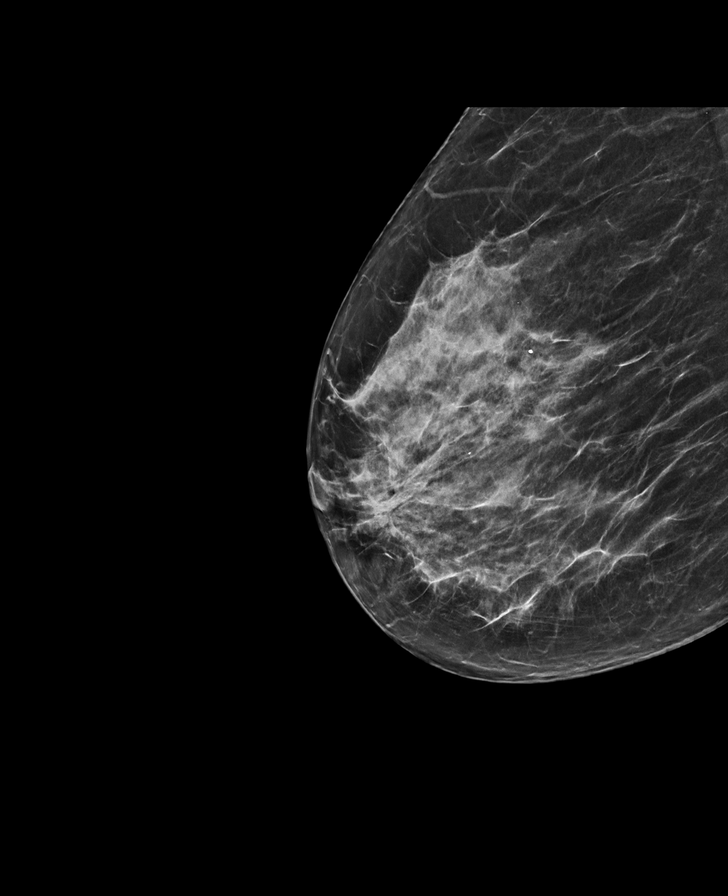

[R CC tomo · tomo slice 44/64.0]
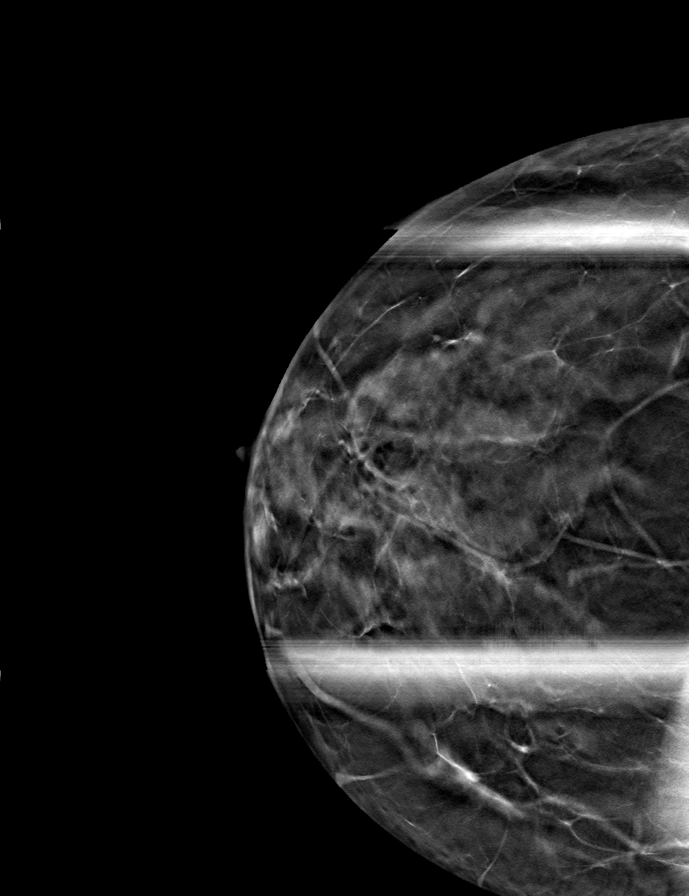

[8 of 40 positions shown; findings below may reference images not displayed]

ACR Breast Density Category c: The breast tissue is heterogeneously
dense, which may obscure small masses.
FINDINGS: Possible distortion on the right resolves on additional imaging. No
suspicious masses, calcifications, or distortion are identified in
either breast.

Mammographic images were processed with CAD.

On physical exam, no suspicious lumps are identified.

Targeted ultrasound is performed, showing multiple dilated ducts
containing fluid and debris in the right retroareolar region. No
suspicious findings seen on the left.
IMPRESSION: No mammographic or sonographic evidence of malignancy.

RECOMMENDATION:
Treatment of the patient's symptoms should be based on clinical and
physical exam given lack of imaging findings. Recommend annual
screening mammography.

I have discussed the findings and recommendations with the patient.
If applicable, a reminder letter will be sent to the patient
regarding the next appointment.

BI-RADS CATEGORY  2: Benign.

## 2022-08-20 DIAGNOSIS — R1013 Epigastric pain: Secondary | ICD-10-CM | POA: Diagnosis not present

## 2022-08-20 DIAGNOSIS — Z9049 Acquired absence of other specified parts of digestive tract: Secondary | ICD-10-CM | POA: Diagnosis not present

## 2022-08-20 DIAGNOSIS — Z0189 Encounter for other specified special examinations: Secondary | ICD-10-CM | POA: Diagnosis not present

## 2022-08-20 DIAGNOSIS — K589 Irritable bowel syndrome without diarrhea: Secondary | ICD-10-CM | POA: Diagnosis not present

## 2022-08-20 DIAGNOSIS — G8929 Other chronic pain: Secondary | ICD-10-CM | POA: Diagnosis not present

## 2022-08-20 DIAGNOSIS — Z800 Family history of malignant neoplasm of digestive organs: Secondary | ICD-10-CM | POA: Diagnosis not present

## 2022-08-20 DIAGNOSIS — Z79899 Other long term (current) drug therapy: Secondary | ICD-10-CM | POA: Diagnosis not present

## 2022-08-26 ENCOUNTER — Encounter: Payer: Self-pay | Admitting: Family Medicine

## 2022-08-26 ENCOUNTER — Ambulatory Visit (INDEPENDENT_AMBULATORY_CARE_PROVIDER_SITE_OTHER): Payer: Medicare Other | Admitting: Family Medicine

## 2022-08-26 VITALS — BP 108/71 | HR 70 | Ht 64.0 in | Wt 125.0 lb

## 2022-08-26 DIAGNOSIS — Z0000 Encounter for general adult medical examination without abnormal findings: Secondary | ICD-10-CM | POA: Insufficient documentation

## 2022-08-26 DIAGNOSIS — Z800 Family history of malignant neoplasm of digestive organs: Secondary | ICD-10-CM | POA: Diagnosis not present

## 2022-08-26 DIAGNOSIS — Z1231 Encounter for screening mammogram for malignant neoplasm of breast: Secondary | ICD-10-CM

## 2022-08-26 NOTE — Progress Notes (Signed)
New Patient Office Visit  Subjective    Patient ID: Diane Gutierrez, female    DOB: 12/25/1965  Age: 56 y.o. MRN: [id number]  CC:  Chief Complaint  Patient presents with   GI Problem    HPI Diane Gutierrez presents to establish care. She is experiencing stomach pain that is chronic in nature. She has a strong family history of colon cancer and was previously follow by GI, but it has been taking a while to get in with them and she would like to switch. She was seen in the ED for colitis a few weeks ago. She says that pain has resolved in severity but she does have residual abdominal pain.   Recent colonoscopy from 09/2021 shows 10-19cm polyp that was surgically excised. Follow up recommendations were unclear to patient.   She has a strong family history of breast cancer as well. Sister had breast cancer and she has a history of abnormal mammograms. 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   Outpatient Encounter Medications as of 08/26/2022  Medication Sig   acetaminophen (TYLENOL) 500 MG tablet Take 500 mg by mouth every 6 (six) hours as needed for mild pain or headache.   Ascorbic Acid (VITAMIN C) 100 MG tablet Take 100 mg by mouth daily.   BIOTIN PO Take 1 tablet by mouth daily.   Cholecalciferol (VITAMIN D3) 1.25 MG (50000 UT) TABS Take 1 tablet by mouth every other day.   dicyclomine (BENTYL) 20 MG tablet Take 1 tablet (20 mg total) by mouth 2 (two) times daily as needed for spasms.   ELDERBERRY PO Take 1 tablet by mouth daily.   Krill Oil 1000 MG CAPS Take 350 mg by mouth.    metoprolol succinate (TOPROL-XL) 25 MG 24 hr tablet Take 1 tablet (25 mg total) by mouth daily. With or immediately following a meal.   Multiple Vitamin (MULTIVITAMIN) tablet Take 1 tablet by mouth daily.   nitroGLYCERIN (NITROSTAT) 0.4 MG SL tablet Place 1 tablet (0.4 mg total) under the tongue every 5 (five) minutes as needed for chest pain.   omeprazole (PRILOSEC) 20 MG capsule TAKE ONE  CAPSULE BY MOUTH ONE TIME DAILY **MUST CALL DR. FOR APPOINTMENT**   promethazine (PHENERGAN) 12.5 MG tablet Take 1 tablet (12.5 mg total) by mouth every 6 (six) hours as needed for nausea or vomiting.   [DISCONTINUED] metoprolol tartrate (LOPRESSOR) 50 MG tablet Take 1 tablet by mouth 2 hours prior to Cardiac CT   No facility-administered encounter medications on file as of 08/26/2022.    Past Medical History:  Diagnosis Date   Abdominal pain    Acid reflux    Anxiety    Colicky RLQ abdominal pain 07/07/2019   Fibrocystic breast 07/10/2019   Hiatal hernia    Hyperlipidemia    Leukopenia    Low back pain    Lung nodule    Neck pain    Palpitations    Seasonal allergies    Seizure (HCC)    childhood   Sneezing    SOB (shortness of breath)    Echo 10/22: EF 60-65, no RWMA, normal RVSF, mild MR   Tachycardia    Tachycardia 21/30/8657   Umbilical hernia    Weight gain     Past Surgical History:  Procedure Laterality Date   BREAST BIOPSY Left    BREAST SURGERY     lumpectomy, cyst aspirated.on left   CHOLECYSTECTOMY     HYSTEROSCOPY DIAGNOSTIC  JOINT REPLACEMENT Left    knee x 2 with screws in place   KNEE SURGERY     NECK SURGERY     Dr Patrice Paradise   ROBOTIC ASSISTED TOTAL HYSTERECTOMY     precancerous, with BSO    Family History  Problem Relation Age of Onset   Arthritis Mother    Hearing loss Mother    Heart disease Mother        MI   Hyperlipidemia Mother    Thyroid cancer Mother    Hypertension Father    Heart disease Father        s/p CABG   Diabetes Father    Hyperlipidemia Father    Other Father    Drug abuse Sister        crack cocaine   Other Sister    Diabetes Sister        smoker   Bipolar disorder Sister    Heart disease Brother    Sleep apnea Brother    Arthritis Maternal Grandmother    Heart disease Maternal Grandmother    Arthritis Maternal Grandfather    Heart disease Maternal Grandfather    Heart disease Paternal Grandmother     Cancer Paternal Grandfather    Heart disease Paternal Grandfather    Colon cancer Neg Hx    Esophageal cancer Neg Hx    Rectal cancer Neg Hx    Stomach cancer Neg Hx     Social History   Socioeconomic History   Marital status: Married    Spouse name: Not on file   Number of children: Not on file   Years of education: Not on file   Highest education level: Not on file  Occupational History   Not on file  Tobacco Use   Smoking status: Never   Smokeless tobacco: Never  Vaping Use   Vaping Use: Never used  Substance and Sexual Activity   Alcohol use: Not Currently   Drug use: Never   Sexual activity: Not Currently  Other Topics Concern   Not on file  Social History Narrative   Lives with husband is on disability, previously worked for [employer], no dietary restrictions.    Social Determinants of Health   Financial Resource Strain: Low Risk  (12/13/2021)   Overall Financial Resource Strain (CARDIA)    Difficulty of Paying Living Expenses: Not hard at all  Food Insecurity: No Food Insecurity (12/13/2021)   Hunger Vital Sign    Worried About Running Out of Food in the Last Year: Never true    Ran Out of Food in the Last Year: Never true  Transportation Needs: No Transportation Needs (12/13/2021)   PRAPARE - Hydrologist (Medical): No    Lack of Transportation (Non-Medical): No  Physical Activity: Sufficiently Active (12/13/2021)   Exercise Vital Sign    Days of Exercise per Week: 7 days    Minutes of Exercise per Session: 30 min  Stress: No Stress Concern Present (12/13/2021)   [place]    Feeling of Stress : Only a little  Social Connections: Moderately Isolated (12/13/2021)   Social Connection and Isolation Panel [NHANES]    Frequency of Communication with Friends and Family: More than three times a week    Frequency of Social Gatherings with Friends and Family: Once a week     Attends Religious Services: Never    Marine scientist or Organizations: No  Attends Archivist Meetings: Never    Marital Status: Married  Human resources officer Violence: Not At Risk (12/13/2021)   Humiliation, Afraid, Rape, and Kick questionnaire    Fear of Current or Ex-Partner: No    Emotionally Abused: No    Physically Abused: No    Sexually Abused: No    Review of Systems  Constitutional:  Negative for chills and fever.  Respiratory:  Negative for cough and shortness of breath.   Cardiovascular:  Negative for chest pain.  Gastrointestinal:  Positive for abdominal pain.  Neurological:  Negative for headaches.        Objective    BP 108/71   Pulse 70   Ht '5\' 4"'$  (1.626 m)   Wt 125 lb (56.7 kg)   SpO2 100%   BMI 21.46 kg/m   Physical Exam Vitals and nursing note reviewed.  Constitutional:      General: She is not in acute distress.    Appearance: Normal appearance.  HENT:     Head: Normocephalic and atraumatic.     Right Ear: External ear normal.     Left Ear: External ear normal.     Nose: Nose normal.  Eyes:     Conjunctiva/sclera: Conjunctivae normal.  Cardiovascular:     Rate and Rhythm: Normal rate and regular rhythm.  Pulmonary:     Effort: Pulmonary effort is normal.     Breath sounds: Normal breath sounds.  Abdominal:     General: Abdomen is flat. Bowel sounds are normal.     Palpations: Abdomen is soft.     Comments: Right lower quadrant tenderness to palpation. Negative mcburney.   Neurological:     General: No focal deficit present.     Mental Status: She is alert and oriented to person, place, and time.  Psychiatric:        Mood and Affect: Mood normal.        Behavior: Behavior normal.        Thought Content: Thought content normal.        Judgment: Judgment normal.        Assessment & Plan:   Problem List Items Addressed This Visit       Other   Family history of colon cancer - Primary    - will send referral to new  GI doc  - would like recommendations for repeat colonoscopy considering one done in 09/2021 had a 10-19cm polyp that was cancerous with recommendation for follow up in two years      Encounter for screening mammogram for malignant neoplasm of breast    - mammogram ordered  - unsure if she also needs a diagnostic mammogram at this time.       Relevant Orders   MM DIGITAL SCREENING BILATERAL    Return in about 4 weeks (around 09/23/2022) for wellness exam with blood work .   Owens Loffler, DO

## 2022-08-26 NOTE — Assessment & Plan Note (Signed)
-   mammogram ordered  - unsure if she also needs a diagnostic mammogram at this time.

## 2022-08-26 NOTE — Patient Instructions (Addendum)
It was great meeting you today!   Let me know about GI specialist you want to see   When you hear back from your mammogram, ask them if you need a diagnostic at the same time with your history so we can schedule both the same day.   Schedule visit within the next month or two for your wellness visit. We can do blood work before hand just let our office know so proper orders can be placed!

## 2022-08-26 NOTE — Assessment & Plan Note (Signed)
-   will send referral to new GI doc  - would like recommendations for repeat colonoscopy considering one done in 09/2021 had a 10-19cm polyp that was cancerous with recommendation for follow up in two years

## 2022-08-28 ENCOUNTER — Other Ambulatory Visit: Payer: Self-pay | Admitting: Family Medicine

## 2022-08-28 ENCOUNTER — Encounter: Payer: Self-pay | Admitting: Family Medicine

## 2022-08-28 DIAGNOSIS — Z1231 Encounter for screening mammogram for malignant neoplasm of breast: Secondary | ICD-10-CM

## 2022-08-30 NOTE — Progress Notes (Unsigned)
Cardiology Office Note   Date:  08/30/2022   ID:  Diane, Gutierrez 11/05/1966, MRN [id number]  PCP:  Owens Loffler, DO    No chief complaint on file.  Chest pain  Wt Readings from Last 3 Encounters:  08/26/22 125 lb (56.7 kg)  07/30/22 125 lb 10.6 oz (57 kg)  02/11/22 124 lb (56.2 kg)       History of Present Illness: Diane Gutierrez is a 56 y.o. female   with:  Palpitations  Monitor in past with rare PVCs FHx of CAD CT in 2021: CAC score 0, no CAD  GERD  Hyperlipidemia  Anxiety    Prior CV studies: LONG TERM MONITOR (8-14 DAYS) INTERPRETATION 04/18/2021 Narrative  Normal sinus rhythm with rare PACs and PVCs.  No sustained arrhythmias.  No atrial fibrillation.   Coronary CTA 05/15/20 IMPRESSION: 1. No evidence of CAD, CADRADS = 0. 2. Coronary calcium score of 0. This was 0 percentile for age and sex matched control. 3. Normal coronary origin with right dominance.   Echocardiogram 03/25/18 ([place]) Summary  Normal left ventricular size and systolic function with no appreciable  segmental abnormality.  Ejection fraction is visually estimated at 78-24%  Normal diastolic function.  Normal size left atrium.  Mild mitral regurgitation.  Mild tricuspid regurgitation.  RVSP 21 mm Hg.    Husband had an MI in 2022.  She was under a lot of stress.  Her father has not been doing well and she had to travel to [place] did a virtual visit in 1/23.   She had some more chest discomfort in October 2022 and saw [facility].  Metoprolol taken regularly.  COncern for vasospasm. Long-acting nitrates were considered but NOT started.  Trial of omeprazole was started.  She takes the PPI as needed.    Echo in 2022 showed: "Echocardiogram demonstrates normal heart function (ejection fraction).  There is no significant valve disease."   Colonoscopy in 2022.  Had a large polyp.    THere was a concern for a neuro disease like Charcot-Marie-Tooth.  She  reports some muscle loss.   Denies :exertional Chest pain. Dizziness. Leg edema. Nitroglycerin use. Orthopnea. Palpitations. Paroxysmal nocturnal dyspnea. Shortness of breath. Syncope.    Chronic abdominal pain  Past Medical History:  Diagnosis Date   Abdominal pain    Acid reflux    Anxiety    Colicky RLQ abdominal pain 07/07/2019   Fibrocystic breast 07/10/2019   Hiatal hernia    Hyperlipidemia    Leukopenia    Low back pain    Lung nodule    Neck pain    Palpitations    Seasonal allergies    Seizure (HCC)    childhood   Sneezing    SOB (shortness of breath)    Echo 10/22: EF 60-65, no RWMA, normal RVSF, mild MR   Tachycardia    Tachycardia 23/53/6144   Umbilical hernia    Weight gain     Past Surgical History:  Procedure Laterality Date   BREAST BIOPSY Left    BREAST SURGERY     lumpectomy, cyst aspirated.on left   CHOLECYSTECTOMY     COLONOSCOPY W/ POLYPECTOMY     HYSTEROSCOPY DIAGNOSTIC     JOINT REPLACEMENT Left    knee x 2 with screws in place   KNEE SURGERY     NECK SURGERY     Dr Patrice Paradise   ROBOTIC ASSISTED TOTAL HYSTERECTOMY     precancerous, with BSO  Current Outpatient Medications  Medication Sig Dispense Refill   acetaminophen (TYLENOL) 500 MG tablet Take 500 mg by mouth every 6 (six) hours as needed for mild pain or headache.     Ascorbic Acid (VITAMIN C) 100 MG tablet Take 100 mg by mouth daily.     BIOTIN PO Take 1 tablet by mouth daily.     Cholecalciferol (VITAMIN D3) 1.25 MG (50000 UT) TABS Take 1 tablet by mouth every other day.     dicyclomine (BENTYL) 20 MG tablet Take 1 tablet (20 mg total) by mouth 2 (two) times daily as needed for spasms. 22 tablet 0   ELDERBERRY PO Take 1 tablet by mouth daily.     Krill Oil 1000 MG CAPS Take 350 mg by mouth.      metoprolol succinate (TOPROL-XL) 25 MG 24 hr tablet Take 1 tablet (25 mg total) by mouth daily. With or immediately following a meal. 90 tablet 3   Multiple Vitamin (MULTIVITAMIN) tablet  Take 1 tablet by mouth daily.     nitroGLYCERIN (NITROSTAT) 0.4 MG SL tablet Place 1 tablet (0.4 mg total) under the tongue every 5 (five) minutes as needed for chest pain. 25 tablet 1   omeprazole (PRILOSEC) 20 MG capsule TAKE ONE CAPSULE BY MOUTH ONE TIME DAILY **MUST CALL DR. FOR APPOINTMENT** 90 capsule 0   promethazine (PHENERGAN) 12.5 MG tablet Take 1 tablet (12.5 mg total) by mouth every 6 (six) hours as needed for nausea or vomiting. 30 tablet 3   No current facility-administered medications for this visit.    Allergies:   Prednisone and Ibuprofen    Social History:  The patient  reports that she has never smoked. She has never used smokeless tobacco. She reports that she does not currently use alcohol. She reports that she does not use drugs.   Family History:  The patient's family history includes Arthritis in her maternal grandfather, maternal grandmother, and mother; Bipolar disorder in her sister; Cancer in her paternal grandfather; Diabetes in her father and sister; Drug abuse in her sister; Hearing loss in her mother; Heart disease in her brother, father, maternal grandfather, maternal grandmother, mother, paternal grandfather, and paternal grandmother; Hyperlipidemia in her father and mother; Hypertension in her father; Other in her father and sister; Sleep apnea in her brother; Thyroid cancer in her mother.    ROS:  Please see the history of present illness.   Otherwise, review of systems are positive for numbness in feet; chronic abdominal pain.   All other systems are reviewed and negative.    PHYSICAL EXAM: VS:  There were no vitals taken for this visit. , BMI There is no height or weight on file to calculate BMI. GEN: Well nourished, well developed, in no acute distress HEENT: normal Neck: no JVD, carotid bruits, or masses Cardiac: RRR; no murmurs, rubs, or gallops,no edema  Respiratory:  clear to auscultation bilaterally, normal work of breathing GI: soft, nontender,  nondistended, + BS MS: no deformity or atrophy Skin: warm and dry, no rash Neuro:  Strength and sensation are intact Psych: euthymic mood, full affect   EKG:   The ekg ordered today demonstrates NSR, no ST changes   Recent Labs: 07/30/2022: ALT 16; BUN 18; Creatinine, Ser 0.71; Hemoglobin 13.4; Platelets 232; Potassium 4.0; Sodium 138   Lipid Panel    Component Value Date/Time   CHOL 227 (H) 08/20/2021 0840   TRIG 134.0 08/20/2021 0840   HDL 56.20 08/20/2021 0840   CHOLHDL 4 08/20/2021  0840   VLDL 26.8 08/20/2021 0840   LDLCALC 144 (H) 08/20/2021 0840   LDLCALC 159 (H) 08/16/2020 6503     Other studies Reviewed: Additional studies/ records that were reviewed today with results demonstrating: LDL 144.   ASSESSMENT AND PLAN:  Chest pain: No exertional chest pain.  Worse with food.  Worse when she had severe food poisoning.  Negative prior coronary CTA. Palpitations: PVCs noted in the past.  Symptoms triggered by emotional stress. Hyperlipidemia: LDL 144.  Calcium score 0 so managing this with healthy lifestyle.   Current medicines are reviewed at length with the patient today.  The patient concerns regarding her medicines were addressed.  The following changes have been made:  No change  Labs/ tests ordered today include:  No orders of the defined types were placed in this encounter.   Recommend 150 minutes/week of aerobic exercise Low fat, low carb, high fiber diet recommended  Disposition:   FU in 1 year   Signed, Larae Grooms, MD  08/30/2022 7:45 PM    [facility] [address], [place], [place]  [address] Phone: [phone]; Fax: [phone]

## 2022-09-01 ENCOUNTER — Ambulatory Visit: Payer: Medicare Other | Attending: Interventional Cardiology | Admitting: Interventional Cardiology

## 2022-09-01 ENCOUNTER — Encounter: Payer: Self-pay | Admitting: Interventional Cardiology

## 2022-09-01 VITALS — BP 100/74 | HR 63 | Ht 65.0 in | Wt 124.0 lb

## 2022-09-01 DIAGNOSIS — I493 Ventricular premature depolarization: Secondary | ICD-10-CM

## 2022-09-01 DIAGNOSIS — R072 Precordial pain: Secondary | ICD-10-CM | POA: Diagnosis not present

## 2022-09-01 DIAGNOSIS — R002 Palpitations: Secondary | ICD-10-CM | POA: Diagnosis not present

## 2022-09-01 NOTE — Patient Instructions (Signed)
Medication Instructions:  Your physician recommends that you continue on your current medications as directed. Please refer to the Current Medication list given to you today.  *If you need a refill on your cardiac medications before your next appointment, please call your pharmacy*   Lab Work: none If you have labs (blood work) drawn today and your tests are completely normal, you will receive your results only by: MyChart Message (if you have MyChart) OR A paper copy in the mail If you have any lab test that is abnormal or we need to change your treatment, we will call you to review the results.   Testing/Procedures: none   Follow-Up: At [facility], you and your health needs are our priority.  As part of our continuing mission to provide you with exceptional heart care, we have created designated Provider Care Teams.  These Care Teams include your primary Cardiologist (physician) and Advanced Practice Providers (APPs -  Physician Assistants and Nurse Practitioners) who all work together to provide you with the care you need, when you need it.  We recommend signing up for the patient portal called "MyChart".  Sign up information is provided on this After Visit Summary.  MyChart is used to connect with patients for Virtual Visits (Telemedicine).  Patients are able to view lab/test results, encounter notes, upcoming appointments, etc.  Non-urgent messages can be sent to your provider as well.   To learn more about what you can do with MyChart, go to [email or web address].    Your next appointment:   12 month(s)  The format for your next appointment:   In Person  Provider:   Jayadeep Varanasi, MD     Other Instructions    Important Information About Sugar       

## 2022-09-04 ENCOUNTER — Telehealth: Payer: Self-pay | Admitting: Family Medicine

## 2022-09-04 NOTE — Telephone Encounter (Signed)
Pt called with [facility] Rep regarding and issue with her Medicare Annual Wellness Visit on 2.3.23. Pt stated that Wendling's name was on the visit and she did not understand why as Jana Half was the one who facilitated it. After reviewing this appt and other past appts, was unable to determine why his name was on it as she had never seen Wendling during her time here. Rep also had questions as to why this appt was coded as a "wellness visit" as the pt never came in and had her CPE done. Explained to pt and rep the difference between a Medicare AWV and a CPE/Yearly Exam stating that each should be covered by insurance as long as there was no overlap in the same appt type within a year and a day. Pt acknowledged understanding this but expressed that this was very confusing and should be explained to the pt up front. Pt stated that she wanted to still have this visit looked into and wanted it to be filed correctly to ensure it would be covered by insurance. Pt also would like a call back when re-file has taken place so she is aware of its status.

## 2022-09-05 NOTE — Telephone Encounter (Signed)
Called Pt and advised that Wendling cosigned on that appt only to indicate it was completed in Dr. Frederik Pear absence. Pt acknowledged understanding and offered thanks for reaching out regarding this issue.

## 2022-09-16 ENCOUNTER — Telehealth: Payer: Self-pay

## 2022-09-17 DIAGNOSIS — N644 Mastodynia: Secondary | ICD-10-CM | POA: Diagnosis not present

## 2022-09-25 ENCOUNTER — Other Ambulatory Visit: Payer: Self-pay | Admitting: Family Medicine

## 2022-09-25 DIAGNOSIS — E785 Hyperlipidemia, unspecified: Secondary | ICD-10-CM | POA: Diagnosis not present

## 2022-09-25 DIAGNOSIS — Z0000 Encounter for general adult medical examination without abnormal findings: Secondary | ICD-10-CM

## 2022-09-26 LAB — COMPLETE METABOLIC PANEL WITH GFR
AG Ratio: 1.7 (calc) (ref 1.0–2.5)
ALT: 17 U/L (ref 6–29)
AST: 25 U/L (ref 10–35)
Albumin: 4.5 g/dL (ref 3.6–5.1)
Alkaline phosphatase (APISO): 63 U/L (ref 37–153)
BUN: 12 mg/dL (ref 7–25)
CO2: 29 mmol/L (ref 20–32)
Calcium: 10 mg/dL (ref 8.6–10.4)
Chloride: 104 mmol/L (ref 98–110)
Creat: 0.8 mg/dL (ref 0.50–1.03)
Globulin: 2.7 g/dL (calc) (ref 1.9–3.7)
Glucose, Bld: 87 mg/dL (ref 65–99)
Potassium: 4.6 mmol/L (ref 3.5–5.3)
Sodium: 141 mmol/L (ref 135–146)
Total Bilirubin: 0.6 mg/dL (ref 0.2–1.2)
Total Protein: 7.2 g/dL (ref 6.1–8.1)
eGFR: 86 mL/min/{1.73_m2} (ref >=60)

## 2022-09-26 LAB — CBC
HCT: 42.9 % (ref 35.0–45.0)
Hemoglobin: 14.6 g/dL (ref 11.7–15.5)
MCH: 30.4 pg (ref 27.0–33.0)
MCHC: 34 g/dL (ref 32.0–36.0)
MCV: 89.2 fL (ref 80.0–100.0)
MPV: 9.9 fL (ref 7.5–12.5)
Platelets: 264 10*3/uL (ref 140–400)
RBC: 4.81 10*6/uL (ref 3.80–5.10)
RDW: 11.9 % (ref 11.0–15.0)
WBC: 6.7 10*3/uL (ref 3.8–10.8)

## 2022-09-26 LAB — LIPID PANEL
Cholesterol: 206 mg/dL — ABNORMAL HIGH (ref <200)
HDL: 52 mg/dL (ref >=50)
LDL Cholesterol (Calc): 131 mg/dL (calc) — ABNORMAL HIGH
Non-HDL Cholesterol (Calc): 154 mg/dL (calc) — ABNORMAL HIGH (ref <130)
Total CHOL/HDL Ratio: 4 (calc) (ref <5.0)
Triglycerides: 124 mg/dL (ref <150)

## 2022-09-30 ENCOUNTER — Ambulatory Visit (INDEPENDENT_AMBULATORY_CARE_PROVIDER_SITE_OTHER): Payer: Medicare Other | Admitting: Family Medicine

## 2022-09-30 ENCOUNTER — Encounter: Payer: Self-pay | Admitting: Family Medicine

## 2022-09-30 VITALS — BP 118/74 | HR 68 | Ht 65.0 in | Wt 123.0 lb

## 2022-09-30 DIAGNOSIS — Z0000 Encounter for general adult medical examination without abnormal findings: Secondary | ICD-10-CM | POA: Diagnosis not present

## 2022-09-30 DIAGNOSIS — R110 Nausea: Secondary | ICD-10-CM | POA: Diagnosis not present

## 2022-09-30 DIAGNOSIS — Z98818 Other dental procedure status: Secondary | ICD-10-CM | POA: Diagnosis not present

## 2022-09-30 DIAGNOSIS — K219 Gastro-esophageal reflux disease without esophagitis: Secondary | ICD-10-CM | POA: Diagnosis not present

## 2022-09-30 DIAGNOSIS — R928 Other abnormal and inconclusive findings on diagnostic imaging of breast: Secondary | ICD-10-CM

## 2022-09-30 MED ORDER — PROMETHAZINE HCL 12.5 MG PO TABS: 12.5000 mg | ORAL_TABLET | Freq: Four times a day (QID) | ORAL | 0 refills | Status: DC | PRN

## 2022-09-30 MED ORDER — HYDROCODONE-ACETAMINOPHEN 5-325 MG PO TABS: 1.0000 | ORAL_TABLET | Freq: Two times a day (BID) | ORAL | 0 refills | Status: DC | PRN

## 2022-09-30 MED ORDER — OMEPRAZOLE 20 MG PO CPDR: 20.0000 mg | DELAYED_RELEASE_CAPSULE | Freq: Every day | ORAL | 0 refills | Status: DC

## 2022-09-30 NOTE — Patient Instructions (Signed)
Follow up in 6 months 

## 2022-09-30 NOTE — Assessment & Plan Note (Signed)
Have sent refill for nausea

## 2022-09-30 NOTE — Progress Notes (Addendum)
Established patient visit   Patient: Diane Gutierrez   DOB: 05-09-1966   56 y.o. Female  MRN: [id number] Visit Date: 09/30/2022  Today's healthcare provider: Owens Loffler, DO   Chief Complaint  Patient presents with   Follow-up    SUBJECTIVE    Chief Complaint  Patient presents with   Follow-up   HPI   Diet: improved  Exercise: limited with her dog but does walk around the house.   Screenings: Colon Cancer screenings (start at age 1): recent colonoscopy done 09/2021 with recommendation in two years; due in 2024 Pap smears: hx of hysterectomy Mammograms: mammogram ordered at last visit  Family hx: HTN: mom  DM: none Cancer: sister breast cancer; dad had colon cancer Stage 3   Social hx: Alcohol use: no  Tobacco (chew, smoke): no, never  Who do you live with: husband and dogs   Pt is also needing a refill on her reflux medication and nausea medication.  She is having a dental procedure root canal and says her dentist won't prescribe it for her because she has a hx of tachycardia. She is very well controlled.   Review of Systems  Constitutional:  Negative for activity change, fatigue and fever.  Respiratory:  Negative for cough and shortness of breath.   Cardiovascular:  Negative for chest pain.  Gastrointestinal:  Negative for abdominal pain.  Genitourinary:  Negative for difficulty urinating.       Current Meds  Medication Sig   Ascorbic Acid (VITAMIN C) 100 MG tablet Take 100 mg by mouth daily.   BIOTIN PO Take 1 tablet by mouth daily.   ELDERBERRY PO Take 1 tablet by mouth daily.   HYDROcodone-acetaminophen (NORCO) 5-325 MG tablet Take 1 tablet by mouth 2 (two) times daily as needed for up to 5 days for moderate pain.   Krill Oil 1000 MG CAPS Take 350 mg by mouth.    metoprolol succinate (TOPROL-XL) 25 MG 24 hr tablet Take 1 tablet (25 mg total) by mouth daily. With or immediately following a meal.   nitroGLYCERIN (NITROSTAT) 0.4 MG SL  tablet Place 1 tablet (0.4 mg total) under the tongue every 5 (five) minutes as needed for chest pain.   [DISCONTINUED] acetaminophen (TYLENOL) 500 MG tablet Take 500 mg by mouth every 6 (six) hours as needed for mild pain or headache.   [DISCONTINUED] omeprazole (PRILOSEC) 20 MG capsule TAKE ONE CAPSULE BY MOUTH ONE TIME DAILY **MUST CALL DR. FOR APPOINTMENT**   [DISCONTINUED] promethazine (PHENERGAN) 12.5 MG tablet Take 1 tablet (12.5 mg total) by mouth every 6 (six) hours as needed for nausea or vomiting.    OBJECTIVE    BP 118/74   Pulse 68   Ht '5\' 5"'$  (1.651 m)   Wt 123 lb (55.8 kg)   SpO2 100%   BMI 20.47 kg/m   Physical Exam Vitals and nursing note reviewed.  Constitutional:      General: She is not in acute distress.    Appearance: Normal appearance.  HENT:     Head: Normocephalic and atraumatic.     Right Ear: External ear normal.     Left Ear: External ear normal.     Nose: Nose normal.  Eyes:     Conjunctiva/sclera: Conjunctivae normal.  Cardiovascular:     Rate and Rhythm: Normal rate and regular rhythm.  Pulmonary:     Effort: Pulmonary effort is normal.     Breath sounds: Normal breath sounds.  Neurological:  General: No focal deficit present.     Mental Status: She is alert and oriented to person, place, and time.  Psychiatric:        Mood and Affect: Mood normal.        Behavior: Behavior normal.        Thought Content: Thought content normal.        Judgment: Judgment normal.     Comments: Overwhelmed with her dog, sad, tearful          ASSESSMENT & PLAN    Problem List Items Addressed This Visit       Digestive   Acid reflux    - have refilled omeprazole      Relevant Medications   omeprazole (PRILOSEC) 20 MG capsule     Other   Routine adult health maintenance - Primary    - doing well  - discussed blood work--all normal       Other dental procedure status    - pt having root canal procedure and dentist will not give pain  medication. Have sent in 5 days of norco for pain control  - vitals stable      Relevant Medications   HYDROcodone-acetaminophen (NORCO) 5-325 MG tablet   Nausea    Have sent refill for nausea      Relevant Medications   promethazine (PHENERGAN) 12.5 MG tablet   Abnormal mammogram   Relevant Orders   Ambulatory referral to Breast Clinic   Pt dealing with a lot and her dog is currently dying. Did offer to give her a few days course of xanax to help her during this difficult time. Pt said she is ok right now and will let us know if she needs anything.   Return in about 6 months (around 03/31/2023).      Meds ordered this encounter  Medications   HYDROcodone-acetaminophen (NORCO) 5-325 MG tablet    Sig: Take 1 tablet by mouth 2 (two) times daily as needed for up to 5 days for moderate pain.    Dispense:  10 tablet    Refill:  0   promethazine (PHENERGAN) 12.5 MG tablet    Sig: Take 1 tablet (12.5 mg total) by mouth every 6 (six) hours as needed for nausea or vomiting.    Dispense:  30 tablet    Refill:  0   omeprazole (PRILOSEC) 20 MG capsule    Sig: Take 1 capsule (20 mg total) by mouth daily.    Dispense:  90 capsule    Refill:  0    Orders Placed This Encounter  Procedures   Ambulatory referral to Breast Clinic    Referral Priority:   Routine    Referral Type:   Consultation    Referral Reason:   Specialty Services Required    Requested Specialty:   Breast Surgery    Number of Visits Requested:   [facility], [place] [phone] (phone) [phone] (fax)  [place]

## 2022-09-30 NOTE — Addendum Note (Signed)
Addended by: Owens Loffler on: 09/30/2022 04:06 PM   Modules accepted: Level of Service

## 2022-09-30 NOTE — Assessment & Plan Note (Signed)
-   doing well  - discussed blood work--all normal

## 2022-09-30 NOTE — Assessment & Plan Note (Signed)
-   have refilled omeprazole

## 2022-09-30 NOTE — Addendum Note (Signed)
Addended by: Owens Loffler on: 09/30/2022 04:02 PM   Modules accepted: Orders

## 2022-09-30 NOTE — Assessment & Plan Note (Signed)
-   pt having root canal procedure and dentist will not give pain medication. Have sent in 5 days of norco for pain control  - vitals stable

## 2022-10-17 NOTE — Telephone Encounter (Signed)
error 

## 2022-10-22 ENCOUNTER — Other Ambulatory Visit: Payer: Self-pay | Admitting: Family Medicine

## 2022-11-17 ENCOUNTER — Other Ambulatory Visit: Payer: Self-pay | Admitting: Interventional Cardiology

## 2022-11-25 DIAGNOSIS — N6310 Unspecified lump in the right breast, unspecified quadrant: Secondary | ICD-10-CM | POA: Diagnosis not present

## 2022-11-26 ENCOUNTER — Emergency Department (HOSPITAL_BASED_OUTPATIENT_CLINIC_OR_DEPARTMENT_OTHER): Payer: Medicare Other

## 2022-11-26 ENCOUNTER — Other Ambulatory Visit: Payer: Self-pay

## 2022-11-26 ENCOUNTER — Emergency Department (HOSPITAL_BASED_OUTPATIENT_CLINIC_OR_DEPARTMENT_OTHER)
Admission: EM | Admit: 2022-11-26 | Discharge: 2022-11-26 | Disposition: A | Discharge status: Home / Self Care | Payer: Medicare Other | Attending: Emergency Medicine | Admitting: Emergency Medicine

## 2022-11-26 ENCOUNTER — Ambulatory Visit: Payer: Medicare Other | Admitting: General Practice

## 2022-11-26 ENCOUNTER — Telehealth: Payer: Self-pay | Admitting: Interventional Cardiology

## 2022-11-26 ENCOUNTER — Encounter (HOSPITAL_BASED_OUTPATIENT_CLINIC_OR_DEPARTMENT_OTHER): Payer: Self-pay | Admitting: Emergency Medicine

## 2022-11-26 DIAGNOSIS — R42 Dizziness and giddiness: Secondary | ICD-10-CM | POA: Diagnosis not present

## 2022-11-26 DIAGNOSIS — F419 Anxiety disorder, unspecified: Secondary | ICD-10-CM | POA: Insufficient documentation

## 2022-11-26 DIAGNOSIS — R079 Chest pain, unspecified: Secondary | ICD-10-CM | POA: Diagnosis not present

## 2022-11-26 DIAGNOSIS — K219 Gastro-esophageal reflux disease without esophagitis: Secondary | ICD-10-CM | POA: Insufficient documentation

## 2022-11-26 LAB — BASIC METABOLIC PANEL
Anion gap: 12 (ref 5–15)
BUN: 11 mg/dL (ref 6–20)
CO2: 24 mmol/L (ref 22–32)
Calcium: 9.2 mg/dL (ref 8.9–10.3)
Chloride: 106 mmol/L (ref 98–111)
Creatinine, Ser: 0.72 mg/dL (ref 0.44–1.00)
GFR, Estimated: >60 mL/min (ref >60)
Glucose, Bld: 91 mg/dL (ref 70–99)
Potassium: 3.8 mmol/L (ref 3.5–5.1)
Sodium: 142 mmol/L (ref 135–145)

## 2022-11-26 LAB — CBC
HCT: 40.1 % (ref 36.0–46.0)
Hemoglobin: 13.4 g/dL (ref 12.0–15.0)
MCH: 30.2 pg (ref 26.0–34.0)
MCHC: 33.4 g/dL (ref 30.0–36.0)
MCV: 90.5 fL (ref 80.0–100.0)
Platelets: 280 10*3/uL (ref 150–400)
RBC: 4.43 MIL/uL (ref 3.87–5.11)
RDW: 12.4 % (ref 11.5–15.5)
WBC: 7.7 10*3/uL (ref 4.0–10.5)
nRBC: 0 % (ref 0.0–0.2)

## 2022-11-26 LAB — TROPONIN I (HIGH SENSITIVITY)
Troponin I (High Sensitivity): <2 ng/L (ref <18)
Troponin I (High Sensitivity): <2 ng/L (ref <18)

## 2022-11-26 NOTE — Telephone Encounter (Signed)
STAT if HR is under 50 or over 120 (normal HR is 60-100 beats per minute)  What is your heart rate? Patient states  lowest 30 and highest 160. But it goes up and down  Do you have a log of your heart rate readings (document readings)?    Do you have any other symptoms? Lightheaded, dizziness, pressure in chest, tiredness

## 2022-11-26 NOTE — ED Triage Notes (Signed)
Pt reports dizziness since December; c/o chest pressure and lack of energy x few weeks intermittently

## 2022-11-26 NOTE — ED Provider Notes (Signed)
[facility]PART[facility] Provider Note   CSN: [id number] Arrival date & time: 11/26/22  1539     History  Chief Complaint  Patient presents with   Dizziness   Chest Pain    Diane Gutierrez is a 57 y.o. female.  The history is provided by the patient, the spouse and medical records. No language interpreter was used.  Dizziness Associated symptoms: chest pain   Chest Pain Associated symptoms: dizziness      57 year old female with significant history of anxiety, PVC, acid reflux presenting today for evaluation of dizziness.  Patient reports for the past month she has had intermittent bouts of chest discomfort and dizziness.  She described the dizziness more as a lightheadedness sensation feeling spaced out and sometimes worse when she changing position.  She also noticed that her heart rate has been fluctuating sometimes dropping down to the 30s which concerns her.  She endorsed occasional pressure in her chest that radiates towards her jaw with most recent episode earlier today.  She reached out to her cardiologist and was told to come to the ER for further assessment.  She does not endorse any fever or chills no room spinning sensation no runny nose sneezing or coughing no nausea or diaphoresis.  No recent medication changes.  No prior history of PE or DVT.  Home Medications Prior to Admission medications   Medication Sig Start Date End Date Taking? Authorizing Provider  Ascorbic Acid (VITAMIN C) 100 MG tablet Take 100 mg by mouth daily.    [provider]  BIOTIN PO Take 1 tablet by mouth daily.    [provider]  ELDERBERRY PO Take 1 tablet by mouth daily.    [provider]  [name] Oil 1000 MG CAPS Take 350 mg by mouth.     [provider]  metoprolol succinate (TOPROL-XL) 25 MG 24 hr tablet TAKE ONE TABLET BY MOUTH ONE TIME DAILY WITH OR IMMEDIATELY FOLLOWING A MEAL 11/17/22   Jettie Booze, MD  nitroGLYCERIN  (NITROSTAT) 0.4 MG SL tablet PLACE ONE TABLET UNDER THE TONGUE AT ONSET OF CHEST PAIN. MAY REPEAT EVERY 5 MINUTES AS NEEDED FOR CHEST PAIN UP TO 3 TABLETS IN 15 MINUTES. IF NO RELIEF AFTER 5 MINUTES, CALL 911. 10/22/22   Luetta Nutting, DO  omeprazole (PRILOSEC) 20 MG capsule Take 1 capsule (20 mg total) by mouth daily. 09/30/22   Owens Loffler, DO  promethazine (PHENERGAN) 12.5 MG tablet Take 1 tablet (12.5 mg total) by mouth every 6 (six) hours as needed for nausea or vomiting. 09/30/22   Owens Loffler, DO      Allergies    Prednisone and Ibuprofen    Review of Systems   Review of Systems  Cardiovascular:  Positive for chest pain.  Neurological:  Positive for dizziness.  All other systems reviewed and are negative.   Physical Exam Updated Vital Signs BP 120/71 (BP Location: Left Arm)   Pulse 65   Temp 98.4 F (36.9 C) (Oral)   Resp (!) 21   Ht '5\' 4"'$  (1.626 m)   Wt 52.6 kg   SpO2 100%   BMI 19.91 kg/m  Physical Exam Vitals and nursing note reviewed.  Constitutional:      General: She is not in acute distress.    Appearance: She is well-developed.  HENT:     Head: Atraumatic.  Eyes:     Extraocular Movements: Extraocular movements intact.     Conjunctiva/sclera: Conjunctivae normal.  Pupils: Pupils are equal, round, and reactive to light.  Neck:     Vascular: No carotid bruit.  Cardiovascular:     Rate and Rhythm: Normal rate and regular rhythm.     Pulses: Normal pulses.     Heart sounds: Normal heart sounds.  Pulmonary:     Effort: Pulmonary effort is normal.  Abdominal:     Palpations: Abdomen is soft.     Tenderness: There is no abdominal tenderness.  Musculoskeletal:     Cervical back: Normal range of motion and neck supple. No rigidity or tenderness.     Right lower leg: No edema.     Left lower leg: No edema.  Lymphadenopathy:     Cervical: No cervical adenopathy.  Skin:    Findings: No rash.  Neurological:     Mental Status: She is alert.   Psychiatric:        Mood and Affect: Mood normal.     ED Results / Procedures / Treatments   Labs (all labs ordered are listed, but only abnormal results are displayed) Labs Reviewed  BASIC METABOLIC PANEL  CBC  TSH  TROPONIN I (HIGH SENSITIVITY)  TROPONIN I (HIGH SENSITIVITY)    EKG EKG Interpretation  Date/Time:  Wednesday November 26 2022 15:53:39 EST Ventricular Rate:  80 PR Interval:  134 QRS Duration: 83 QT Interval:  372 QTC Calculation: 430 R Axis:   58 Text Interpretation: Sinus rhythm No significant change since last tracing Confirmed by Wandra Arthurs [id number]) on 11/26/2022 5:18:40 PM  Radiology DG Chest 2 View  Result Date: 11/26/2022 CLINICAL DATA:  Dizziness and chest pain EXAM: CHEST - 2 VIEW COMPARISON:  06/07/2021 FINDINGS: Normal heart size and pulmonary vascularity. No focal airspace disease or consolidation in the lungs. No blunting of costophrenic angles. No pneumothorax. Mediastinal contours appear intact. Surgical clips in the right upper quadrant. Postoperative changes in the cervical spine. IMPRESSION: No active cardiopulmonary disease. Electronically Signed   By: Lucienne Capers M.D.   On: 11/26/2022 16:26    Procedures Procedures    Medications Ordered in ED Medications - No data to display  ED Course/ Medical Decision Making/ A&P                             Medical Decision Making Amount and/or Complexity of Data Reviewed Labs: ordered. Radiology: ordered.   BP 120/71 (BP Location: Left Arm)   Pulse 65   Temp 98.4 F (36.9 C) (Oral)   Resp (!) 21   Ht '5\' 4"'$  (1.626 m)   Wt 52.6 kg   SpO2 100%   BMI 19.91 kg/m   34:60 PM 57 year old female with significant history of anxiety, PVC, acid reflux presenting today for evaluation of dizziness.  Patient reports for the past month she has had intermittent bouts of chest discomfort and dizziness.  She described the dizziness more as a lightheadedness sensation feeling spaced out and  sometimes worse when she changing position.  She also noticed that her heart rate has been fluctuating sometimes dropping down to the 30s which concerns her.  She endorsed occasional pressure in her chest that radiates towards her jaw with most recent episode earlier today.  She reached out to her cardiologist and was told to come to the ER for further assessment.  She does not endorse any fever or chills no room spinning sensation no runny nose sneezing or coughing no nausea or diaphoresis.  No recent  medication changes.  No prior history of PE or DVT.  On exam, patient is sitting in the bed in no acute discomfort.  Palpation of her neck is without any carotid bruit or pulsatile mass.  No thyromegaly.  Heart of normal rate and rhythm, lungs are clear to auscultation, abdomen soft nontender, radial pulse 2+ bilaterally.  Vital signs reviewed and are overall reassuring, normal blood pressure, she is afebrile, no hypoxia, normal heart rate.  -Labs ordered, independently viewed and interpreted by me.  Labs remarkable for normal electrolytes panel, normal WBC, normal H&H, normal initial troponin -The patient was maintained on a cardiac monitor.  I personally viewed and interpreted the cardiac monitored which showed an underlying rhythm of: NSR -Imaging independently viewed and interpreted by me and I agree with radiologist's interpretation.  Result remarkable for CXR unremarkable -This patient presents to the ED for concern of dizzy, this involves an extensive number of treatment options, and is a complaint that carries with it a high risk of complications and morbidity.  The differential diagnosis includes near syncope, cardiac arrhythmia, PE, anemia, acs, dissection, carotid occlusion -Co morbidities that complicate the patient evaluation includes anxiety -Treatment includes none -Reevaluation of the patient after these medicines showed that the patient stayed the same -PCP office notes or outside notes  reviewed -Escalation to admission/observation considered: patients feels much better, is comfortable with discharge, and will follow up with PCP -Prescription medication considered, patient comfortable with OTC meds -Social Determinant of Health considered   6:26 PM Negative Delta trop; doubt ACS.  TSH has not resulted yet but patient can follow-up on the result through [facility].  Encourage patient to follow-up closely with her cardiologist for further care.  Return precaution given.          Final Clinical Impression(s) / ED Diagnoses Final diagnoses:  Dizziness    Rx / DC Orders ED Discharge Orders          Ordered    Ambulatory referral to Cardiology       Comments: If you have not heard from the Cardiology office within the next 72 hours please call [phone].   11/26/22 1834              Domenic Moras, PA-C 11/26/22 1834    Drenda Freeze, MD 11/26/22 2255

## 2022-11-26 NOTE — Discharge Instructions (Signed)
You have been evaluated for your symptoms.  Fortunately your blood work today did not show any concerning finding.  Your EKG looks normal.  Your thyroid function panel have been obtained but have not resulted yet.  You can check the result through [facility].  Call and follow-up closely with your cardiologist for outpatient evaluation.  Return if you have any concern.

## 2022-11-26 NOTE — Telephone Encounter (Signed)
Pt called in to report SOB, dizziness and HR fluctuations. Pt has felt funny since Dec 2023 with.  Has recently noted HR ranging lowest 30 and highest 160's.   Last BP 1/16- 117/74 Current HR 60-90's.   Pt takes Toprol-XL 25 mg PO QD as ordered has not missed any doses.  C/O left jaw pain; does not know if pain is r/t previous neck surgery.  Also, having left shoulder pain and gets sweaty at times. Thinks sweatiness could be r/t hormones.  Also, has a funny feeling in chest with activity. Advised pt will cancel OV for today and pt is to report to the ED for urgent evaluation.  Pt reports will go to [facility]  Advised pt that if symptoms are heart r/t will be transferred to [facility] main campus.  Pt expresses understanding but will present to [place]  Pt has NTG if needed.

## 2022-11-26 NOTE — ED Notes (Signed)
ED Provider at bedside. 

## 2022-11-27 LAB — TSH: TSH: 0.946 u[IU]/mL (ref 0.350–4.500)

## 2022-12-08 NOTE — Progress Notes (Unsigned)
Cardiology Office Note:    Date:  12/09/2022   ID:  Diane Gutierrez, DOB [birth year], MRN [id number]  PCP:  Owens Loffler, DO  [facility] Cardiologist:  Larae Grooms, MD    Referring MD: Owens Loffler, DO    Patient Profile: Palpitations  Monitor 04/18/2021: NSR (HR 44-145), rare PACs, PVCs TTE 03/25/2018 ([place]): EF 55-60, mild MR, mild TR, RVSP 21 TTE 08/28/2021: EF 60-65, no RWMA, normal RVSF, mild MR, RAP 3 FHx of CAD CCTA 05/15/2020: CAC score 0, no CAD GERD  Hyperlipidemia  Anxiety  Ulcerative colitis     History of Present Illness:   Diane Gutierrez is a 57 y.o. female with the above problem list.  She was last seen by Dr. Irish Lack 09/01/22.  She was seen in the emergency room 11/26/2022 with symptoms of chest pain and dizziness.  She had recorded fluctuation in heart rates, sometimes dropping down into the 30s.  She had contacted our office prior to going the emergency room and noted heart rates as high as 160s.  Her hsTrops were neg x 2 (personally reviewed and interpreted). CXR was normal. EKG was also personally reviewed and interpreted and demonstrated NSR, HR 80, normal axis, nonspecific ST-T wave changes, QTc 430.   Diane Gutierrez returns for further evaluation of dizziness and chest pain. She is here with her husband. She has been under a lot of stress the last couple of months. Her dog was very sick and ultimately passed away. She has noted chest pain with increased stress as well as with exertion. She has not tried NTG recently but did take it about a year ago with relief. She notes decreased exercise tolerance, shortness of breath with exertion. She sleeps on an incline due to prior neck surgery. She has not had leg edema. She has noted HRs into the 30s on her smart watch which coincided with feeling tired. She has noted symptoms of near syncope but has not had frank syncope.      Reviewed and updated this encounter:   Tobacco   Allergies  Meds  Problems  Med Hx  Surg Hx  Fam Hx     Review of Systems  Constitutional: Negative for fever.  Respiratory:  Negative for cough.   Gastrointestinal:  Negative for melena.  Genitourinary:  Negative for hematuria.    Labs/Other Test Reviewed:   Recent Labs: 09/25/2022: ALT 17 11/26/2022: BUN 11; Creatinine, Ser 0.72; Hemoglobin 13.4; Platelets 280; Potassium 3.8; Sodium 142; TSH 0.946   Recent Lipid Panel Recent Labs    09/25/22 0000  CHOL 206*  TRIG 124  HDL 52  LDLCALC 131*     Risk Assessment/Calculations/Metrics:             Physical Exam:   VS:  BP 116/80   Pulse 83   Ht '5\' 4"'$  (1.626 m)   Wt 118 lb 12.8 oz (53.9 kg)   SpO2 99%   BMI 20.39 kg/m    Wt Readings from Last 3 Encounters:  12/09/22 118 lb 12.8 oz (53.9 kg)  11/26/22 116 lb (52.6 kg)  09/30/22 123 lb (55.8 kg)    Constitutional:      Appearance: Healthy appearance. Not in distress.  Neck:     Vascular: No carotid bruit. JVD normal.  Pulmonary:     Effort: Pulmonary effort is normal.     Breath sounds: No wheezing. No rales.  Cardiovascular:     Normal rate. Regular rhythm.  Normal S1. Normal S2.      Murmurs: There is no murmur.  Edema:    Peripheral edema absent.  Abdominal:     Palpations: Abdomen is soft.          ASSESSMENT & PLAN:   Near syncope She has recorded HRs in the 30s with her smart watch. She has not recorded any tele tracings. She is on Toprol for treatment of palpitations. We discussed decreasing this to 12.5 mg once daily, but she is hesitant to do this. She has noted decreased ex tolerance, dyspnea on exertion and near syncope. Recent TSH was normal.  Obtain echocardiogram to r/o structural heart disease Obtain exercise Myoview to rule out chronotropic incompetence Obtain 30 day event monitor If she has significant pauses or bradycardia on the monitor, we will have to DC the Toprol XL  F/u 2-3 mos   Precordial pain She had a normal CCTA in 2021. She  does have a strong FHx of CAD. She has had NTG responsive chest pain in the past, although this was many months ago. She had recent trip to the ED with chest pain and her EKG was without acute changes and her hsTrops were neg. As she notes dyspnea on exertion, exertional chest pain and decreased exercise tolerance, will proceed with an exercise Myoview to assess for chrono incompetence as well as rule out the possibility of ischemia. Continue NTG prn chest pain.   Preoperative cardiovascular examination She notes that she will need a tooth extracted soon. She is not sure but thinks they may be trying to set her up for gen anesthesia. If that is the case, I have recommended that she hold off on her dental extraction until her workup is complete. If she just needs local anesthetic, valium or nitrous oxide, she should be able to proceed before her tests are complete.         Shared Decision Making/Informed Consent The risks [chest pain, shortness of breath, cardiac arrhythmias, dizziness, blood pressure fluctuations, myocardial infarction, stroke/transient ischemic attack, nausea, vomiting, allergic reaction, radiation exposure, metallic taste sensation and life-threatening complications (estimated to be 1 in 10,000)], benefits (risk stratification, diagnosing coronary artery disease, treatment guidance) and alternatives of a nuclear stress test were discussed in detail with Diane Gutierrez and she agrees to proceed.    Dispo:  Return in about 3 months (around 03/10/2023) for Follow up after testing w/ Dr. Irish Lack, or Richardson Dopp, PA-C.   Signed, Richardson Dopp, PA-C  12/09/2022 10:25 AM    [facility] [place], [place], [place]  [address] Phone: [phone]; Fax: [phone]

## 2022-12-09 ENCOUNTER — Encounter: Payer: Self-pay | Admitting: Physician Assistant

## 2022-12-09 ENCOUNTER — Ambulatory Visit: Payer: Medicare Other | Attending: Physician Assistant | Admitting: Physician Assistant

## 2022-12-09 ENCOUNTER — Ambulatory Visit (INDEPENDENT_AMBULATORY_CARE_PROVIDER_SITE_OTHER): Payer: Medicare Other

## 2022-12-09 ENCOUNTER — Encounter: Payer: Self-pay | Admitting: *Deleted

## 2022-12-09 VITALS — BP 116/80 | HR 83 | Ht 64.0 in | Wt 118.8 lb

## 2022-12-09 DIAGNOSIS — R072 Precordial pain: Secondary | ICD-10-CM | POA: Diagnosis not present

## 2022-12-09 DIAGNOSIS — R55 Syncope and collapse: Secondary | ICD-10-CM | POA: Diagnosis not present

## 2022-12-09 DIAGNOSIS — R0602 Shortness of breath: Secondary | ICD-10-CM | POA: Diagnosis not present

## 2022-12-09 DIAGNOSIS — Z01810 Encounter for preprocedural cardiovascular examination: Secondary | ICD-10-CM | POA: Diagnosis not present

## 2022-12-09 DIAGNOSIS — R001 Bradycardia, unspecified: Secondary | ICD-10-CM

## 2022-12-09 NOTE — Patient Instructions (Signed)
Medication Instructions:  Your physician recommends that you continue on your current medications as directed. Please refer to the Current Medication list given to you today.  *If you need a refill on your cardiac medications before your next appointment, please call your pharmacy*   Lab Work: None If you have labs (blood work) drawn today and your tests are completely normal, you will receive your results only by: [place] (if you have MyChart) OR A paper copy in the mail If you have any lab test that is abnormal or we need to change your treatment, we will call you to review the results.   Testing/Procedures: Your physician has requested that you have an echocardiogram. Echocardiography is a painless test that uses sound waves to create images of your heart. It provides your doctor with information about the size and shape of your heart and how well your heart's chambers and valves are working. This procedure takes approximately one hour. There are no restrictions for this procedure. Please do NOT wear cologne, perfume, aftershave, or lotions (deodorant is allowed). Please arrive 15 minutes prior to your appointment time.    Your physician has requested that you have a lexiscan myoview. For further information please visit [email or web address]. Please follow instruction sheet, as given.   [employer] CARDIAC EVENT MONITOR INSTRUCTIONS   Your physician has requested you wear a cardiac event monitor for _30_ days. [employer] may call or text to confirm a shipping address. The monitor will be sent to a land address via UPS. [employer] will not ship a monitor to a PO BOX. It typically takes 3-5 days to receive your monitor after it has been enrolled. [employer] will assist with USPS tracking if your package is delayed. The telephone number for [employer] is   [phone].   Once you have received your monitor, please review the enclosed instructions. Instruction tutorials can also be  viewed under help and settings on the enclosed cell phone. Your monitor has already been registered assigning a specific monitor serial # to you.   Billing and Self Pay Discount Information  [employer] has been provided the insurance information we had on file for you.  If your insurance has been updated, please call [employer] at [phone] to provide them with your updated insurance information.   [employer] offers a discounted Self Pay option for patients who have insurance that does not cover their cardiac event monitor or patients without insurance.  The discounted cost of a Self Pay Cardiac Event Monitor would be $225.00 , if the patient contacts [employer] at [phone] within 7 days of applying the monitor to make payment arrangements.  If the patient does not contact [employer] within 7 days of applying the monitor, the cost of the cardiac event monitor will be $350.00.  Applying the monitor    Remove cell phone from case and turn it on. The cell phone works as Dealer and needs to be always within 10 feet of you. The cell phone will need to be charged daily. We recommend you plug the cell phone into the enclosed charger at your bedside table every night.   Monitor batteries: You will receive two monitor batteries labelled #1 and #2. These are your recorders. Plug battery #2 onto the second connection on the enclosed charger. Always keep one battery on the charger. This will keep the monitor battery deactivated. It will also keep it fully charged for when you need to switch your monitor batteries. A small light will blink on the battery emblem  when it is charging. The light on the battery emblem will remain on when the battery is fully charged.    Open package of a Monitor strip. Insert battery #1 into black hood on strip and gently squeeze monitor battery onto connection as indicated in instruction booklet. Set aside while preparing skin.   Choose the location for your strip,  vertical or horizontal, as indicated in the instruction booklet. Shave to remove all hair from location. There cannot be any lotions, oils, powders, or colognes on skin where monitor is to be applied. Wipe skin clean with enclosed Saline wipe. Dry skin completely.    Peel paper labelled #1 off the back of the Monitor strip exposing the adhesive. Place the monitor on the chest in the vertical or horizontal position shown in the instruction booklet. One arrow on the monitor strip must be pointing upward. Carefully remove paper labelled #2, attaching remainder of strip to your skin. Try not to create any folds or wrinkles in the strip as you apply it.    Firmly press and release the circle in the center of the monitor battery. You will hear a small beep. This is turning the monitor battery on. The heart emblem on the monitor battery will light up every 5 seconds if the monitor battery is turned on and connected to the patient securely. Do not push and hold the circle down as this turns the monitor battery off. The cell phone will locate the monitor battery. A screen will appear on the cell phone checking the connection of your monitor strip. This may read poor connection initially but change to good connection within the next minute. Once your monitor accepts the connection you will hear a series of 3 beeps followed by a climbing crescendo of beeps. A screen will appear on the cell phone showing the two monitor strip placement options. Touch the picture that demonstrates where you applied the monitor strip.    Your monitor strip and battery are waterproof. You will be able to shower, bathe, or swim with the monitor on. Please do not submerge deeper than 3 feet underwater. We recommend removing the monitor if you are swimming in a lake, river, or ocean.    Your monitor battery will need to be switched to a fully charged monitor battery approximately once a week. The cell phone will alert you of an action which  needs to be taken.    On the cell phone, tap for details to reveal connection status, monitor battery status, and cell phone battery status. A green dot will indicate your monitor is in good status, however, a red dot will indicate something that needs your attention.    To record a symptom, click the circle on the monitor battery. In 30-60 seconds, a list of symptoms will appear on the cell phone. Select your symptoms and tap save. Your monitor will record a sustained or significant arrhythmia regardless of you clicking the button. Some patients do not feel the heart rhythm irregularities. [employer] will notify us of any serious or critical events.   Refer to instruction booklet for instructions on switching batteries, changing strips, the Do not disturb or Pause features, or any additional questions.   Call [employer] at [phone], to confirm your monitor is transmitting and record your baseline. They will answer any questions you may have regarding the monitor instructions at that time.   Returning the monitor to [place] all equipment back into the blue box. Peel off strip  of paper to expose adhesive and close box securely. There is a prepaid UPS shipping label on this box. Drop in a UPS drop box, or at a UPS facility like [employer]. You may also contact [employer] to arrange UPS to pick up monitor package at your home.    Follow-Up: At [facility], you and your health needs are our priority.  As part of our continuing mission to provide you with exceptional heart care, we have created designated Provider Care Teams.  These Care Teams include your primary Cardiologist (physician) and Advanced Practice Providers (APPs -  Physician Assistants and Nurse Practitioners) who all work together to provide you with the care you need, when you need it.   Your next appointment:   2-3 month(s)  Provider:   Larae Grooms, MD  or Richardson Dopp, PA-C

## 2022-12-09 NOTE — Assessment & Plan Note (Signed)
She notes that she will need a tooth extracted soon. She is not sure but thinks they may be trying to set her up for gen anesthesia. If that is the case, I have recommended that she hold off on her dental extraction until her workup is complete. If she just needs local anesthetic, valium or nitrous oxide, she should be able to proceed before her tests are complete.

## 2022-12-09 NOTE — Assessment & Plan Note (Signed)
She had a normal CCTA in 2021. She does have a strong FHx of CAD. She has had NTG responsive chest pain in the past, although this was many months ago. She had recent trip to the ED with chest pain and her EKG was without acute changes and her hsTrops were neg. As she notes dyspnea on exertion, exertional chest pain and decreased exercise tolerance, will proceed with an exercise Myoview to assess for chrono incompetence as well as rule out the possibility of ischemia. Continue NTG prn chest pain.

## 2022-12-09 NOTE — Assessment & Plan Note (Signed)
She has recorded HRs in the 30s with her smart watch. She has not recorded any tele tracings. She is on Toprol for treatment of palpitations. We discussed decreasing this to 12.5 mg once daily, but she is hesitant to do this. She has noted decreased ex tolerance, dyspnea on exertion and near syncope. Recent TSH was normal.  Obtain echocardiogram to r/o structural heart disease Obtain exercise Myoview to rule out chronotropic incompetence Obtain 30 day event monitor If she has significant pauses or bradycardia on the monitor, we will have to DC the Toprol XL  F/u 2-3 mos

## 2022-12-09 NOTE — Progress Notes (Unsigned)
Given [id number] from office inventory.  Dr. Irish Lack to read.

## 2022-12-13 DIAGNOSIS — R55 Syncope and collapse: Secondary | ICD-10-CM

## 2022-12-13 DIAGNOSIS — R001 Bradycardia, unspecified: Secondary | ICD-10-CM

## 2022-12-22 ENCOUNTER — Telehealth: Payer: Self-pay | Admitting: *Deleted

## 2022-12-22 NOTE — Telephone Encounter (Addendum)
Patient having issues with [employer]/ [facility] serial # [id number].  She has worn it a week and had to change to battery #2.  Phone persistently read poor skin contact.  Skin is burning from pulling high adhesive strips off again and again to try to obtain good skin contact. Problem is probably with monitor #2 connection. Patient scheduled to come into office tomorrow to change out monitor kits and provide Hydrocolloid strips, Bridge and 34M Red Dot repositionable electrodes to avoid additional irritation.

## 2022-12-23 ENCOUNTER — Ambulatory Visit: Payer: Medicare Other | Attending: Cardiovascular Disease

## 2022-12-26 ENCOUNTER — Telehealth (HOSPITAL_COMMUNITY): Payer: Self-pay

## 2022-12-26 NOTE — Telephone Encounter (Signed)
Spoke with the patient, detailed instructions given. She stated that she would be here for his test. Asked to call back with any questions. S.Charnese Federici EMTP/CCT

## 2022-12-30 ENCOUNTER — Telehealth: Payer: Self-pay | Admitting: Interventional Cardiology

## 2022-12-30 ENCOUNTER — Ambulatory Visit (HOSPITAL_BASED_OUTPATIENT_CLINIC_OR_DEPARTMENT_OTHER): Payer: Medicare Other

## 2022-12-30 ENCOUNTER — Ambulatory Visit (HOSPITAL_COMMUNITY): Payer: Medicare Other | Attending: Cardiology

## 2022-12-30 DIAGNOSIS — R55 Syncope and collapse: Secondary | ICD-10-CM

## 2022-12-30 DIAGNOSIS — R072 Precordial pain: Secondary | ICD-10-CM

## 2022-12-30 DIAGNOSIS — R0602 Shortness of breath: Secondary | ICD-10-CM | POA: Insufficient documentation

## 2022-12-30 LAB — ECHOCARDIOGRAM COMPLETE
Area-P 1/2: 4.33 cm2
S' Lateral: 3.2 cm

## 2022-12-30 LAB — MYOCARDIAL PERFUSION IMAGING
Estimated workload: 7
Exercise duration (min): 5 min
Exercise duration (sec): 1 s
LV dias vol: 43 mL (ref 46–106)
LV sys vol: 13 mL
MPHR: 164 {beats}/min
Nuc Stress EF: 69 %
Peak HR: 162 {beats}/min
Percent HR: 98 %
Rest HR: 67 {beats}/min
Rest Nuclear Isotope Dose: 10.1 mCi
SDS: 0
SRS: 0
SSS: 0
ST Depression (mm): 0 mm
Stress Nuclear Isotope Dose: 31.6 mCi
TID: 0.9

## 2022-12-30 MED ORDER — TECHNETIUM TC 99M TETROFOSMIN IV KIT
10.1000 | PACK | Freq: Once | INTRAVENOUS | Status: AC | PRN
  Administered 2022-12-30: 10.1 via INTRAVENOUS

## 2022-12-30 MED ORDER — TECHNETIUM TC 99M TETROFOSMIN IV KIT
31.6000 | PACK | Freq: Once | INTRAVENOUS | Status: AC | PRN
  Administered 2022-12-30: 31.6 via INTRAVENOUS

## 2022-12-30 NOTE — Telephone Encounter (Signed)
Spoke with the patient and advised she can take her metoprolol

## 2022-12-30 NOTE — Telephone Encounter (Signed)
Pt c/o medication issue:  1. Name of Medication:  metoprolol succinate (TOPROL-XL) 25 MG 24 hr tablet   2. How are you currently taking this medication (dosage and times per day)?  TAKE ONE TABLET BY MOUTH ONE TIME DAILY WITH OR IMMEDIATELY FOLLOWING A MEAL   3. Are you having a reaction (difficulty breathing--STAT)?   4. What is your medication issue? Patient called stating she had an echo and a stress test done this morning, she wants to know if it's okay for her to take her metoprolol succinate now.

## 2022-12-31 ENCOUNTER — Encounter: Payer: Self-pay | Admitting: Physician Assistant

## 2023-01-05 NOTE — Progress Notes (Signed)
Pt has been made aware of normal result and verbalized understanding.  jw

## 2023-01-12 NOTE — Telephone Encounter (Signed)
[employer] had provided extension to 01/21/23 due to malfunction of first monitor, but the patient did wear the first monitor approximately a week before it malfunctioned. She removed monitor today and does have some skin irritation, and would prefer not to re-apply for just a couple days of data. Message sent to [employer] regarding early termination and to ask for them to start processing EOS report.

## 2023-01-21 ENCOUNTER — Encounter: Payer: Self-pay | Admitting: Physician Assistant

## 2023-02-24 ENCOUNTER — Ambulatory Visit: Payer: Medicare Other | Attending: Physician Assistant | Admitting: Physician Assistant

## 2023-02-24 ENCOUNTER — Encounter: Payer: Self-pay | Admitting: Physician Assistant

## 2023-02-24 VITALS — BP 101/70 | HR 70 | Ht 65.0 in | Wt 119.0 lb

## 2023-02-24 DIAGNOSIS — R55 Syncope and collapse: Secondary | ICD-10-CM | POA: Diagnosis not present

## 2023-02-24 DIAGNOSIS — E782 Mixed hyperlipidemia: Secondary | ICD-10-CM

## 2023-02-24 DIAGNOSIS — R072 Precordial pain: Secondary | ICD-10-CM

## 2023-02-24 DIAGNOSIS — Z01810 Encounter for preprocedural cardiovascular examination: Secondary | ICD-10-CM

## 2023-02-24 NOTE — Patient Instructions (Signed)
Medication Instructions:  Your physician recommends that you continue on your current medications as directed. Please refer to the Current Medication list given to you today.  *If you need a refill on your cardiac medications before your next appointment, please call your pharmacy*   Lab Work: None ordered  If you have labs (blood work) drawn today and your tests are completely normal, you will receive your results only by: MyChart Message (if you have MyChart) OR A paper copy in the mail If you have any lab test that is abnormal or we need to change your treatment, we will call you to review the results.   Testing/Procedures: None ordered   Follow-Up: At [facility], you and your health needs are our priority.  As part of our continuing mission to provide you with exceptional heart care, we have created designated Provider Care Teams.  These Care Teams include your primary Cardiologist (physician) and Advanced Practice Providers (APPs -  Physician Assistants and Nurse Practitioners) who all work together to provide you with the care you need, when you need it.  We recommend signing up for the patient portal called "MyChart".  Sign up information is provided on this After Visit Summary.  MyChart is used to connect with patients for Virtual Visits (Telemedicine).  Patients are able to view lab/test results, encounter notes, upcoming appointments, etc.  Non-urgent messages can be sent to your provider as well.   To learn more about what you can do with MyChart, go to [email or web address].    Your next appointment:   1 year(s)  Provider:   Jayadeep Varanasi, MD     Other Instructions   

## 2023-02-24 NOTE — Assessment & Plan Note (Addendum)
Diane Gutierrez needs a dental procedure in the near future. Her perioperative risk of a major cardiac event is 0.4% according to the Revised Cardiac Risk Index (RCRI).  Therefore, she is at low risk for perioperative complications.    Recommendations: According to ACC/AHA guidelines, no further cardiovascular testing needed.  The patient may proceed to surgery at acceptable risk.

## 2023-02-24 NOTE — Progress Notes (Addendum)
Cardiology Office Note:    Date:  02/24/2023  ID:  Diane Gutierrez, DOB 1966/05/09, MRN [id number] PCP: Diane Amor, DO  [facility] Providers Cardiologist:  Diane Muss, MD      Patient Profile:   Palpitations  Monitor 04/18/2021: NSR (HR 44-145), rare PACs, PVCs TTE 03/25/2018 ([facility]): EF 55-60, mild MR, mild TR, RVSP 21 TTE 08/28/2021: EF 60-65, no RWMA, normal RVSF, mild MR, RAP 3 TTE 12/30/2022: EF 55-60, no RWMA, normal RVSF, normal PASP, RAP 3 Monitor 01/2023: Rare PACs, rare PVCs, no sustained arrhythmias FHx of CAD CCTA 05/15/2020: CAC score 0, no CAD Exercise Myoview 12/30/2022: Max heart rate 162, no ischemia or infarction, EF 69, low risk GERD  Hyperlipidemia  Anxiety  Ulcerative colitis     History of Present Illness:   Diane Gutierrez is a 57 y.o. female who returns for follow-up on palpitations, chest pain.  She was last seen 12/09/2022.  Echocardiogram, stress test and event monitor were arranged to evaluate her symptoms.  Echocardiogram demonstrated normal LV function.  Exercise Myoview demonstrated no ischemia or infarction.  She had appropriate increase in heart rate with exercise.  She had no sustained arrhythmias on her monitor.  Her lowest recorded heart rate was 47.  Average heart rate was 75.  She had no significant pauses. She is here with her husband. She continues to have occasional abdominal pain with radiation to her chest. She has not had syncope.   ROS see HPI    Studies Reviewed:    EKG:  not done   Risk Assessment/Calculations:             Physical Exam:   VS:  BP 101/70   Pulse 70   Ht  (1.651 m)   Wt 119 lb (54 kg)   SpO2 99%   BMI 19.80 kg/m    Wt Readings from Last 3 Encounters:  02/24/23 119 lb (54 kg)  12/30/22 118 lb (53.5 kg)  12/09/22 118 lb 12.8 oz (53.9 kg)    Constitutional:      Appearance: Healthy appearance. Not in distress.  Neck:     Vascular: No carotid bruit.  Pulmonary:      Breath sounds: Normal breath sounds. No wheezing. No rales.  Cardiovascular:     Normal rate. Regular rhythm.     Murmurs: There is no murmur.  Edema:    Peripheral edema absent.       ASSESSMENT AND PLAN:   Precordial pain CAC score of 0 and no CAD on CCTA in 2021. Recent Myoview low risk. No further CV testing needed.   Near syncope We reviewed her recent event monitor. There were no bradyarrhythmias or pathologic pauses. Her echocardiogram showed normal EF. She may be having vagal episodes in the setting of abdominal pain. No further CV testing needed.  Preoperative cardiovascular examination Ms. Talton needs a dental procedure in the near future. Her perioperative risk of a major cardiac event is 0.4% according to the Revised Cardiac Risk Index (RCRI).  Therefore, she is at low risk for perioperative complications.    Recommendations: According to ACC/AHA guidelines, no further cardiovascular testing needed.  The patient may proceed to surgery at acceptable risk.    Hyperlipidemia LDL 131 in 09/2022. Her overall CV risk is low. She does not require lipid Rx at this time. Continue diet, exercise.          Dispo:  Return in about 1 year (around 02/24/2024) for  Routine Follow Up w/ Dr. Eldridge Gutierrez.  Signed, Diane Newcomer, PA-C

## 2023-02-24 NOTE — Assessment & Plan Note (Signed)
We reviewed her recent event monitor. There were no bradyarrhythmias or pathologic pauses. Her echocardiogram showed normal EF. She may be having vagal episodes in the setting of abdominal pain. No further CV testing needed.

## 2023-02-24 NOTE — Assessment & Plan Note (Signed)
CAC score of 0 and no CAD on CCTA in 2021. Recent Myoview low risk. No further CV testing needed.

## 2023-02-24 NOTE — Assessment & Plan Note (Signed)
LDL 131 in 09/2022. Her overall CV risk is low. She does not require lipid Rx at this time. Continue diet, exercise.

## 2023-03-10 ENCOUNTER — Other Ambulatory Visit (HOSPITAL_BASED_OUTPATIENT_CLINIC_OR_DEPARTMENT_OTHER): Payer: Self-pay

## 2023-03-10 MED FILL — Metoprolol Succinate Tab ER 24HR 25 MG (Tartrate Equiv): ORAL | 90 days supply | Qty: 90 | Fill #0 | Status: AC

## 2023-03-23 ENCOUNTER — Telehealth: Payer: Self-pay | Admitting: Gastroenterology

## 2023-03-23 NOTE — Telephone Encounter (Signed)
Hi Dr. Chales Abrahams,   Patient called requesting a transfer of care over to Dr. Rhea Belton. She last had a endoscopy with you on 01/04/2020. She then went to [facility], [facility], and [facility]. She was not very happy with care that was provided with the previous providers. She is now wishing to transfer her care over to Dr. Rhea Belton. Would you approve this transfer?  Thank you.

## 2023-03-31 ENCOUNTER — Encounter: Payer: Self-pay | Admitting: Family Medicine

## 2023-03-31 ENCOUNTER — Other Ambulatory Visit (HOSPITAL_BASED_OUTPATIENT_CLINIC_OR_DEPARTMENT_OTHER): Payer: Self-pay

## 2023-03-31 ENCOUNTER — Ambulatory Visit (INDEPENDENT_AMBULATORY_CARE_PROVIDER_SITE_OTHER): Payer: Medicare Other | Admitting: Family Medicine

## 2023-03-31 VITALS — BP 108/72 | HR 60 | Ht 65.0 in | Wt 118.2 lb

## 2023-03-31 DIAGNOSIS — K219 Gastro-esophageal reflux disease without esophagitis: Secondary | ICD-10-CM

## 2023-03-31 DIAGNOSIS — M6250 Muscle wasting and atrophy, not elsewhere classified, unspecified site: Secondary | ICD-10-CM

## 2023-03-31 DIAGNOSIS — R110 Nausea: Secondary | ICD-10-CM

## 2023-03-31 MED ORDER — PROMETHAZINE HCL 12.5 MG PO TABS
12.5000 mg | ORAL_TABLET | Freq: Four times a day (QID) | ORAL | 0 refills | Status: DC | PRN
Start: 2023-03-31 — End: 2024-02-23
  Filled 2023-03-31: qty 30, 8d supply, fill #0

## 2023-03-31 MED ORDER — OMEPRAZOLE 20 MG PO CPDR
20.0000 mg | DELAYED_RELEASE_CAPSULE | Freq: Every day | ORAL | 0 refills | Status: DC
Start: 2023-03-31 — End: 2023-10-01
  Filled 2023-03-31: qty 90, 90d supply, fill #0

## 2023-03-31 MED ORDER — FLUTICASONE PROPIONATE 50 MCG/ACT NA SUSP
2.0000 | Freq: Every day | NASAL | 3 refills | Status: DC
  Filled 2023-03-31: qty 16, 30d supply, fill #0

## 2023-03-31 NOTE — Assessment & Plan Note (Signed)
-   will send refill of phenergan  - also wonder if her nausea is related to IBS like symptoms

## 2023-03-31 NOTE — Assessment & Plan Note (Signed)
-   pt has multiple characteristics concerning for charcot marie tooth - will send referral to neurology for further investigation and assessment.

## 2023-03-31 NOTE — Assessment & Plan Note (Signed)
-   refill omeprazole - if she is taking this consistently would like to get blood work at next visit

## 2023-03-31 NOTE — Progress Notes (Signed)
Established patient visit   Patient: Diane Gutierrez   DOB: 05-Jul-1966   57 y.o. Female  MRN: [id number] Visit Date: 03/31/2023  Today's healthcare provider: Charlton Amor, DO   Chief Complaint  Patient presents with   Follow-up    6 mth follow up- patient requesting a referral to [facility] neurology - Dr. Octaviano Batty. 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     SUBJECTIVE    Chief Complaint  Patient presents with   Follow-up    6 mth follow up- patient requesting a referral to [facility] neurology - Dr. Octaviano Batty. 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    HPI HPI     Follow-up    Additional comments: 6 mth follow up- patient requesting a referral to [facility] neurology - Dr. Octaviano Batty. 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       Last edited by Elizabeth Palau, LPN on 02/16/8118  2:45 PM.      Pt presents for follow up. Since our last visit, her dog has passed away. They had to put her down the week before Christmas. She is handling this as best as she could. She has had some weight loss and has been trying to increase muscle mass.   She has a few concerns today. She is noting an increase in dropping things out of her hands. She is also having muscle atrophy. She says that she also has high arches. She has a grandmother with a hx of charcot marie tooth and is concerned she is having similar symptoms.   She also needs a refill on her medications.   Review of Systems  Constitutional:  Negative for activity change, fatigue and fever.  Respiratory:  Negative for cough and shortness of breath.   Cardiovascular:  Negative for chest pain.  Gastrointestinal:  Negative for abdominal pain.  Genitourinary:   Negative for difficulty urinating.       Current Meds  Medication Sig   Fexofenadine HCl (ALLEGRA PO) Take by mouth. PRN   [DISCONTINUED] fluticasone (FLONASE) 50 MCG/ACT nasal spray Place into both nostrils daily.    OBJECTIVE    BP 108/72   Pulse 60   Ht 5\' 5"  (1.651 m)   Wt 118 lb 4 oz (53.6 kg)   SpO2 100%   BMI 19.68 kg/m   Physical Exam Vitals and nursing note reviewed.  Constitutional:      General: She is not in acute distress.    Appearance: Normal appearance.  HENT:     Head: Normocephalic and atraumatic.     Right Ear: External ear normal.     Left Ear: External ear normal.     Nose: Nose normal.  Eyes:     Conjunctiva/sclera: Conjunctivae normal.  Cardiovascular:     Rate and Rhythm: Normal rate and regular rhythm.  Pulmonary:     Effort: Pulmonary effort is normal.     Breath sounds: Normal breath sounds.  Musculoskeletal:     Comments: Muscle atrophy of calf muscles bilaterally  Neurological:     General: No focal deficit present.     Mental Status: She is alert and oriented to person, place,  and time.  Psychiatric:        Mood and Affect: Mood normal.        Behavior: Behavior normal.        Thought Content: Thought content normal.        Judgment: Judgment normal.       ASSESSMENT & PLAN    Problem List Items Addressed This Visit       Digestive   Acid reflux    - refill omeprazole - if she is taking this consistently would like to get blood work at next visit      Relevant Medications   omeprazole (PRILOSEC) 20 MG capsule     Musculoskeletal and Integument   Muscular atrophy - Primary    - pt has multiple characteristics concerning for charcot marie tooth - will send referral to neurology for further investigation and assessment.       Relevant Orders   Ambulatory referral to Neurology     Other   Nausea    - will send refill of phenergan  - also wonder if her nausea is related to IBS like symptoms      Relevant  Medications   promethazine (PHENERGAN) 12.5 MG tablet    Return in about 6 months (around 10/01/2023).      Meds ordered this encounter  Medications   promethazine (PHENERGAN) 12.5 MG tablet    Sig: Take 1 tablet (12.5 mg total) by mouth every 6 (six) hours as needed for nausea or vomiting.    Dispense:  30 tablet    Refill:  0   omeprazole (PRILOSEC) 20 MG capsule    Sig: Take 1 capsule (20 mg total) by mouth daily.    Dispense:  90 capsule    Refill:  0   fluticasone (FLONASE) 50 MCG/ACT nasal spray    Sig: Place 2 sprays into both nostrils daily.    Dispense:  16 g    Refill:  3    Orders Placed This Encounter  Procedures   Ambulatory referral to Neurology    Referral Priority:   Routine    Referral Type:   Consultation    Referral Reason:   Specialty Services Required    Requested Specialty:   Neurology    Number of Visits Requested:   1     Charlton Amor, DO  [facility] at [facility] [phone] (phone) [phone] (fax)  [facility]

## 2023-04-01 ENCOUNTER — Encounter: Payer: Self-pay | Admitting: Neurology

## 2023-04-07 NOTE — Telephone Encounter (Signed)
Absolutely OK to schedule with Dr. Rhea Belton -please see notes from 2022 RG

## 2023-05-12 NOTE — Progress Notes (Signed)
Initial neurology clinic note  Reason for Evaluation: Consultation requested by Charlton Amor, DO for an opinion regarding numbness and tingling in feet and hands and weakness. My final recommendations will be communicated back to the requesting physician by way of shared medical record or letter to requesting physician via [place] mail.  HPI: This is Ms. Diane Gutierrez, a 57 y.o. 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 The patient is accompanied by husband.  Patient first noticed symptoms for about 3 years. She first noticed numbness and tingling in her feet. She experiences shock sensations, now including her hands, especially over the last year. The symptoms have progressed since onset. She has difficulty clearing her right foot when walking. She has almost fallen several times, but not had a fall. Patient endorses muscle loss in lower legs. She endorses high arches.  Patient has chronic dizziness that she has attributed to her neck. She can feel like she is rocking when sitting. She has neck pain and turning her head can cause dizziness or her to pass out. She can have blurry vision.  She endorses cramps and twitching.  Patient is concerned about CMT due to grandmother and great grandfather having CMT. Grandmother had symptoms in her 30s and had drop foot. She also has friends with MS and is concerned about this.  She was seen in 2022 and 2023 by [facility] for headaches and dizziness and mentioned CMT, but  didn't seem like much was done. She never had EMG. MRI of her brain that was unremarkable.  She does not report any constitutional symptoms like fever or unintentional weight loss. She has night sweats requiring her to change her sheets or clothes. She wonders if this is hormone  related.  She also mentions feeling her skull has changed with more indents then prior.  She also notes that she has had some left sided weakness that is chronic since her neck surgery.  EtOH use: None  Restrictive diet? Does not eat a lot of meat   MEDICATIONS:  Outpatient Encounter Medications as of 05/20/2023  Medication Sig   Ascorbic Acid (VITAMIN C) 100 MG tablet Take 500 mg by mouth daily.   BIOTIN PO Take 1 tablet by mouth daily.   Fexofenadine HCl (ALLEGRA PO) Take by mouth. PRN   fluticasone (FLONASE) 50 MCG/ACT nasal spray Place 2 sprays into both nostrils daily.   metoprolol succinate (TOPROL-XL) 25 MG 24 hr tablet Take 1 tablet (25 mg total) by mouth daily with or immediately following a meal.   nitroGLYCERIN (NITROSTAT) 0.4 MG SL tablet PLACE ONE TABLET UNDER THE TONGUE AT ONSET OF CHEST PAIN. MAY REPEAT EVERY 5 MINUTES AS NEEDED FOR CHEST PAIN UP TO 3 TABLETS IN 15 MINUTES. IF NO RELIEF AFTER 5 MINUTES, CALL 911.   omeprazole (PRILOSEC) 20 MG capsule Take 1 capsule (20 mg total) by mouth daily.   promethazine (PHENERGAN) 12.5 MG tablet Take 1 tablet (12.5 mg total) by mouth every 6 (six) hours as needed for nausea or vomiting.   [DISCONTINUED] amoxicillin-clavulanate (AUGMENTIN) 875-125 MG tablet Take 1 tablet by mouth 2 (two) times daily.   No facility-administered encounter medications on file as of 05/20/2023.    PAST MEDICAL HISTORY: Past Medical History:  Diagnosis Date   Abdominal pain    Acid reflux  Anxiety    Colicky RLQ abdominal pain 07/07/2019   Fibrocystic breast 07/10/2019   Hiatal hernia    Hyperlipidemia    Leukopenia    Low back pain    Lung nodule    Neck pain    Palpitations    Precordial pain 08/08/2021   Myoview 12/30/22: EF 69, no ischemia or infarction; low risk // TTE 12/30/22: EF 55-60, no RWMA, NL PASP, RAP 3   PVC (premature ventricular contraction) 06/22/2019   Monitor 01/2023: NSR, rare PACs and rare PVCs; single brief run of PVCs;  no sustained arrhythmias   Seasonal allergies    Seizure (HCC)    childhood   Sneezing    SOB (shortness of breath)    Echo 10/22: EF 60-65, no RWMA, normal RVSF, mild MR   Tachycardia    Tachycardia 07/07/2019   Umbilical hernia    Weight gain     PAST SURGICAL HISTORY: Past Surgical History:  Procedure Laterality Date   BREAST BIOPSY Left    BREAST SURGERY     lumpectomy, cyst aspirated.on left   CHOLECYSTECTOMY     COLONOSCOPY W/ POLYPECTOMY     HYSTEROSCOPY DIAGNOSTIC     JOINT REPLACEMENT Left    knee x 2 with screws in place   KNEE SURGERY     NECK SURGERY     Dr Noel Gerold   ROBOTIC ASSISTED TOTAL HYSTERECTOMY     precancerous, with BSO    ALLERGIES: Allergies  Allergen Reactions   Prednisone Rash and Hives    RASH, severe  Whelps and skin feels like it is on fire   Ibuprofen     GI UPSET  Other Reaction(s): GI Intolerance    FAMILY HISTORY: Family History  Problem Relation Age of Onset   Arthritis Mother    Hearing loss Mother    Heart disease Mother        MI   Hyperlipidemia Mother    Thyroid cancer Mother    Hypertension Father    Heart disease Father        s/p CABG   Diabetes Father    Hyperlipidemia Father    Other Father    Drug abuse Sister        crack cocaine   Other Sister    Diabetes Sister        smoker   Bipolar disorder Sister    Heart disease Brother    Sleep apnea Brother    Arthritis Maternal Grandmother    Heart disease Maternal Grandmother    Arthritis Maternal Grandfather    Heart disease Maternal Grandfather    Heart disease Paternal Grandmother    Cancer Paternal Grandfather    Heart disease Paternal Grandfather    Colon cancer Neg Hx    Esophageal cancer Neg Hx    Rectal cancer Neg Hx    Stomach cancer Neg Hx     SOCIAL HISTORY: Social History   Tobacco Use   Smoking status: Never   Smokeless tobacco: Never  Vaping Use   Vaping Use: Never used  Substance Use Topics   Alcohol use: Not Currently   Drug  use: Never   Social History   Social History Narrative   Lives with husband is on disability, previously worked for [employer], no dietary restrictions.      OBJECTIVE: PHYSICAL EXAM: BP 116/63   Pulse 63   Ht 5\' 5"  (1.651 m)   Wt 117 lb 12.8 oz (53.4 kg)   BMI  19.60 kg/m   General: General appearance: Awake and alert. No distress. Cooperative with exam.  Skin: No obvious rash or jaundice. HEENT: Atraumatic. Anicteric. Lungs: Non-labored breathing on room air  Extremities: No edema. High arches. No hammertoes. Psych: Affect appropriate.  Neurological: Mental Status: Alert. Speech fluent. No pseudobulbar affect Cranial Nerves: CNII: No RAPD. Visual fields grossly intact. CNIII, IV, VI: PERRL. No nystagmus. EOMI. CN V: Facial sensation intact bilaterally to fine touch. CN VII: Facial muscles symmetric and strong. No ptosis at rest. CN VIII: Hearing grossly intact bilaterally. CN IX: No hypophonia. CN X: Palate elevates symmetrically. CN XI: Full strength shoulder shrug bilaterally. CN XII: Tongue protrusion full and midline. No atrophy or fasciculations. No significant dysarthria Motor: Tone is normal. No fasciculations in extremities. No atrophy.  Individual muscle group testing (MRC grade out of 5): Give way weakness bilaterally in most muscles tested  Movement     Neck flexion 5    Neck extension 5     Right Left   Shoulder abduction 5 5-   Shoulder adduction 5 5   Elbow flexion 5 5   Elbow extension 5 5   Wrist extension 5 5   Wrist flexion 5 5   Finger abduction - FDI 5 5   Finger abduction - ADM 5 5   Finger extension 5 5-   Finger distal flexion - 2/3 5 5    Finger distal flexion - 4/5 5 5    Thumb flexion - FPL 5 5-   Thumb abduction - APB 5 5    Hip flexion 5 5   Hip extension 5 5   Hip adduction 5 5   Hip abduction 5 5   Knee extension 5 5   Knee flexion 5 5   Dorsiflexion 5 5   Plantarflexion 5 5   Inversion 5 5   Eversion 5 5      Reflexes:  Right Left   Bicep 3+ 3+   Tricep 3+ 3+   BrRad 3+ 3+   Knee 3+ 3+   Ankle 2+ 2+    Pathological Reflexes: Babinski: flexor response bilaterally Hoffman: present bilaterally Troemner: present bilaterally Sensation: Pinprick: Intact in all extremities Vibration: Intact in all extremities Proprioception: Intact in bilateral great toes Coordination: Intact finger-to- nose-finger bilaterally. Romberg negative. Gait: Able to rise from chair with arms crossed unassisted. Normal, narrow-based gait. Able to walk on toes and heels.  Lab and Test Review: Internal labs: TSH (11/26/22): 0.946 CBC (11/26/22) unremarkable BMP (11/26/22) unremarkable Lipid panel (09/25/22): Component     Latest Ref Rng 09/25/2022  Cholesterol     <200 mg/dL 161 (H)   HDL Cholesterol     > OR = 50 mg/dL 52   Triglycerides     <150 mg/dL 096   LDL Cholesterol (Calc)     mg/dL (calc) 045 (H)   Total CHOL/HDL Ratio     <5.0 (calc) 4.0   Non-HDL Cholesterol (Calc)     <130 mg/dL (calc) 409 (H)     Imaging: CT head wo contrast (06/07/21): FINDINGS: Brain: No acute intracranial abnormality. Specifically, no hemorrhage, hydrocephalus, mass lesion, acute infarction, or significant intracranial injury.   Vascular: No hyperdense vessel or unexpected calcification.   Skull: No acute calvarial abnormality.   Sinuses/Orbits: No acute findings   Other: None   IMPRESSION: No acute intracranial abnormality.  Echo (12/30/22): Normal  MRI brain wo contrast (11/06/21): FINDINGS:  Mildly motion limited study.   Brain: No acute infarction, hemorrhage,  hydrocephalus, extra-axial  collection or mass lesion.   Vascular: Major arterial flow voids are maintained at the skull  base.   Skull and upper cervical spine: Normal marrow signal.   Sinuses/Orbits: Very mild paranasal sinus mucosal thickening.  Unremarkable orbits.   Other: No mastoid effusions.   IMPRESSION:  Unremarkable brain  MRI.  No acute abnormality.   MRI cervical spine wo contrast (11/06/21): FINDINGS:  Alignment: Straightening of the normal cervical lordosis without  substantial sagittal subluxation.   Vertebrae: Vertebral body heights are maintained. No specific  evidence of acute fracture or discitis/osteomyelitis. No suspicious  bone lesions.   Cord: Normal cord signal.   Posterior Fossa, vertebral arteries, paraspinal tissues: Visualized  vertebral artery flow voids are maintained. Unremarkable visualized  posterior fossa. No appreciable paraspinal edema.   Disc levels:   C2-C3: Mild posterior disc osteophyte complex and facet arthropathy  without significant canal or foraminal stenosis.   C3-C4: Small posterior disc osteophyte complex and bilateral  facet/uncovertebral hypertrophy. Resulting moderate right greater  than left foraminal stenosis, mildly progressed from prior. No  significant canal stenosis.   C4-C5: Small posterior disc osteophyte complex and right greater  than left facet/uncovertebral hypertrophy. Resulting moderate right  and mild left foraminal stenosis. Mild canal stenosis, similar.   C5-C6: ACDF with patent canal and foramina.  Solid bony fusion.   C6-C7: ACDF with patent canal and foramina.  Solid bony fusion.   C7-T1: Mild posterior disc osteophyte complex and right greater than  left facet/uncovertebral hypertrophy. No impingement.   IMPRESSION:  1. 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 Similar mild canal stenosis at C4-C5.  2. C5-C6 and C6-C7 ACDF with solid bony fusion.   ASSESSMENT: Diane Gutierrez is a 57 y.o. female who presents for evaluation of numbness and tingling in legs and hands and weakness. 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 Her neurological examination is pertinent for hyperflexia, mild left side  weakness, and give way weakness. Available diagnostic data is significant for normal MRI brain in 2022 and MRI cervical spine with post op changes and mild canal stenosis with moderate foraminal stenosis at right C4-5 and bilateral C3-4. The etiology of patient's symptoms is unclear. She is concerned for CMT or MS. Her MRI brain and cervical spine just a couple of years ago, after symptom onset does not support this diagnosis, though these were done without contrast. I would expect an earlier age of onset of MS as well. Regarding CMT or any neuropathy, patient has more upper motor neuron signs with hyperreflexia than lower motor neuron as would be expected with CMT. This may be due to cervical spine disease, but a neuropathy could also be masked by this. I will get lab work and EMG to further clarify and look for treatable causes. Repeat imaging may be needed if another cause is not found.  PLAN: -Blood work: CK, HbA1c, B12, IFE, B1, ESR, CRP, vit E, copper -EMG: PN protocol (L > R) -May consider repeating MRI of brain and cervical spine w/wo contrast pending results of EMG  -Return to clinic in 6 months  The impression above as well as the plan as outlined below were extensively discussed with the patient (in the company of husband) who voiced understanding. All questions were answered to their satisfaction.  The patient was counseled on pertinent fall precautions per the printed material provided today, and  as noted under the "Patient Instructions" section below.  When available, results of the above investigations and possible further recommendations will be communicated to the patient via telephone/MyChart. Patient to call office if not contacted after expected testing turnaround time.   Total time spent reviewing records, interview, history/exam, documentation, and coordination of care on day of encounter:  75 min   Thank you for allowing me to participate in patient's care.  If I can answer  any additional questions, I would be pleased to do so.  Jacquelyne Balint, MD   CC: Charlton Amor, DO [address], [address] [place] [address]  CC: Referring provider: Charlton Amor, DO [address] [place],  [place] [address]

## 2023-05-13 ENCOUNTER — Encounter: Payer: Self-pay | Admitting: Physician Assistant

## 2023-05-13 ENCOUNTER — Ambulatory Visit (INDEPENDENT_AMBULATORY_CARE_PROVIDER_SITE_OTHER): Payer: Medicare Other | Admitting: Physician Assistant

## 2023-05-13 ENCOUNTER — Other Ambulatory Visit (HOSPITAL_BASED_OUTPATIENT_CLINIC_OR_DEPARTMENT_OTHER): Payer: Self-pay

## 2023-05-13 VITALS — BP 109/62 | HR 74 | Ht 65.0 in | Wt 119.0 lb

## 2023-05-13 DIAGNOSIS — Z800 Family history of malignant neoplasm of digestive organs: Secondary | ICD-10-CM | POA: Diagnosis not present

## 2023-05-13 DIAGNOSIS — R350 Frequency of micturition: Secondary | ICD-10-CM

## 2023-05-13 DIAGNOSIS — R1032 Left lower quadrant pain: Secondary | ICD-10-CM

## 2023-05-13 DIAGNOSIS — R300 Dysuria: Secondary | ICD-10-CM | POA: Diagnosis not present

## 2023-05-13 LAB — POCT URINALYSIS DIP (CLINITEK)
Bilirubin, UA: NEGATIVE
Blood, UA: NEGATIVE
Glucose, UA: NEGATIVE mg/dL
Ketones, POC UA: NEGATIVE mg/dL
Leukocytes, UA: NEGATIVE
Nitrite, UA: NEGATIVE
POC PROTEIN,UA: NEGATIVE
Spec Grav, UA: 1.01 (ref 1.010–1.025)
Urobilinogen, UA: 0.2 E.U./dL
pH, UA: 7 (ref 5.0–8.0)

## 2023-05-13 LAB — CBC WITH DIFFERENTIAL/PLATELET
Absolute Monocytes: 502 cells/uL (ref 200–950)
HCT: 41.1 % (ref 35.0–45.0)
Hemoglobin: 13.8 g/dL (ref 11.7–15.5)
Lymphs Abs: 2176 cells/uL (ref 850–3900)
MCHC: 33.6 g/dL (ref 32.0–36.0)
MPV: 10.1 fL (ref 7.5–12.5)
Neutrophils Relative %: 54.7 %
Platelets: 253 10*3/uL (ref 140–400)
RBC: 4.51 10*6/uL (ref 3.80–5.10)

## 2023-05-13 MED ORDER — AMOXICILLIN-POT CLAVULANATE 875-125 MG PO TABS
1.0000 | ORAL_TABLET | Freq: Two times a day (BID) | ORAL | 0 refills | Status: DC
Start: 2023-05-13 — End: 2023-05-20
  Filled 2023-05-13: qty 20, 10d supply, fill #0

## 2023-05-13 MED ORDER — PHENAZOPYRIDINE HCL 200 MG PO TABS
200.0000 mg | ORAL_TABLET | Freq: Three times a day (TID) | ORAL | 0 refills | Status: AC
Start: 2023-05-13 — End: 2023-05-15
  Filled 2023-05-13: qty 6, 2d supply, fill #0

## 2023-05-13 NOTE — Progress Notes (Signed)
Acute Office Visit  Subjective:     Patient ID: Diane Gutierrez, female    DOB: [birth year], 57 y.o.   MRN: [id number]  Chief Complaint  Patient presents with   Abdominal Pain   Back Pain   Urinary Frequency    HPI Patient is in today for lower abdominal pain, urinary frequency and low back pain with 1 week of symptoms. Symptoms seem to start fairly suddenly. She does have some burning with urination and some leakage. No fever or chills. She is nauseated and having bilateral flank to low back pain. Never had kidney stone before. No problems with bowel movements or changes in color. Her father did have colon cancer and she has hx of colon polyps that have been removed. Last polyp removed 2022. Pain does seem to be worse in left lower quadrant and suprapubic area. She has had a complete hysterectomy in January 2019. No vaginal discharge noted.   .. Active Ambulatory Problems    Diagnosis Date Noted   PVC (premature ventricular contraction) 06/22/2019   Tachycardia 07/07/2019   Anxiety    Hyperlipidemia    Seasonal allergies    Neck pain    Low back pain    Acid reflux    IBS (irritable bowel syndrome) 07/07/2019   Fibrocystic breast 07/10/2019   Dysphagia 10/09/2019   Urinary frequency 08/08/2020   Preventative health care 02/04/2021   Sun-damaged skin 02/04/2021   Precordial pain 08/08/2021   Breast lesion 08/09/2021   Dysuria 08/09/2021   Family history of colon cancer 08/26/2022   Routine adult health maintenance 08/26/2022   Other dental procedure status 09/30/2022   Nausea 09/30/2022   Abnormal mammogram 09/30/2022   Near syncope 12/09/2022   Preoperative cardiovascular examination 12/09/2022   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     Objective:    BP 109/62 (BP Location: Right Arm, Patient Position: Sitting, Cuff Size: Normal)   Pulse 74   Ht 5\' 5"  (1.651 m)   Wt 119 lb (54 kg)   SpO2 99%   BMI 19.80 kg/m  BP Readings from Last 3 Encounters:  05/13/23 109/62  03/31/23 108/72  02/24/23 101/70   Wt Readings from Last 3 Encounters:  05/13/23 119 lb (54 kg)  03/31/23 118 lb 4 oz (53.6 kg)  02/24/23 119 lb (54 kg)    .Marland Kitchen Results for orders placed or performed in visit on 05/13/23  Urine Culture   Specimen: Urine  Result Value Ref Range   MICRO NUMBER: [id number]    SPECIMEN QUALITY: Adequate    Sample Source URINE    STATUS: FINAL    Result: No Growth   COMPLETE METABOLIC PANEL WITH GFR  Result Value Ref Range   Glucose, Bld 85 65 - 99 mg/dL   BUN 13 7 - 25 mg/dL   Creat 6.96 2.95 - 2.84 mg/dL   eGFR 97 > OR = 60 XL/KGM/0.10U7   BUN/Creatinine Ratio SEE NOTE: 6 - 22 (calc)  Sodium 140 135 - 146 mmol/L   Potassium 4.7 3.5 - 5.3 mmol/L   Chloride 106 98 - 110 mmol/L   CO2 27 20 - 32 mmol/L   Calcium 10.0 8.6 - 10.4 mg/dL   Total Protein 7.0 6.1 - 8.1 g/dL   Albumin 4.5 3.6 - 5.1 g/dL   Globulin 2.5 1.9 - 3.7 g/dL (calc)   AG Ratio 1.8 1.0 - 2.5 (calc)   Total Bilirubin 0.4 0.2 - 1.2 mg/dL   Alkaline phosphatase (APISO) 71 37 - 153 U/L   AST 22 10 - 35 U/L   ALT 15 6 - 29 U/L  CBC w/Diff/Platelet  Result Value Ref Range   WBC 6.2 3.8 - 10.8 Thousand/uL   RBC 4.51 3.80 - 5.10 Million/uL   Hemoglobin 13.8 11.7 - 15.5 g/dL   HCT 62.1 30.8 - 65.7 %   MCV 91.1 80.0 - 100.0 fL   MCH 30.6 27.0 - 33.0 pg   MCHC 33.6 32.0 - 36.0 g/dL   RDW 84.6 96.2 - 95.2 %   Platelets 253 140 - 400 Thousand/uL   MPV 10.1 7.5 - 12.5 fL   Neutro Abs 3,391 1,500 - 7,800 cells/uL   Lymphs Abs 2,176 850 - 3,900 cells/uL   Absolute Monocytes 502 200 - 950 cells/uL   Eosinophils  Absolute 99 15 - 500 cells/uL   Basophils Absolute 31 0 - 200 cells/uL   Neutrophils Relative % 54.7 %   Total Lymphocyte 35.1 %   Monocytes Relative 8.1 %   Eosinophils Relative 1.6 %   Basophils Relative 0.5 %  POCT URINALYSIS DIP (CLINITEK)  Result Value Ref Range   Color, UA yellow yellow   Clarity, UA clear clear   Glucose, UA negative negative mg/dL   Bilirubin, UA negative negative   Ketones, POC UA negative negative mg/dL   Spec Grav, UA 8.413 2.440 - 1.025   Blood, UA negative negative   pH, UA 7.0 5.0 - 8.0   POC PROTEIN,UA negative negative, trace   Urobilinogen, UA 0.2 0.2 or 1.0 E.U./dL   Nitrite, UA Negative Negative   Leukocytes, UA Negative Negative     Physical Exam Constitutional:      Appearance: She is well-developed.  HENT:     Head: Normocephalic.  Cardiovascular:     Rate and Rhythm: Normal rate.  Pulmonary:     Effort: Pulmonary effort is normal.  Abdominal:     General: Bowel sounds are normal. There is no distension.     Palpations: Abdomen is soft.     Tenderness: There is abdominal tenderness in the suprapubic area and left lower quadrant. There is right CVA tenderness and left CVA tenderness. There is no guarding or rebound. Negative signs include Murphy's sign, Rovsing's sign, McBurney's sign, psoas sign and obturator sign.     Hernia: No hernia is present.  Neurological:     General: No focal deficit present.     Mental Status: She is alert.  Psychiatric:        Mood and Affect: Mood normal.           Assessment & Plan:  Marland KitchenMarland KitchenBrittish was seen today for abdominal pain, back pain and urinary frequency.  Diagnoses and all orders for this visit:  Urine frequency -     POCT URINALYSIS DIP (CLINITEK) -     Urine Culture -     phenazopyridine (PYRIDIUM) 200 MG tablet; Take 1 tablet (200 mg total) by mouth 3 (three) times daily  for 2 days.  Left lower quadrant pain -     amoxicillin-clavulanate (AUGMENTIN) 875-125 MG tablet; Take 1  tablet by mouth 2 (two) times daily.  Family history of colon cancer  Dysuria -     phenazopyridine (PYRIDIUM) 200 MG tablet; Take 1 tablet (200 mg total) by mouth 3 (three) times daily for 2 days.  Left lower quadrant abdominal pain -     Cancel: CT Abdomen Pelvis W Contrast; Future -     COMPLETE METABOLIC PANEL WITH GFR -     CBC w/Diff/Platelet -     CT Abdomen Pelvis W Contrast; Future   UA negative for blood, protein, leukocytes, nitrites.  Not indicative of infection Will culture Empirically start augmentin for bacterial infection and pyridium for bladder inflammation Cbc and cmp ordered today CT ordered to evaluate for kidney stone vs diverticulitis vs colon mass(due to family hx of cancer and personal history of polyp).  Follow up as needed or if symptoms worsen or persist.   Spent 40 minutes with patient in chart review, discussing plan and coordinating care.    Tandy Gaw, PA-C

## 2023-05-13 NOTE — Patient Instructions (Addendum)
Urine culture pending CT ordered Pyridium and augmentin sent to pharmacy Follow up as needed or if symptoms worsen

## 2023-05-14 LAB — CBC WITH DIFFERENTIAL/PLATELET
Basophils Absolute: 31 cells/uL (ref 0–200)
Basophils Relative: 0.5 %
Eosinophils Absolute: 99 cells/uL (ref 15–500)
Eosinophils Relative: 1.6 %
MCH: 30.6 pg (ref 27.0–33.0)
MCV: 91.1 fL (ref 80.0–100.0)
Monocytes Relative: 8.1 %
Neutro Abs: 3391 cells/uL (ref 1500–7800)
RDW: 12 % (ref 11.0–15.0)
Total Lymphocyte: 35.1 %
WBC: 6.2 10*3/uL (ref 3.8–10.8)

## 2023-05-14 LAB — COMPLETE METABOLIC PANEL WITH GFR
AG Ratio: 1.8 (calc) (ref 1.0–2.5)
ALT: 15 U/L (ref 6–29)
AST: 22 U/L (ref 10–35)
Albumin: 4.5 g/dL (ref 3.6–5.1)
Alkaline phosphatase (APISO): 71 U/L (ref 37–153)
BUN: 13 mg/dL (ref 7–25)
CO2: 27 mmol/L (ref 20–32)
Calcium: 10 mg/dL (ref 8.6–10.4)
Chloride: 106 mmol/L (ref 98–110)
Creat: 0.72 mg/dL (ref 0.50–1.03)
Globulin: 2.5 g/dL (calc) (ref 1.9–3.7)
Glucose, Bld: 85 mg/dL (ref 65–99)
Potassium: 4.7 mmol/L (ref 3.5–5.3)
Sodium: 140 mmol/L (ref 135–146)
Total Bilirubin: 0.4 mg/dL (ref 0.2–1.2)
Total Protein: 7 g/dL (ref 6.1–8.1)
eGFR: 97 mL/min/{1.73_m2} (ref >=60)

## 2023-05-14 LAB — URINE CULTURE
MICRO NUMBER:: 15158970
Result:: NO GROWTH
SPECIMEN QUALITY:: ADEQUATE

## 2023-05-15 ENCOUNTER — Ambulatory Visit (HOSPITAL_BASED_OUTPATIENT_CLINIC_OR_DEPARTMENT_OTHER)
Admission: RE | Admit: 2023-05-15 | Discharge: 2023-05-15 | Disposition: A | Discharge status: Home / Self Care | Payer: Medicare Other | Source: Ambulatory Visit | Attending: Physician Assistant | Admitting: Physician Assistant

## 2023-05-15 DIAGNOSIS — R1032 Left lower quadrant pain: Secondary | ICD-10-CM | POA: Diagnosis not present

## 2023-05-15 MED ORDER — IOHEXOL 9 MG/ML PO SOLN: 500.0000 mL | ORAL | Status: AC

## 2023-05-15 MED ORDER — IOHEXOL 300 MG/ML  SOLN
80.0000 mL | Freq: Once | INTRAMUSCULAR | Status: AC | PRN
  Administered 2023-05-15: 80 mL via INTRAVENOUS

## 2023-05-15 NOTE — Progress Notes (Signed)
Kim,   Normal WBC and negative for any bacteria in urine culture. Kidney, liver, glucose look good. How are your symptoms?

## 2023-05-19 ENCOUNTER — Encounter: Payer: Self-pay | Admitting: Physician Assistant

## 2023-05-19 NOTE — Progress Notes (Signed)
No masses, diverticulitis, stones or causes for abdominal pain. How are you today?

## 2023-05-20 ENCOUNTER — Ambulatory Visit: Payer: Medicare Other | Admitting: Neurology

## 2023-05-20 ENCOUNTER — Encounter: Payer: Self-pay | Admitting: Neurology

## 2023-05-20 VITALS — BP 116/63 | HR 63 | Ht 65.0 in | Wt 117.8 lb

## 2023-05-20 DIAGNOSIS — R292 Abnormal reflex: Secondary | ICD-10-CM

## 2023-05-20 DIAGNOSIS — M47812 Spondylosis without myelopathy or radiculopathy, cervical region: Secondary | ICD-10-CM

## 2023-05-20 DIAGNOSIS — R202 Paresthesia of skin: Secondary | ICD-10-CM | POA: Diagnosis not present

## 2023-05-20 DIAGNOSIS — Z131 Encounter for screening for diabetes mellitus: Secondary | ICD-10-CM | POA: Diagnosis not present

## 2023-05-20 DIAGNOSIS — R200 Anesthesia of skin: Secondary | ICD-10-CM

## 2023-05-20 NOTE — Patient Instructions (Signed)
I would like to investigate your symptoms further with the following: -Blood work -Muscle and nerve test called an EMG (see more information below)  I will be in contact when I have your results.  I will see you back in clinic to re-examine you in about 6 months.  The physicians and staff at [facility] are committed to providing excellent care. You may receive a survey requesting feedback about your experience at our office. We strive to receive "very good" responses to the survey questions. If you feel that your experience would prevent you from giving the office a "very good " response, please contact our office to try to remedy the situation. We may be reached at [phone]. Thank you for taking the time out of your busy day to complete the survey.  Jacquelyne Balint, MD [facility]  ELECTROMYOGRAM AND NERVE CONDUCTION STUDIES (EMG/NCS) INSTRUCTIONS  How to Prepare The neurologist conducting the EMG will need to know if you have certain medical conditions. Tell the neurologist and other EMG lab personnel if you: Have a pacemaker or any other electrical medical device Take blood-thinning medications Have hemophilia, a blood-clotting disorder that causes prolonged bleeding Bathing Take a shower or bath shortly before your exam in order to remove oils from your skin. Don't apply lotions or creams before the exam.  What to Expect You'll likely be asked to change into a hospital gown for the procedure and lie down on an examination table. The following explanations can help you understand what will happen during the exam.  Electrodes. The neurologist or a technician places surface electrodes at various locations on your skin depending on where you're experiencing symptoms. Or the neurologist may insert needle electrodes at different sites depending on your symptoms.  Sensations. The electrodes will at times transmit a tiny electrical current that you may feel as a twinge or spasm. The  needle electrode may cause discomfort or pain that usually ends shortly after the needle is removed. If you are concerned about discomfort or pain, you may want to talk to the neurologist about taking a short break during the exam.  Instructions. During the needle EMG, the neurologist will assess whether there is any spontaneous electrical activity when the muscle is at rest - activity that isn't present in healthy muscle tissue - and the degree of activity when you slightly contract the muscle.  He or she will give you instructions on resting and contracting a muscle at appropriate times. Depending on what muscles and nerves the neurologist is examining, he or she may ask you to change positions during the exam.  After your EMG You may experience some temporary, minor bruising where the needle electrode was inserted into your muscle. This bruising should fade within several days. If it persists, contact your primary care doctor.    Preventing Falls at [facility] are common, often dreaded events in the lives of older people. Aside from the obvious injuries and even death that may result, fall can cause wide-ranging consequences including loss of independence, mental decline, decreased activity and mobility. Younger people are also at risk of falling, especially those with chronic illnesses and fatigue.  Ways to reduce risk for falling Examine diet and medications. Warm foods and alcohol dilate blood vessels, which can lead to dizziness when standing. Sleep aids, antidepressants and pain medications can also increase the likelihood of a fall.  Get a vision exam. Poor vision, cataracts and glaucoma increase the chances of falling.  Check foot gear.  Shoes should fit snugly and have a sturdy, nonskid sole and a broad, low heel  Participate in a physician-approved exercise program to build and maintain muscle strength and improve balance and coordination. Programs that use ankle weights or stretch  bands are excellent for muscle-strengthening. Water aerobics programs and low-impact Tai Chi programs have also been shown to improve balance and coordination.  Increase vitamin D intake. Vitamin D improves muscle strength and increases the amount of calcium the body is able to absorb and deposit in bones.  How to prevent falls from common hazards Floors - Remove all loose wires, cords, and throw rugs. Minimize clutter. Make sure rugs are anchored and smooth. Keep furniture in its usual place.  Chairs -- Use chairs with straight backs, armrests and firm seats. Add firm cushions to existing pieces to add height.  Bathroom - Install grab bars and non-skid tape in the tub or shower. Use a bathtub transfer bench or a shower chair with a back support Use an elevated toilet seat and/or safety rails to assist standing from a low surface. Do not use towel racks or bathroom tissue holders to help you stand.  Lighting - Make sure halls, stairways, and entrances are well-lit. Install a night light in your bathroom or hallway. Make sure there is a light switch at the top and bottom of the staircase. Turn lights on if you get up in the middle of the night. Make sure lamps or light switches are within reach of the bed if you have to get up during the night.  Kitchen - Install non-skid rubber mats near the sink and stove. Clean spills immediately. Store frequently used utensils, pots, pans between waist and eye level. This helps prevent reaching and bending. Sit when getting things out of lower cupboards.  Living room/ Bedrooms - Place furniture with wide spaces in between, giving enough room to move around. Establish a route through the living room that gives you something to hold onto as you walk.  Stairs - Make sure treads, rails, and rugs are secure. Install a rail on both sides of the stairs. If stairs are a threat, it might be helpful to arrange most of your activities on the lower level to reduce the number  of times you must climb the stairs.  Entrances and doorways - Install metal handles on the walls adjacent to the doorknobs of all doors to make it more secure as you travel through the doorway.  Tips for maintaining balance Keep at least one hand free at all times. Try using a backpack or fanny pack to hold things rather than carrying them in your hands. Never carry objects in both hands when walking as this interferes with keeping your balance.  Attempt to swing both arms from front to back while walking. This might require a conscious effort if Parkinson's disease has diminished your movement. It will, however, help you to maintain balance and posture, and reduce fatigue.  Consciously lift your feet off of the ground when walking. Shuffling and dragging of the feet is a common culprit in losing your balance.  When trying to navigate turns, use a "U" technique of facing forward and making a wide turn, rather than pivoting sharply.  Try to stand with your feet shoulder-length apart. When your feet are close together for any length of time, you increase your risk of losing your balance and falling.  Do one thing at a time. Don't try to walk and accomplish another task, such as  reading or looking around. The decrease in your automatic reflexes complicates motor function, so the less distraction, the better.  Do not wear rubber or gripping soled shoes, they might "catch" on the floor and cause tripping.  Move slowly when changing positions. Use deliberate, concentrated movements and, if needed, use a grab bar or walking aid. Count 15 seconds between each movement. For example, when rising from a seated position, wait 15 seconds after standing to begin walking.  If balance is a continuous problem, you might want to consider a walking aid such as a cane, walking stick, or walker. Once you've mastered walking with help, you might be ready to try it on your own again.

## 2023-05-30 ENCOUNTER — Other Ambulatory Visit: Payer: Self-pay

## 2023-05-30 DIAGNOSIS — S0083XA Contusion of other part of head, initial encounter: Secondary | ICD-10-CM | POA: Diagnosis not present

## 2023-05-30 DIAGNOSIS — Y9301 Activity, walking, marching and hiking: Secondary | ICD-10-CM | POA: Insufficient documentation

## 2023-05-30 DIAGNOSIS — F0781 Postconcussional syndrome: Secondary | ICD-10-CM | POA: Insufficient documentation

## 2023-05-30 DIAGNOSIS — S0990XA Unspecified injury of head, initial encounter: Secondary | ICD-10-CM | POA: Diagnosis not present

## 2023-05-30 DIAGNOSIS — Y92512 Supermarket, store or market as the place of occurrence of the external cause: Secondary | ICD-10-CM | POA: Insufficient documentation

## 2023-05-30 DIAGNOSIS — W228XXA Striking against or struck by other objects, initial encounter: Secondary | ICD-10-CM | POA: Diagnosis not present

## 2023-05-30 NOTE — ED Triage Notes (Addendum)
Pt states that she hit her head on a brick overhang of a building while walking.  Denies LOC, N/V. Not on a thinner. Endorses HA. Tried tylenol at 8:15pm. Ambulatory, A&Ox4 Bruise on forehead.

## 2023-05-31 ENCOUNTER — Emergency Department (HOSPITAL_BASED_OUTPATIENT_CLINIC_OR_DEPARTMENT_OTHER)
Admission: EM | Admit: 2023-05-31 | Discharge: 2023-05-31 | Disposition: A | Discharge status: Home / Self Care | Payer: Medicare Other | Source: Home / Self Care | Attending: Emergency Medicine | Admitting: Emergency Medicine

## 2023-05-31 DIAGNOSIS — F0781 Postconcussional syndrome: Secondary | ICD-10-CM

## 2023-05-31 DIAGNOSIS — S0990XA Unspecified injury of head, initial encounter: Secondary | ICD-10-CM

## 2023-05-31 NOTE — ED Provider Notes (Signed)
[facility] [place]  Provider Note  CSN: [id number] Arrival date & time: 05/30/23 2001  History No chief complaint on file.   Diane Gutierrez is a 57 y.o. female reports she was walking out of [employer] in the rain earlier today when her raincoat covered her face and she struck her R forehead on a brick overhang. She denies LOC but reports feeling dazed for a moment. She had some swelling which has gone down. She reports some headache that has improved with APAP. She is not on anticoagulation. No other injuries.    Home Medications Prior to Admission medications   Medication Sig Start Date End Date Taking? Authorizing Provider  Ascorbic Acid (VITAMIN C) 100 MG tablet Take 500 mg by mouth daily.    [provider]  BIOTIN PO Take 1 tablet by mouth daily.    [provider]  Fexofenadine HCl (ALLEGRA PO) Take by mouth. PRN    [provider]  fluticasone (FLONASE) 50 MCG/ACT nasal spray Place 2 sprays into both nostrils daily. 03/31/23   Charlton Amor, DO  metoprolol succinate (TOPROL-XL) 25 MG 24 hr tablet Take 1 tablet (25 mg total) by mouth daily with or immediately following a meal. 11/17/22   Corky Crafts, MD  nitroGLYCERIN (NITROSTAT) 0.4 MG SL tablet PLACE ONE TABLET UNDER THE TONGUE AT ONSET OF CHEST PAIN. MAY REPEAT EVERY 5 MINUTES AS NEEDED FOR CHEST PAIN UP TO 3 TABLETS IN 15 MINUTES. IF NO RELIEF AFTER 5 MINUTES, CALL 911. 10/22/22   Everrett Coombe, DO  omeprazole (PRILOSEC) 20 MG capsule Take 1 capsule (20 mg total) by mouth daily. 03/31/23   Charlton Amor, DO  promethazine (PHENERGAN) 12.5 MG tablet Take 1 tablet (12.5 mg total) by mouth every 6 (six) hours as needed for nausea or vomiting. 03/31/23   Charlton Amor, DO     Allergies    Prednisone and Ibuprofen   Review of Systems   Review of Systems Please see HPI for pertinent positives and negatives  Physical Exam BP 125/64   Pulse 71    Temp 98.3 F (36.8 C) (Oral)   Resp 15   Wt 53 kg   SpO2 100%   BMI 19.44 kg/m   Physical Exam Vitals and nursing note reviewed.  Constitutional:      Appearance: Normal appearance.  HENT:     Head: Normocephalic.     Comments: Bruise to R forehead    Nose: Nose normal.     Mouth/Throat:     Mouth: Mucous membranes are moist.  Eyes:     Extraocular Movements: Extraocular movements intact.     Conjunctiva/sclera: Conjunctivae normal.  Cardiovascular:     Rate and Rhythm: Normal rate.  Pulmonary:     Effort: Pulmonary effort is normal.     Breath sounds: Normal breath sounds.  Abdominal:     General: Abdomen is flat.     Palpations: Abdomen is soft.     Tenderness: There is no abdominal tenderness.  Musculoskeletal:        General: No swelling. Normal range of motion.     Cervical back: Neck supple.  Skin:    General: Skin is warm and dry.  Neurological:     General: No focal deficit present.     Mental Status: She is alert and oriented to person, place, and time.     Cranial Nerves: No cranial nerve deficit.     Sensory:  No sensory deficit.     Motor: No weakness.     Gait: Gait normal.  Psychiatric:        Mood and Affect: Mood normal.     ED Results / Procedures / Treatments   EKG None  Procedures Procedures  Medications Ordered in the ED Medications - No data to display  Initial Impression and Plan  Patient here after minor head injury with some continued symptoms of likely mild concussion. Discussed risks and benefits of imaging, low suspicion for clinically significant intracranial hemorrhage. She is comfortable with deferring imaging at this time. Given head injury precautions and post-concussion information. Continue APAP for headache. PCP follow up, RTED for any other concerns.    ED Course       MDM Rules/Calculators/A&P Medical Decision Making Problems Addressed: Injury of head, initial encounter: acute illness or injury Post concussion  syndrome: acute illness or injury  Risk OTC drugs.     Final Clinical Impression(s) / ED Diagnoses Final diagnoses:  Injury of head, initial encounter  Post concussion syndrome    Rx / DC Orders ED Discharge Orders     None        Pollyann Savoy, MD 05/31/23 [phone]

## 2023-05-31 NOTE — ED Notes (Signed)
Patient verbalizes understanding of discharge instructions. Opportunity for questioning and answers were provided. Armband removed by staff, pt discharged from ED. Ambulated out to lobby with husband 

## 2023-06-01 ENCOUNTER — Other Ambulatory Visit (HOSPITAL_BASED_OUTPATIENT_CLINIC_OR_DEPARTMENT_OTHER): Payer: Self-pay

## 2023-06-01 MED FILL — Metoprolol Succinate Tab ER 24HR 25 MG (Tartrate Equiv): ORAL | 90 days supply | Qty: 90 | Fill #1 | Status: AC

## 2023-06-22 ENCOUNTER — Encounter: Payer: Medicare Other | Admitting: Obstetrics and Gynecology

## 2023-06-23 ENCOUNTER — Ambulatory Visit (INDEPENDENT_AMBULATORY_CARE_PROVIDER_SITE_OTHER): Payer: Medicare Other | Admitting: Family Medicine

## 2023-06-23 VITALS — BP 104/72 | HR 73 | Ht 65.0 in | Wt 116.0 lb

## 2023-06-23 DIAGNOSIS — Z0000 Encounter for general adult medical examination without abnormal findings: Secondary | ICD-10-CM

## 2023-06-23 NOTE — Patient Instructions (Addendum)
MEDICARE ANNUAL WELLNESS VISIT Health Maintenance Summary and Written Plan of Care  Ms. Diane Gutierrez ,  Thank you for allowing me to perform your Medicare Annual Wellness Visit and for your ongoing commitment to your health.   Health Maintenance & Immunization History Health Maintenance  Topic Date Due   DTaP/Tdap/Td (1 - Tdap) Never done   Colonoscopy  09/27/2023   COVID-19 Vaccine (1) 07/09/2023 (Originally 03/21/1971)   INFLUENZA VACCINE  02/08/2024 (Originally 06/11/2023)   Hepatitis C Screening  06/22/2024 (Originally 03/20/1984)   HIV Screening  06/22/2024 (Originally 03/20/1981)   Medicare Annual Wellness (AWV)  06/22/2024   MAMMOGRAM  09/17/2024   HPV VACCINES  Aged Out   PAP SMEAR-Modifier  Discontinued   Zoster Vaccines- Shingrix  Discontinued    There is no immunization history on file for this patient.  These are the patient goals that we discussed:  Goals Addressed               This Visit's Progress     Patient Stated (pt-stated)        Patient stated that she would like to get her stomach "straightened out".         This is a list of Health Maintenance Items that are overdue or due now: Td vaccine Screening mammography Colorectal cancer screening - due November Influenza vaccine - declined  Patient declined td vaccine and influenza vaccine.   Orders/Referrals Placed Today: No orders of the defined types were placed in this encounter.  (Contact our referral department at [phone] if you have not spoken with someone about your referral appointment within the next 5 days)    Follow-up Plan Follow-up with Charlton Amor, DO as planned Please call and schedule your mammogram for November.  Please call and schedule your colonoscopy for November.  Medicare wellness visit in one year.  AVS printed and mailed to the patient.       Health Maintenance, Female Adopting a healthy lifestyle and getting preventive care are important in promoting health and  wellness. Ask your health care provider about: The right schedule for you to have regular tests and exams. Things you can do on your own to prevent diseases and keep yourself healthy. What should I know about diet, weight, and exercise? Eat a healthy diet  Eat a diet that includes plenty of vegetables, fruits, low-fat dairy products, and lean protein. Do not eat a lot of foods that are high in solid fats, added sugars, or sodium. Maintain a healthy weight Body mass index (BMI) is used to identify weight problems. It estimates body fat based on height and weight. Your health care provider can help determine your BMI and help you achieve or maintain a healthy weight. Get regular exercise Get regular exercise. This is one of the most important things you can do for your health. Most adults should: Exercise for at least 150 minutes each week. The exercise should increase your heart rate and make you sweat (moderate-intensity exercise). Do strengthening exercises at least twice a week. This is in addition to the moderate-intensity exercise. Spend less time sitting. Even light physical activity can be beneficial. Watch cholesterol and blood lipids Have your blood tested for lipids and cholesterol at 57 years of age, then have this test every 5 years. Have your cholesterol levels checked more often if: Your lipid or cholesterol levels are high. You are older than 57 years of age. You are at high risk for heart disease. What should I know about  cancer screening? Depending on your health history and family history, you may need to have cancer screening at various ages. This may include screening for: Breast cancer. Cervical cancer. Colorectal cancer. Skin cancer. Lung cancer. What should I know about heart disease, diabetes, and high blood pressure? Blood pressure and heart disease High blood pressure causes heart disease and increases the risk of stroke. This is more likely to develop in people  who have high blood pressure readings or are overweight. Have your blood pressure checked: Every 3-5 years if you are 66-49 years of age. Every year if you are 55 years old or older. Diabetes Have regular diabetes screenings. This checks your fasting blood sugar level. Have the screening done: Once every three years after age 62 if you are at a normal weight and have a low risk for diabetes. More often and at a younger age if you are overweight or have a high risk for diabetes. What should I know about preventing infection? Hepatitis B If you have a higher risk for hepatitis B, you should be screened for this virus. Talk with your health care provider to find out if you are at risk for hepatitis B infection. Hepatitis C Testing is recommended for: Everyone born from [birth year] through 1965. Anyone with known risk factors for hepatitis C. Sexually transmitted infections (STIs) Get screened for STIs, including gonorrhea and chlamydia, if: You are sexually active and are younger than 57 years of age. You are older than 57 years of age and your health care provider tells you that you are at risk for this type of infection. Your sexual activity has changed since you were last screened, and you are at increased risk for chlamydia or gonorrhea. Ask your health care provider if you are at risk. Ask your health care provider about whether you are at high risk for HIV. Your health care provider may recommend a prescription medicine to help prevent HIV infection. If you choose to take medicine to prevent HIV, you should first get tested for HIV. You should then be tested every 3 months for as long as you are taking the medicine. Pregnancy If you are about to stop having your period (premenopausal) and you may become pregnant, seek counseling before you get pregnant. Take 400 to 800 micrograms (mcg) of folic acid every day if you become pregnant. Ask for birth control (contraception) if you want to prevent  pregnancy. Osteoporosis and menopause Osteoporosis is a disease in which the bones lose minerals and strength with aging. This can result in bone fractures. If you are 65 years old or older, or if you are at risk for osteoporosis and fractures, ask your health care provider if you should: Be screened for bone loss. Take a calcium or vitamin D supplement to lower your risk of fractures. Be given hormone replacement therapy (HRT) to treat symptoms of menopause. Follow these instructions at home: Alcohol use Do not drink alcohol if: Your health care provider tells you not to drink. You are pregnant, may be pregnant, or are planning to become pregnant. If you drink alcohol: Limit how much you have to: 0-1 drink a day. Know how much alcohol is in your drink. In the [place] one drink equals one 12 oz bottle of beer (355 mL), one 5 oz glass of wine (148 mL), or one 1 oz glass of hard liquor (44 mL). Lifestyle Do not use any products that contain nicotine or tobacco. These products include cigarettes, chewing tobacco, and vaping devices,  such as e-cigarettes. If you need help quitting, ask your health care provider. Do not use street drugs. Do not share needles. Ask your health care provider for help if you need support or information about quitting drugs. General instructions Schedule regular health, dental, and eye exams. Stay current with your vaccines. Tell your health care provider if: You often feel depressed. You have ever been abused or do not feel safe at home. Summary Adopting a healthy lifestyle and getting preventive care are important in promoting health and wellness. Follow your health care provider's instructions about healthy diet, exercising, and getting tested or screened for diseases. Follow your health care provider's instructions on monitoring your cholesterol and blood pressure. This information is not intended to replace advice given to you by your health care provider.  Make sure you discuss any questions you have with your health care provider. Document Revised: 03/18/2021 Document Reviewed: 03/18/2021 Elsevier Patient Education  2024 [employer].

## 2023-06-23 NOTE — Progress Notes (Signed)
MEDICARE ANNUAL WELLNESS VISIT  06/23/2023  Telephone Visit Disclaimer This Medicare AWV was conducted by telephone due to national recommendations for restrictions regarding the COVID-19 Pandemic (e.g. social distancing).  I verified, using two identifiers, that I am speaking with Diane Gutierrez or their authorized healthcare agent. I discussed the limitations, risks, security, and privacy concerns of performing an evaluation and management service by telephone and the potential availability of an in-person appointment in the future. The patient expressed understanding and agreed to proceed.  Location of Patient: Home Location of Provider (nurse):  In the office.  Subjective:    Callee Druschel is a 57 y.o. female patient of Tamera Punt, Erika S, DO who had a Medicare Annual Wellness Visit today via telephone. [name] is Disabled and lives with their spouse. she has 3 children. she reports that she is socially active and does interact with friends/family regularly. she is moderately physically active and enjoys reading, spending time with her dog and crafts.  Patient Care Team: Charlton Amor, DO as PCP - General (Family Medicine) Corky Crafts, MD as PCP - Cardiology (Cardiology) Magda Kiel, MD (Obstetrics and Gynecology)     06/23/2023    2:54 PM 05/30/2023    8:22 PM 11/26/2022    3:58 PM 08/26/2022    4:45 PM 07/30/2022    9:21 PM 02/11/2022    3:51 PM 12/13/2021    1:45 PM  Advanced Directives  Does Patient Have a Medical Advance Directive? No No No No No No No  Does patient want to make changes to medical advance directive?       No - Patient declined  Would patient like information on creating a medical advance directive? No - Patient declined No - Patient declined  No - Patient declined       Hospital Utilization Over the Past 12 Months: # of hospitalizations or ER visits: 3 # of surgeries: 0  Review of Systems    Patient reports that her overall health  is unchanged compared to last year.  History obtained from chart review and the patient  Patient Reported Readings (BP, Pulse, CBG, Weight, etc) BP: 104/72 Weight: 116 lb Height: 73f5 Pulse: 73 SpO2- 99%  12th Pain Assessment Pain : 0-10 Pain Score: 3  Pain Type: Acute pain Pain Location: Abdomen Pain Descriptors / Indicators: Aching Pain Onset: More than a month ago Pain Frequency: Intermittent     Current Medications & Allergies (verified) Allergies as of 06/23/2023       Reactions   Prednisone Rash, Hives   RASH, severe Whelps and skin feels like it is on fire   Ibuprofen    GI UPSET Other Reaction(s): GI Intolerance        Medication List        Accurate as of June 23, 2023  3:18 PM. If you have any questions, ask your nurse or doctor.          ALLEGRA PO Take by mouth. PRN   BIOTIN PO Take 1 tablet by mouth daily.   fluticasone 50 MCG/ACT nasal spray Commonly known as: FLONASE Place 2 sprays into both nostrils daily.   metoprolol succinate 25 MG 24 hr tablet Commonly known as: TOPROL-XL Take 1 tablet (25 mg total) by mouth daily with or immediately following a meal.   nitroGLYCERIN 0.4 MG SL tablet Commonly known as: NITROSTAT PLACE ONE TABLET UNDER THE TONGUE AT ONSET OF CHEST PAIN. MAY REPEAT EVERY 5 MINUTES AS NEEDED  FOR CHEST PAIN UP TO 3 TABLETS IN 15 MINUTES. IF NO RELIEF AFTER 5 MINUTES, CALL 911.   omeprazole 20 MG capsule Commonly known as: PRILOSEC Take 1 capsule (20 mg total) by mouth daily. What changed: additional instructions   promethazine 12.5 MG tablet Commonly known as: PHENERGAN Take 1 tablet (12.5 mg total) by mouth every 6 (six) hours as needed for nausea or vomiting.   vitamin C 100 MG tablet Take 500 mg by mouth daily.        History (reviewed): Past Medical History:  Diagnosis Date   Abdominal pain    Acid reflux    Anxiety    Colicky RLQ abdominal pain 07/07/2019   Fibrocystic breast 07/10/2019    Hiatal hernia    Hyperlipidemia    Leukopenia    Low back pain    Lung nodule    Neck pain    Palpitations    Precordial pain 08/08/2021   Myoview 12/30/22: EF 69, no ischemia or infarction; low risk // TTE 12/30/22: EF 55-60, no RWMA, NL PASP, RAP 3   PVC (premature ventricular contraction) 06/22/2019   Monitor 01/2023: NSR, rare PACs and rare PVCs; single brief run of PVCs; no sustained arrhythmias   Seasonal allergies    Seizure (HCC)    childhood   Sneezing    SOB (shortness of breath)    Echo 10/22: EF 60-65, no RWMA, normal RVSF, mild MR   Tachycardia    Tachycardia 07/07/2019   Umbilical hernia    Weight gain    Past Surgical History:  Procedure Laterality Date   BREAST BIOPSY Left    BREAST SURGERY     lumpectomy, cyst aspirated.on left   CHOLECYSTECTOMY     COLONOSCOPY W/ POLYPECTOMY     HYSTEROSCOPY DIAGNOSTIC     JOINT REPLACEMENT Left    knee x 2 with screws in place   KNEE SURGERY     NECK SURGERY     Dr Noel Gerold   ROBOTIC ASSISTED TOTAL HYSTERECTOMY     precancerous, with BSO   Family History  Problem Relation Age of Onset   Arthritis Mother    Hearing loss Mother    Heart disease Mother        MI   Hyperlipidemia Mother    Thyroid cancer Mother    Hypertension Father    Heart disease Father        s/p CABG   Diabetes Father    Hyperlipidemia Father    Other Father    Drug abuse Sister        crack cocaine   Other Sister    Diabetes Sister        smoker   Bipolar disorder Sister    Heart disease Brother    Sleep apnea Brother    Arthritis Maternal Grandmother    Heart disease Maternal Grandmother    Arthritis Maternal Grandfather    Heart disease Maternal Grandfather    Heart disease Paternal Grandmother    Cancer Paternal Grandfather    Heart disease Paternal Grandfather    Colon cancer Neg Hx    Esophageal cancer Neg Hx    Rectal cancer Neg Hx    Stomach cancer Neg Hx    Social History   Socioeconomic History   Marital status:  Married    Spouse name: John   Number of children: 3   Years of education: 12   Highest education level: 12th grade  Occupational History   Occupation:  Disabled - due to neck injury  Tobacco Use   Smoking status: Never   Smokeless tobacco: Never  Vaping Use   Vaping status: Never Used  Substance and Sexual Activity   Alcohol use: Not Currently   Drug use: Never   Sexual activity: Not Currently  Other Topics Concern   Not on file  Social History Narrative   Lives with husband. She has two children and one step child. She enjoys reading, spending time with her dog and crafts.   Social Determinants of Health   Financial Resource Strain: Low Risk  (06/23/2023)   Overall Financial Resource Strain (CARDIA)    Difficulty of Paying Living Expenses: Not hard at all  Food Insecurity: No Food Insecurity (06/23/2023)   Hunger Vital Sign    Worried About Running Out of Food in the Last Year: Never true    Ran Out of Food in the Last Year: Never true  Transportation Needs: No Transportation Needs (06/23/2023)   PRAPARE - Administrator, Civil Service (Medical): No    Lack of Transportation (Non-Medical): No  Physical Activity: Inactive (06/23/2023)   Exercise Vital Sign    Days of Exercise per Week: 0 days    Minutes of Exercise per Session: 0 min  Stress: No Stress Concern Present (06/23/2023)   [employer] of Occupational Health - Occupational Stress Questionnaire    Feeling of Stress : Only a little  Social Connections: Moderately Isolated (06/23/2023)   Social Connection and Isolation Panel [NHANES]    Frequency of Communication with Friends and Family: More than three times a week    Frequency of Social Gatherings with Friends and Family: Never    Attends Religious Services: Never    Database administrator or Organizations: No    Attends Banker Meetings: Never    Marital Status: Married    Activities of Daily Living    06/23/2023    2:59 PM  In  your present state of health, do you have any difficulty performing the following activities:  Hearing? 0  Vision? 0  Difficulty concentrating or making decisions? 1  Comment some difficulty since her concussion  Walking or climbing stairs? 0  Dressing or bathing? 0  Doing errands, shopping? 0  Preparing Food and eating ? N  Using the Toilet? N  In the past six months, have you accidently leaked urine? Y  Do you have problems with loss of bowel control? N  Managing your Medications? N  Managing your Finances? N  Housekeeping or managing your Housekeeping? N    Patient Education/ Literacy How often do you need to have someone help you when you read instructions, pamphlets, or other written materials from your doctor or pharmacy?: 1 - Never What is the last grade level you completed in school?: 12th grade  Exercise    Diet Patient reports consuming 2 meals a day and 0 snack(s) a day Patient reports that her primary diet is: Regular Patient reports that she does have regular access to food.   Depression Screen    06/23/2023    2:55 PM 05/13/2023    2:50 PM 03/31/2023    2:34 PM 09/30/2022    4:48 PM 08/26/2022    4:45 PM 12/13/2021    1:51 PM 02/04/2021    2:09 PM  PHQ 2/9 Scores  PHQ - 2 Score 0 0 0 0 0 1 0  PHQ- 9 Score     7  Fall Risk    06/23/2023    2:55 PM 05/20/2023    3:05 PM 05/13/2023    2:50 PM 03/31/2023    2:34 PM 09/30/2022    4:48 PM  Fall Risk   Falls in the past year? 0 0 0 0 1  Number falls in past yr: 0 0 0 0 1  Injury with Fall? 0 0 0 0 0  Risk for fall due to : No Fall Risks No Fall Risks No Fall Risks No Fall Risks Impaired balance/gait  Follow up Falls evaluation completed Falls prevention discussed;Falls evaluation completed Falls evaluation completed Falls evaluation completed Falls evaluation completed;Education provided     Objective:  Salmah Birkett seemed alert and oriented and she participated appropriately during our telephone  visit.  Blood Pressure Weight BMI  BP Readings from Last 3 Encounters:  06/23/23 104/72  05/31/23 (!) 130/113  05/20/23 116/63   Wt Readings from Last 3 Encounters:  06/23/23 116 lb (52.6 kg)  05/30/23 116 lb 13.5 oz (53 kg)  05/20/23 117 lb 12.8 oz (53.4 kg)   BMI Readings from Last 1 Encounters:  06/23/23 19.30 kg/m    *Unable to obtain current vital signs, weight, and BMI due to telephone visit type  Hearing/Vision  Catriona did not seem to have difficulty with hearing/understanding during the telephone conversation Reports that she has had a formal eye exam by an eye care professional within the past year Reports that she has not had a formal hearing evaluation within the past year *Unable to fully assess hearing and vision during telephone visit type  Cognitive Function:    06/23/2023    3:07 PM  6CIT Screen  What Year? 0 points  What month? 0 points  What time? 0 points   (Normal:0-7, Significant for Dysfunction: >8)  Normal Cognitive Function Screening: Yes   Immunization & Health Maintenance Record  There is no immunization history on file for this patient.  Health Maintenance  Topic Date Due   DTaP/Tdap/Td (1 - Tdap) Never done   Colonoscopy  09/27/2023   COVID-19 Vaccine (1) 07/09/2023 (Originally 03/21/1971)   INFLUENZA VACCINE  02/08/2024 (Originally 06/11/2023)   Hepatitis C Screening  06/22/2024 (Originally [birth year]20/1984)   HIV Screening  06/22/2024 (Originally [birth year]20/1981)   Medicare Annual Wellness (AWV)  06/22/2024   MAMMOGRAM  09/17/2024   HPV VACCINES  Aged Out   PAP SMEAR-Modifier  Discontinued   Zoster Vaccines- Shingrix  Discontinued       Assessment  This is a routine wellness examination for [employer].  Health Maintenance: Due or Overdue Health Maintenance Due  Topic Date Due   DTaP/Tdap/Td (1 - Tdap) Never done   Colonoscopy  09/27/2023    Diane Gutierrez does not need a referral for Community Assistance: Care  Management:   no Social Work:    no Prescription Assistance:  no Nutrition/Diabetes Education:  no   Plan:  Personalized Goals  Goals Addressed               This Visit's Progress     Patient Stated (pt-stated)        Patient stated that she would like to get her stomach "straightened out".       Personalized Health Maintenance & Screening Recommendations  Td vaccine Screening mammography Colorectal cancer screening - due November Influenza vaccine - declined  Patient declined td vaccine and influenza vaccine.   Lung Cancer Screening Recommended: no (Low Dose CT Chest recommended if Age  50-80 years, 20 pack-year currently smoking OR have quit w/in past 15 years) Hepatitis C Screening recommended: yes HIV Screening recommended: yes  Advanced Directives: Written information was not prepared per patient's request.  Referrals & Orders No orders of the defined types were placed in this encounter.   Follow-up Plan Follow-up with Charlton Amor, DO as planned Please call and schedule your mammogram for November.  Please call and schedule your colonoscopy for November.  Medicare wellness visit in one year.  AVS printed and mailed to the patient.    I have personally reviewed and noted the following in the patient's chart:   Medical and social history Use of alcohol, tobacco or illicit drugs  Current medications and supplements Functional ability and status Nutritional status Physical activity Advanced directives List of other physicians Hospitalizations, surgeries, and ER visits in previous 12 months Vitals Screenings to include cognitive, depression, and falls Referrals and appointments  In addition, I have reviewed and discussed with Diane Gutierrez certain preventive protocols, quality metrics, and best practice recommendations. A written personalized care plan for preventive services as well as general preventive health recommendations is available and  can be mailed to the patient at her request.      Modesto Charon, RN BSN  06/23/2023

## 2023-07-15 ENCOUNTER — Encounter: Payer: Medicare Other | Admitting: Obstetrics and Gynecology

## 2023-08-05 ENCOUNTER — Ambulatory Visit: Payer: Medicare Other | Admitting: Internal Medicine

## 2023-08-12 ENCOUNTER — Other Ambulatory Visit (HOSPITAL_BASED_OUTPATIENT_CLINIC_OR_DEPARTMENT_OTHER): Payer: Self-pay

## 2023-08-12 ENCOUNTER — Ambulatory Visit: Payer: Medicare Other | Admitting: Obstetrics and Gynecology

## 2023-08-12 ENCOUNTER — Encounter: Payer: Self-pay | Admitting: Obstetrics and Gynecology

## 2023-08-12 VITALS — BP 107/66 | HR 62 | Ht 65.0 in | Wt 118.0 lb

## 2023-08-12 DIAGNOSIS — N8110 Cystocele, unspecified: Secondary | ICD-10-CM | POA: Diagnosis not present

## 2023-08-12 DIAGNOSIS — N898 Other specified noninflammatory disorders of vagina: Secondary | ICD-10-CM

## 2023-08-12 DIAGNOSIS — Z800 Family history of malignant neoplasm of digestive organs: Secondary | ICD-10-CM

## 2023-08-12 MED ORDER — ESTRADIOL 0.1 MG/GM VA CREA
TOPICAL_CREAM | VAGINAL | 12 refills | Status: DC
Start: 2023-08-12 — End: 2024-02-18
  Filled 2023-08-12: qty 42.5, 30d supply, fill #0

## 2023-08-12 NOTE — Progress Notes (Signed)
PCP ordered Mammo

## 2023-08-12 NOTE — Progress Notes (Signed)
GYNECOLOGY OFFICE VISIT NOTE  History:   Diane Gutierrez is a 57 y.o. [phone] here today for prolapse and vaginal dryness.   Discussed the use of AI scribe software for clinical note transcription with the patient, who gave verbal consent to proceed.  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 The patient reports that the prolapse sensation started around 2021 and has progressively worsened. The patient also mentions a history of colonoscopy two years ago, which revealed a tortuous colon and cancerous polyp. The patient reports frequent urination and occasional urinary incontinence. The patient also mentions a history of yeast and urinary tract infections since having a hysterectomy. She reports she cannot have intercourse due to the dryness and prolapse.   The patient also reports a history of breast lumps and a recent knot in the breast. The patient had a biopsy two years ago, which resulted in trauma and the formation of a knot. The patient is scheduled for a mammogram in November with [facility].   The patient also mentions a family history of colon cancer and a personal history of complex atypical hyperplasia, which raises concerns about a possible genetic predisposition to cancer.        Past Medical History:  Diagnosis Date   Abdominal pain    Acid reflux    Anxiety    Colicky RLQ abdominal pain 07/07/2019   Fibrocystic breast 07/10/2019   Hiatal hernia    Hyperlipidemia    Leukopenia    Low back pain    Lung nodule    Neck pain    Palpitations    Precordial pain 08/08/2021   Myoview 12/30/22: EF 69, no ischemia or infarction; low risk // TTE 12/30/22: EF 55-60, no RWMA, NL PASP, RAP 3   PVC (premature ventricular contraction) 06/22/2019   Monitor 01/2023: NSR, rare PACs and rare PVCs; single brief run of PVCs; no  sustained arrhythmias   Seasonal allergies    Seizure (HCC)    childhood   Sneezing    SOB (shortness of breath)    Echo 10/22: EF 60-65, no RWMA, normal RVSF, mild MR   Tachycardia    Tachycardia 07/07/2019   Umbilical hernia    Weight gain     Past Surgical History:  Procedure Laterality Date   BREAST BIOPSY Left    BREAST SURGERY     lumpectomy, cyst aspirated.on left   CHOLECYSTECTOMY     COLONOSCOPY W/ POLYPECTOMY     HYSTEROSCOPY DIAGNOSTIC     JOINT REPLACEMENT Left    knee x 2 with screws in place   KNEE SURGERY     NECK SURGERY     Dr Noel Gerold   ROBOTIC ASSISTED TOTAL HYSTERECTOMY  2021   For CAH, with BSO, [facility]    The following portions of the patient's history were reviewed and updated as appropriate: allergies, current medications, past family history, past medical history, past social history, past surgical history and problem list.   Review of Systems:  Pertinent items noted in HPI and remainder of comprehensive ROS otherwise negative.  Physical Exam:  BP 107/66   Pulse 62   Ht 5\' 5"  (1.651 m)   Wt 118 lb (53.5 kg)   BMI 19.64 kg/m  CONSTITUTIONAL: Well-developed, well-nourished female in no acute distress.  HEENT:  Normocephalic, atraumatic. External right and left ear normal.  No scleral icterus.  NECK: Normal range of motion, supple, no masses noted on observation SKIN: No rash noted. Not diaphoretic. No erythema. No pallor. MUSCULOSKELETAL: Normal range of motion. No edema noted. NEUROLOGIC: Alert and oriented to person, place, and time. Normal muscle tone coordination. No cranial nerve deficit noted. PSYCHIATRIC: Normal mood and affect. Normal behavior. Normal judgment and thought content.  Breasts: breasts appear normal, no suspicious masses, no skin or nipple changes or axillary nodes. Evidence of trauma with palpable nodularity in the right breast from where injury occurred.   PELVIC: Normal appearing external genitalia; normal urethral  meatus; normal appearing vaginal mucosa except atrophic. Has anterior vaginal wall prolapse that extends to 1 cm beyond the hymen with minimal valsalva.  No abnormal discharge noted.  Performed in the presence of a chaperone  Labs and Imaging No results found for this or any previous visit (from the past 168 hour(s)). No results found.  Assessment and Plan:   1. Prolapse of anterior vaginal wall Noted significant bladder prolapse with associated urinary incontinence. Patient reports worsening symptoms since last seen by Urogynecologist (Dr. Florian Buff) -Refer to Urogynecologist for further evaluation and potential surgical intervention. - Ambulatory referral to Urogynecology  2. Family history of colon cancer Noted complex family history of various cancers including colon cancer in biological father. Patient herself has a history of complex atypical hyperplasia and breast masses. -Consider genetic testing for Lynch syndrome and potentially other genetic mutations associated with cancer.  - Ambulatory referral to Genetics  3. Vaginal dryness Patient reports significant vaginal dryness and discomfort, likely secondary to postmenopausal status. -Start vaginal estrogen therapy, nightly for two weeks then twice weekly thereafter. - estradiol (ESTRACE) 0.1 MG/GM vaginal cream; Apply 1 gram per vagina every night for 2 weeks, then apply 1-3  times a week  Dispense: 42.5 g; Refill: 12 - Ambulatory referral to Urogynecology    Meds ordered this encounter  Medications   estradiol (ESTRACE) 0.1 MG/GM vaginal cream    Sig: Apply 1 gram per vagina every night for 2 weeks, then apply 1-3  times a week    Dispense:  42.5 g    Refill:  12     Routine preventative health maintenance measures emphasized. Please refer to After Visit Summary for other counseling recommendations.   Return if symptoms worsen or fail to improve.  Milas Hock, MD, FACOG Obstetrician & Gynecologist, [facility] for [facility], [facility]

## 2023-08-22 ENCOUNTER — Telehealth: Payer: Self-pay | Admitting: Genetic Counselor

## 2023-08-22 NOTE — Telephone Encounter (Signed)
Called to FU with patient in regards to getting [facility] appointment scheduled per referral. Patient is still unsure at this time.

## 2023-08-26 ENCOUNTER — Other Ambulatory Visit: Payer: Self-pay | Admitting: Interventional Cardiology

## 2023-08-26 ENCOUNTER — Other Ambulatory Visit (HOSPITAL_BASED_OUTPATIENT_CLINIC_OR_DEPARTMENT_OTHER): Payer: Self-pay

## 2023-08-26 MED ORDER — METOPROLOL SUCCINATE ER 25 MG PO TB24
25.0000 mg | ORAL_TABLET | Freq: Every day | ORAL | 3 refills | Status: DC
  Filled 2023-08-26: qty 90, 90d supply, fill #0
  Filled 2023-12-01: qty 90, 90d supply, fill #1
  Filled 2024-02-23: qty 90, 90d supply, fill #2
  Filled 2024-05-23: qty 90, 90d supply, fill #3

## 2023-09-25 ENCOUNTER — Telehealth: Payer: Self-pay | Admitting: Family Medicine

## 2023-09-25 NOTE — Telephone Encounter (Signed)
Patient is requesting labs before her appointment.

## 2023-09-28 ENCOUNTER — Other Ambulatory Visit: Payer: Self-pay

## 2023-09-28 ENCOUNTER — Other Ambulatory Visit: Payer: Self-pay | Admitting: Family Medicine

## 2023-09-28 DIAGNOSIS — R000 Tachycardia, unspecified: Secondary | ICD-10-CM

## 2023-09-28 DIAGNOSIS — E782 Mixed hyperlipidemia: Secondary | ICD-10-CM

## 2023-10-01 ENCOUNTER — Encounter: Payer: Self-pay | Admitting: Family Medicine

## 2023-10-01 ENCOUNTER — Other Ambulatory Visit (HOSPITAL_BASED_OUTPATIENT_CLINIC_OR_DEPARTMENT_OTHER): Payer: Self-pay

## 2023-10-01 ENCOUNTER — Ambulatory Visit (INDEPENDENT_AMBULATORY_CARE_PROVIDER_SITE_OTHER): Payer: Medicare Other | Admitting: Family Medicine

## 2023-10-01 VITALS — BP 115/70 | HR 79 | Ht 65.0 in | Wt 122.0 lb

## 2023-10-01 DIAGNOSIS — R1013 Epigastric pain: Secondary | ICD-10-CM | POA: Diagnosis not present

## 2023-10-01 DIAGNOSIS — R1084 Generalized abdominal pain: Secondary | ICD-10-CM | POA: Diagnosis not present

## 2023-10-01 DIAGNOSIS — Z0000 Encounter for general adult medical examination without abnormal findings: Secondary | ICD-10-CM | POA: Diagnosis not present

## 2023-10-01 MED ORDER — OMEPRAZOLE 20 MG PO CPDR
20.0000 mg | DELAYED_RELEASE_CAPSULE | Freq: Every day | ORAL | 1 refills | Status: DC
  Filled 2023-10-01: qty 30, 30d supply, fill #0

## 2023-10-01 MED ORDER — PANTOPRAZOLE SODIUM 40 MG PO TBEC
40.0000 mg | DELAYED_RELEASE_TABLET | Freq: Every day | ORAL | 0 refills | Status: DC
  Filled 2023-10-01: qty 30, 30d supply, fill #0

## 2023-10-01 NOTE — Assessment & Plan Note (Signed)
Pt also notes generalized abdominal pain as well that starts on the left side. Tenderness to palpation of lower quadrants on exam. Pt does have some rectal cramping and does have a hx of 10mm polyp that was removed. Father recently passed from colon cancer. Will order CT abd/pelvis Pt has tried to get back in with GI because she is due for her colonoscopy- we will place a new referral to help the process.

## 2023-10-01 NOTE — Assessment & Plan Note (Signed)
Will go ahead and do a trial of omeprazole as this sounds a lot like a gastric ulcer. Given trial of omeprazole to see if this resolves.

## 2023-10-01 NOTE — Progress Notes (Signed)
Established patient visit   Patient: Diane Gutierrez   DOB: 1966/04/26   57 y.o. Female  MRN: [id number] Visit Date: 10/01/2023  Today's healthcare provider: Charlton Amor, DO   Chief Complaint  Patient presents with   Annual Exam    SUBJECTIVE    Chief Complaint  Patient presents with   Annual Exam   HPI  Pt presents for wellness exam today.   Is also having concerns of stomach pain. Has had a few deaths in the family recently, including her biological father, that has been very hard for her to handle. She notes a gnawing pain in her epigastric region and says she has not been able to eat normally. Also notes some lower abdominal pain. Denies any bowel habit changes. Says that the pain started on the left side. Endorses worsening of pain with eating.   Review of Systems  Constitutional:  Negative for activity change, fatigue and fever.  Respiratory:  Negative for cough and shortness of breath.   Cardiovascular:  Negative for chest pain.  Gastrointestinal:  Positive for abdominal pain.  Genitourinary:  Negative for difficulty urinating.       Current Meds  Medication Sig   Ascorbic Acid (VITAMIN C) 100 MG tablet Take 500 mg by mouth daily.   BIOTIN PO Take 1 tablet by mouth daily.   estradiol (ESTRACE) 0.1 MG/GM vaginal cream Apply 1 gram per vagina every night for 2 weeks, then apply 1-3  times a week   Fexofenadine HCl (ALLEGRA PO) Take by mouth. PRN   fluticasone (FLONASE) 50 MCG/ACT nasal spray Place 2 sprays into both nostrils daily.   metoprolol succinate (TOPROL-XL) 25 MG 24 hr tablet Take 1 tablet (25 mg total) by mouth daily with or immediately following a meal.   nitroGLYCERIN (NITROSTAT) 0.4 MG SL tablet PLACE ONE TABLET UNDER THE TONGUE AT ONSET OF CHEST PAIN. MAY REPEAT EVERY 5 MINUTES AS NEEDED FOR CHEST PAIN UP TO 3 TABLETS IN 15 MINUTES. IF NO RELIEF AFTER 5 MINUTES, CALL 911.   omeprazole (PRILOSEC) 20 MG capsule Take 1 capsule (20 mg total) by  mouth daily.   promethazine (PHENERGAN) 12.5 MG tablet Take 1 tablet (12.5 mg total) by mouth every 6 (six) hours as needed for nausea or vomiting.   [DISCONTINUED] omeprazole (PRILOSEC) 20 MG capsule Take 1 capsule (20 mg total) by mouth daily. (Patient taking differently: Take 20 mg by mouth daily. As needed)   [DISCONTINUED] pantoprazole (PROTONIX) 40 MG tablet Take 1 tablet (40 mg total) by mouth daily.    OBJECTIVE    BP 115/70 (BP Location: Left Arm, Patient Position: Sitting, Cuff Size: Normal)   Pulse 79   Ht 5\' 5"  (1.651 m)   Wt 122 lb (55.3 kg)   SpO2 100%   BMI 20.30 kg/m   Physical Exam Vitals and nursing note reviewed.  Constitutional:      General: She is not in acute distress.    Appearance: Normal appearance.  HENT:     Head: Normocephalic and atraumatic.     Right Ear: External ear normal.     Left Ear: External ear normal.     Nose: Nose normal.  Eyes:     Conjunctiva/sclera: Conjunctivae normal.  Cardiovascular:     Rate and Rhythm: Normal rate and regular rhythm.  Pulmonary:     Effort: Pulmonary effort is normal.     Breath sounds: Normal breath sounds.  Abdominal:     General: Bowel  sounds are normal.     Comments: Tenderness to palpation of epigastric area and lower quadrants  Neurological:     General: No focal deficit present.     Mental Status: She is alert and oriented to person, place, and time.  Psychiatric:        Mood and Affect: Mood normal.        Behavior: Behavior normal.        Thought Content: Thought content normal.        Judgment: Judgment normal.        ASSESSMENT & PLAN    Problem List Items Addressed This Visit       Other   Routine adult health maintenance - Primary   Epigastric pain    Will go ahead and do a trial of omeprazole as this sounds a lot like a gastric ulcer. Given trial of omeprazole to see if this resolves.        Generalized abdominal pain    Pt also notes generalized abdominal pain as well that  starts on the left side. Tenderness to palpation of lower quadrants on exam. Pt does have some rectal cramping and does have a hx of 10mm polyp that was removed. Father recently passed from colon cancer. Will order CT abd/pelvis Pt has tried to get back in with GI because she is due for her colonoscopy- we will place a new referral to help the process.       Relevant Orders   CT ABDOMEN PELVIS W CONTRAST    No follow-ups on file.      Meds ordered this encounter  Medications   DISCONTD: pantoprazole (PROTONIX) 40 MG tablet    Sig: Take 1 tablet (40 mg total) by mouth daily.    Dispense:  30 tablet    Refill:  0   omeprazole (PRILOSEC) 20 MG capsule    Sig: Take 1 capsule (20 mg total) by mouth daily.    Dispense:  30 capsule    Refill:  1    Pt prefers brand name    Orders Placed This Encounter  Procedures   CT ABDOMEN PELVIS W CONTRAST    Standing Status:   Future    Standing Expiration Date:   09/30/2024    Order Specific Question:   If indicated for the ordered procedure, I authorize the administration of contrast media per Radiology protocol    Answer:   Yes    Order Specific Question:   Does the patient have a contrast media/X-ray dye allergy?    Answer:   No    Order Specific Question:   Is patient pregnant?    Answer:   No    Order Specific Question:   Preferred imaging location?    Answer:   [facility]    Order Specific Question:   If indicated for the ordered procedure, I authorize the administration of oral contrast media per Radiology protocol    Answer:   Yes     Charlton Amor, DO  [facility] at [facility] [phone] (phone) [phone] (fax)  [facility]

## 2023-10-02 ENCOUNTER — Telehealth: Payer: Self-pay | Admitting: Genetic Counselor

## 2023-10-02 ENCOUNTER — Encounter: Payer: Self-pay | Admitting: Genetic Counselor

## 2023-10-02 NOTE — Telephone Encounter (Signed)
Patient called to cance appointment.  She did not make the appointment so was surprised that she had an appointment.  She will call back in January to r/s appointment.

## 2023-10-06 ENCOUNTER — Inpatient Hospital Stay: Payer: Medicare Other | Admitting: Genetic Counselor

## 2023-10-06 ENCOUNTER — Inpatient Hospital Stay: Payer: Medicare Other

## 2023-10-12 ENCOUNTER — Other Ambulatory Visit (HOSPITAL_BASED_OUTPATIENT_CLINIC_OR_DEPARTMENT_OTHER): Payer: Self-pay

## 2023-10-16 ENCOUNTER — Encounter: Payer: Self-pay | Admitting: Family Medicine

## 2023-10-16 ENCOUNTER — Telehealth (HOSPITAL_BASED_OUTPATIENT_CLINIC_OR_DEPARTMENT_OTHER): Payer: Self-pay | Admitting: Family Medicine

## 2023-10-19 ENCOUNTER — Other Ambulatory Visit: Payer: Self-pay | Admitting: Family Medicine

## 2023-10-19 DIAGNOSIS — R140 Abdominal distension (gaseous): Secondary | ICD-10-CM

## 2023-10-19 DIAGNOSIS — R1084 Generalized abdominal pain: Secondary | ICD-10-CM

## 2023-10-20 DIAGNOSIS — N644 Mastodynia: Secondary | ICD-10-CM | POA: Diagnosis not present

## 2023-10-20 DIAGNOSIS — N6325 Unspecified lump in the left breast, overlapping quadrants: Secondary | ICD-10-CM | POA: Diagnosis not present

## 2023-10-20 DIAGNOSIS — N6315 Unspecified lump in the right breast, overlapping quadrants: Secondary | ICD-10-CM | POA: Diagnosis not present

## 2023-10-20 LAB — HM MAMMOGRAPHY

## 2023-10-28 ENCOUNTER — Telehealth: Payer: Self-pay | Admitting: Gastroenterology

## 2023-10-28 NOTE — Telephone Encounter (Signed)
Good morning Dr. Meridee Score,     Supervising MD AM  12/18     We Received a call from a patient wishing to schedule an appointment with Dr. Adela Lank. Patient has been seen by other providers out side of the [facility] systems. Patient stated she was a patient of Dr. Rhea Belton and because she went out of the [facility] system Dr. Rhea Belton did not wish to see her. In her chart there was a transfer of care done with Dr. Chales Abrahams. Would you please advise on scheduling?     Thank you.

## 2023-10-28 NOTE — Telephone Encounter (Addendum)
Request received to transfer GI care from outside practice to [facility].  We appreciate the interest in our practice, however at this time due to high demand from patients without established GI providers we cannot accommodate this transfer.  Ability to accommodate future transfer requests may change over time and the patient can contact us again in 6-12 months if still interested in being seen at [facility].  She has already had 2 GI evaluations at quaternary centers.

## 2023-10-28 NOTE — Telephone Encounter (Signed)
Error

## 2023-11-05 ENCOUNTER — Telehealth: Payer: Self-pay

## 2023-11-05 NOTE — Telephone Encounter (Signed)
Patient advised.

## 2023-11-05 NOTE — Telephone Encounter (Signed)
Copied from CRM [id number]. Topic: Referral - Question >> Nov 05, 2023  3:10 PM Alvino Blood C wrote: Reason for CRM:  PT is requesting a call back from "Tiffany" at [phone] to discuss her gastric referral

## 2023-11-06 ENCOUNTER — Other Ambulatory Visit: Payer: Self-pay | Admitting: Family Medicine

## 2023-11-06 DIAGNOSIS — R140 Abdominal distension (gaseous): Secondary | ICD-10-CM

## 2023-11-06 DIAGNOSIS — R1013 Epigastric pain: Secondary | ICD-10-CM

## 2023-11-06 DIAGNOSIS — R1084 Generalized abdominal pain: Secondary | ICD-10-CM

## 2023-11-06 NOTE — Telephone Encounter (Signed)
Spoke with the pt and she is wishing to go to [facility] for further work up, but is being told by [facility] that the transfer has to be approved by her previous Chartered loss adjuster. Pls put in a new referral to [facility] thank you. Roselyn Reef, CMA

## 2023-11-09 ENCOUNTER — Other Ambulatory Visit: Payer: Medicare Other

## 2023-11-13 ENCOUNTER — Ambulatory Visit: Payer: Medicare Other | Admitting: Obstetrics and Gynecology

## 2023-11-13 ENCOUNTER — Encounter: Payer: Self-pay | Admitting: Obstetrics and Gynecology

## 2023-11-13 VITALS — BP 110/74 | HR 73

## 2023-11-13 DIAGNOSIS — N3281 Overactive bladder: Secondary | ICD-10-CM

## 2023-11-13 DIAGNOSIS — N993 Prolapse of vaginal vault after hysterectomy: Secondary | ICD-10-CM

## 2023-11-13 DIAGNOSIS — N952 Postmenopausal atrophic vaginitis: Secondary | ICD-10-CM

## 2023-11-13 DIAGNOSIS — N393 Stress incontinence (female) (male): Secondary | ICD-10-CM

## 2023-11-13 NOTE — Progress Notes (Signed)
 [facility] Return Visit  SUBJECTIVE  History of Present Illness: Ivalee Strauser is a 58 y.o. female seen in follow-up for prolapse and vaginal dryness.  Saw Dr Cleatus and was prescribed vaginal estrogen. Has been using replens because she is hesitant about the vaginal estrogen due to impending testing for abdominal bloating. Having some pain with insertion due to the prolapse. She is using it twice a week. She feels the tissue is tearing. Has not been able to have intercourse.   Has bloating and abdominal discomfort and has a CT scan ordered. She also is scheduled for a colonoscopy.   Has had constant leakage- with bending or movement. Has urgency and uses the bathroom more than once an hour and several times per night (5-7 times).  Had not done the pelvic PT due to deaths in the family.   Past Medical History: Patient  has a past medical history of Abdominal pain, Acid reflux, Anxiety, Colicky RLQ abdominal pain (07/07/2019), Fibrocystic breast (07/10/2019), Hiatal hernia, Hyperlipidemia, Leukopenia, Low back pain, Lung nodule, Neck pain, Palpitations, Precordial pain (08/08/2021), PVC (premature ventricular contraction) (06/22/2019), Seasonal allergies, Seizure (HCC), Sneezing, SOB (shortness of breath), Tachycardia, Tachycardia (07/07/2019), Umbilical hernia, and Weight gain.   Past Surgical History: She  has a past surgical history that includes Breast surgery; Cholecystectomy; Joint replacement (Left); Neck surgery; Hysteroscopy diagnostic; Breast biopsy (Left); Robotic assisted total hysterectomy (2021); Knee surgery; and Colonoscopy w/ polypectomy.   Medications: She has a current medication list which includes the following prescription(s): vitamin c, biotin, estradiol , fexofenadine hcl, fluticasone , metoprolol  succinate, nitroglycerin , omeprazole , and promethazine .   Allergies: Patient is allergic to prednisone and ibuprofen.   Social History: Patient   reports that she has never smoked. She has never used smokeless tobacco. She reports that she does not currently use alcohol. She reports that she does not use drugs.      OBJECTIVE     Physical Exam: Vitals:   11/13/23 1329  BP: 110/74  Pulse: 73    Gen: No apparent distress, A&O x 3.  Detailed Urogynecologic Evaluation:  Normal external genitalia with atrophy. Speculum exam revealed no lesions. No masses on bimanual. Tenderness in pelvic floor muscles bilaterally.   POP-Q  0                                            Aa   0                                           Ba  -3                                              C   4                                            Gh  3  Pb  4.5                                            tvl   -2.5                                            Ap  -2.5                                            Bp                                                 D      ASSESSMENT AND PLAN    Ms. Fessel is a 58 y.o. 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 SUI (stress urinary incontinence, female)   3. Overactive bladder   4. Vaginal atrophy     Prolapse - She is interested in proceeding with surgery. We discussed two options for prolapse repair:  1) vaginal repair without mesh - Pros - safer, no mesh complications - Cons - not as strong as mesh repair, higher risk of recurrence  2) laparoscopic repair with mesh - Pros - stronger, better long-term success - Cons - risks of mesh implant (erosion into vagina or bladder, adhering to the rectum, pain) - these risks are lower than with a vaginal mesh but still exist - Do not recommend mesh due to her pelvic pain/ dyspareunia.  - Will plan for anterior and posterior repair, sacrospinous ligament fixation, cystoscopy  2. Incontinence - For SUI symptoms, we discussed option of sling or urethral bulking. She prefers urethral bulking. Will  have her return for urodynamic testing to demonstrate leakage.  - Reviewed options for OAB but she is not interested in starting a medication at this time.   4. Vaginal atrophy - discussed use of vaginal estrogen twice a week. She wants to wait until she has a normal CT scan. Risks are very low with vaginal estrogen use due to the very low systemic absorption rate of ~ 0.01% with a twice-week regimen.  Follow up for urodynamic testing  Rosaline LOISE Caper, MD

## 2023-11-13 NOTE — Patient Instructions (Signed)

## 2023-11-16 ENCOUNTER — Other Ambulatory Visit: Payer: Medicare Other

## 2023-11-16 NOTE — Progress Notes (Deleted)
 I saw Diane Gutierrez in neurology clinic on 11/25/23 in follow up for numbness and tingling in her legs and hands and weakness.  HPI: Diane Gutierrez is a 58 y.o. year old female with a history of *** who we last saw on ***.  To briefly review: ***  Most recent Assessment and Plan (05/20/23): Diane Gutierrez is a 58 y.o. female who presents for evaluation of numbness and tingling in legs and hands and weakness. 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 Her neurological examination is pertinent for hyperflexia, mild left side weakness, and give way weakness. Available diagnostic data is significant for normal MRI brain in 2022 and MRI cervical spine with post op changes and mild canal stenosis with moderate foraminal stenosis at right C4-5 and bilateral C3-4. The etiology of patient's symptoms is unclear. She is concerned for CMT or MS. Her MRI brain and cervical spine just a couple of years ago, after symptom onset does not support this diagnosis, though these were done without contrast. I would expect an earlier age of onset of MS as well. Regarding CMT or any neuropathy, patient has more upper motor neuron signs with hyperreflexia than lower motor neuron as would be expected with CMT. This may be due to cervical spine disease, but a neuropathy could also be masked by this. I will get lab work and EMG to further clarify and look for treatable causes. Repeat imaging may be needed if another cause is not found.   PLAN: -Blood work: CK, HbA1c, B12, IFE, B1, ESR, CRP, vit E, copper -EMG: PN protocol (L > R) -May consider repeating MRI of brain and cervical spine w/wo contrast pending results of EMG  Since their last visit: ***  Patient did not get her labs drawn. EMG was not scheduled***  ROS: Pertinent positive and negative systems reviewed in HPI. ***   MEDICATIONS:  Outpatient Encounter  Medications as of 11/25/2023  Medication Sig   Ascorbic Acid (VITAMIN C) 100 MG tablet Take 500 mg by mouth daily.   BIOTIN PO Take 1 tablet by mouth daily.   estradiol  (ESTRACE ) 0.1 MG/GM vaginal cream Apply 1 gram per vagina every night for 2 weeks, then apply 1-3  times a week   Fexofenadine HCl (ALLEGRA PO) Take by mouth. PRN   fluticasone  (FLONASE ) 50 MCG/ACT nasal spray Place 2 sprays into both nostrils daily.   metoprolol  succinate (TOPROL -XL) 25 MG 24 hr tablet Take 1 tablet (25 mg total) by mouth daily with or immediately following a meal.   nitroGLYCERIN  (NITROSTAT ) 0.4 MG SL tablet PLACE ONE TABLET UNDER THE TONGUE AT ONSET OF CHEST PAIN. MAY REPEAT EVERY 5 MINUTES AS NEEDED FOR CHEST PAIN UP TO 3 TABLETS IN 15 MINUTES. IF NO RELIEF AFTER 5 MINUTES, CALL 911.   omeprazole  (PRILOSEC) 20 MG capsule Take 1 capsule (20 mg total) by mouth daily.   promethazine  (PHENERGAN ) 12.5 MG tablet Take 1 tablet (12.5 mg total) by mouth every 6 (six) hours as needed for nausea or vomiting.   No facility-administered encounter medications on file as of 11/25/2023.    PAST MEDICAL HISTORY: Past Medical History:  Diagnosis Date   Abdominal pain    Acid reflux    Anxiety    Colicky RLQ abdominal pain 07/07/2019   Fibrocystic breast 07/10/2019   Hiatal hernia    Hyperlipidemia    Leukopenia    Low back pain  Lung nodule    Neck pain    Palpitations    Precordial pain 08/08/2021   Myoview  12/30/22: EF 69, no ischemia or infarction; low risk // TTE 12/30/22: EF 55-60, no RWMA, NL PASP, RAP 3   PVC (premature ventricular contraction) 06/22/2019   Monitor 01/2023: NSR, rare PACs and rare PVCs; single brief run of PVCs; no sustained arrhythmias   Seasonal allergies    Seizure (HCC)    childhood   Sneezing    SOB (shortness of breath)    Echo 10/22: EF 60-65, no RWMA, normal RVSF, mild MR   Tachycardia    Tachycardia 07/07/2019   Umbilical hernia    Weight gain     PAST SURGICAL  HISTORY: Past Surgical History:  Procedure Laterality Date   BREAST BIOPSY Left    BREAST SURGERY     lumpectomy, cyst aspirated.on left   CHOLECYSTECTOMY     COLONOSCOPY W/ POLYPECTOMY     HYSTEROSCOPY DIAGNOSTIC     JOINT REPLACEMENT Left    knee x 2 with screws in place   KNEE SURGERY     NECK SURGERY     Dr Faylene Hoots   ROBOTIC ASSISTED TOTAL HYSTERECTOMY  2021   For CAH, with BSO, [facility]    ALLERGIES: Allergies  Allergen Reactions   Prednisone Rash and Hives    RASH, severe  Whelps and skin feels like it is on fire   Ibuprofen     GI UPSET  Other Reaction(s): GI Intolerance    FAMILY HISTORY: Family History  Problem Relation Age of Onset   Arthritis Mother    Hearing loss Mother    Heart disease Mother        MI   Hyperlipidemia Mother    Thyroid  cancer Mother    Hypertension Father    Heart disease Father        s/p CABG   Diabetes Father    Hyperlipidemia Father    Other Father    Colon cancer Father    Drug abuse Sister        crack cocaine   Other Sister    Diabetes Sister        smoker   Bipolar disorder Sister    Breast cancer Sister        Genetic testing, ? results   Heart disease Brother    Sleep apnea Brother    Arthritis Maternal Grandmother    Heart disease Maternal Grandmother    Arthritis Maternal Grandfather    Heart disease Maternal Grandfather    Heart disease Paternal Grandmother    Cervical cancer Paternal Grandmother    Cancer Paternal Grandfather    Heart disease Paternal Grandfather    Heart disease Paternal Aunt    Esophageal cancer Neg Hx    Rectal cancer Neg Hx    Stomach cancer Neg Hx     SOCIAL HISTORY: Social History   Tobacco Use   Smoking status: Never   Smokeless tobacco: Never  Vaping Use   Vaping status: Never Used  Substance Use Topics   Alcohol use: Not Currently   Drug use: Never   Social History   Social History Narrative   Lives with husband. She has two children and one step child. She  enjoys reading, spending time with her dog and crafts.    Objective:  Vital Signs:  There were no vitals taken for this visit.  General:*** General appearance: Awake and alert. No distress. Cooperative with exam.  Skin: No obvious rash or jaundice. HEENT: Atraumatic. Anicteric. Lungs: Non-labored breathing on room air  Heart: Regular Abdomen: Soft, non tender. Extremities: No edema. No obvious deformity.  Musculoskeletal: No obvious joint swelling.  Neurological: Mental Status: Alert. Speech fluent. No pseudobulbar affect Cranial Nerves: CNII: No RAPD. Visual fields intact. CNIII, IV, VI: PERRL. No nystagmus. EOMI. CN V: Facial sensation intact bilaterally to fine touch. Masseter clench strong. Jaw jerk***. CN VII: Facial muscles symmetric and strong. No ptosis at rest or after sustained upgaze***. CN VIII: Hears finger rub well bilaterally. CN IX: No hypophonia. CN X: Palate elevates symmetrically. CN XI: Full strength shoulder shrug bilaterally. CN XII: Tongue protrusion full and midline. No atrophy or fasciculations. No significant dysarthria*** Motor: Tone is ***. *** fasciculations in *** extremities. *** atrophy. No grip or percussive myotonia.  Individual muscle group testing (MRC grade out of 5):  Movement     Neck flexion ***    Neck extension ***     Right Left   Shoulder abduction *** ***   Shoulder adduction *** ***   Shoulder ext rotation *** ***   Shoulder int rotation *** ***   Elbow flexion *** ***   Elbow extension *** ***   Wrist extension *** ***   Wrist flexion *** ***   Finger abduction - FDI *** ***   Finger abduction - ADM *** ***   Finger extension *** ***   Finger distal flexion - 2/3 *** ***   Finger distal flexion - 4/5 *** ***   Thumb flexion - FPL *** ***   Thumb abduction - APB *** ***    Hip flexion *** ***   Hip extension *** ***   Hip adduction *** ***   Hip abduction *** ***   Knee extension *** ***   Knee flexion ***  ***   Dorsiflexion *** ***   Plantarflexion *** ***   Inversion *** ***   Eversion *** ***   Great toe extension *** ***   Great toe flexion *** ***     Reflexes:  Right Left  Bicep *** ***  Tricep *** ***  BrRad *** ***  Knee *** ***  Ankle *** ***   Pathological Reflexes: Babinski: *** response bilaterally*** Hoffman: *** Troemner: *** Pectoral: *** Palmomental: *** Facial: *** Midline tap: *** Sensation: Pinprick: *** Vibration: *** Temperature: *** Proprioception: *** Coordination: Intact finger-to- nose-finger and heel-to-shin bilaterally. Romberg negative.*** Gait: Able to rise from chair with arms crossed unassisted. Normal, narrow-based gait. Able to tandem walk. Able to walk on toes and heels.***   Lab and Test Review: No new results***  Previously reviewed results: TSH (11/26/22): 0.946 CBC (11/26/22) unremarkable BMP (11/26/22) unremarkable Lipid panel (09/25/22): Component     Latest Ref Rng 09/25/2022  Cholesterol     <200 mg/dL 130 (H)   HDL Cholesterol     > OR = 50 mg/dL 52   Triglycerides     <150 mg/dL 865   LDL Cholesterol (Calc)     mg/dL (calc) 784 (H)   Total CHOL/HDL Ratio     <5.0 (calc) 4.0   Non-HDL Cholesterol (Calc)     <130 mg/dL (calc) 696 (H)       Imaging: CT head wo contrast (06/07/21): FINDINGS: Brain: No acute intracranial abnormality. Specifically, no hemorrhage, hydrocephalus, mass lesion, acute infarction, or significant intracranial injury.   Vascular: No hyperdense vessel or unexpected calcification.   Skull: No acute calvarial abnormality.   Sinuses/Orbits: No acute findings  Other: None   IMPRESSION: No acute intracranial abnormality.   Echo (12/30/22): Normal   MRI brain wo contrast (11/06/21): FINDINGS:  Mildly motion limited study.   Brain: No acute infarction, hemorrhage, hydrocephalus, extra-axial  collection or mass lesion.   Vascular: Major arterial flow voids are maintained at the skull   base.   Skull and upper cervical spine: Normal marrow signal.   Sinuses/Orbits: Very mild paranasal sinus mucosal thickening.  Unremarkable orbits.   Other: No mastoid effusions.   IMPRESSION:  Unremarkable brain MRI.  No acute abnormality.    MRI cervical spine wo contrast (11/06/21): FINDINGS:  Alignment: Straightening of the normal cervical lordosis without  substantial sagittal subluxation.   Vertebrae: Vertebral body heights are maintained. No specific  evidence of acute fracture or discitis/osteomyelitis. No suspicious  bone lesions.   Cord: Normal cord signal.   Posterior Fossa, vertebral arteries, paraspinal tissues: Visualized  vertebral artery flow voids are maintained. Unremarkable visualized  posterior fossa. No appreciable paraspinal edema.   Disc levels:   C2-C3: Mild posterior disc osteophyte complex and facet arthropathy  without significant canal or foraminal stenosis.   C3-C4: Small posterior disc osteophyte complex and bilateral  facet/uncovertebral hypertrophy. Resulting moderate right greater  than left foraminal stenosis, mildly progressed from prior. No  significant canal stenosis.   C4-C5: Small posterior disc osteophyte complex and right greater  than left facet/uncovertebral hypertrophy. Resulting moderate right  and mild left foraminal stenosis. Mild canal stenosis, similar.   C5-C6: ACDF with patent canal and foramina.  Solid bony fusion.   C6-C7: ACDF with patent canal and foramina.  Solid bony fusion.   C7-T1: Mild posterior disc osteophyte complex and right greater than  left facet/uncovertebral hypertrophy. No impingement.   IMPRESSION:  1. 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 Similar mild canal stenosis at C4-C5.  2. C5-C6 and C6-C7 ACDF with solid bony fusion.   ASSESSMENT: This is Diane Gutierrez, a 58 y.o. female with:  ***  Plan: ***  Return to clinic in  ***  Total time spent reviewing records, interview, history/exam, documentation, and coordination of care on day of encounter:  *** min  Rommie Coats, MD

## 2023-11-23 ENCOUNTER — Ambulatory Visit: Payer: Medicare Other

## 2023-11-23 DIAGNOSIS — N3289 Other specified disorders of bladder: Secondary | ICD-10-CM | POA: Diagnosis not present

## 2023-11-23 DIAGNOSIS — R109 Unspecified abdominal pain: Secondary | ICD-10-CM | POA: Diagnosis not present

## 2023-11-23 DIAGNOSIS — R1084 Generalized abdominal pain: Secondary | ICD-10-CM | POA: Diagnosis not present

## 2023-11-23 MED ORDER — IOHEXOL 300 MG/ML  SOLN
100.0000 mL | Freq: Once | INTRAMUSCULAR | Status: AC | PRN
  Administered 2023-11-23: 100 mL via INTRAVENOUS

## 2023-11-23 MED ORDER — IOHEXOL 9 MG/ML PO SOLN: 500.0000 mL | ORAL | Status: AC

## 2023-11-25 ENCOUNTER — Telehealth: Payer: Self-pay

## 2023-11-25 ENCOUNTER — Ambulatory Visit: Payer: Medicare Other | Admitting: Neurology

## 2023-11-25 NOTE — Telephone Encounter (Signed)
 Copied from CRM [id number]. Topic: Clinical - Lab/Test Results >> Nov 25, 2023  2:01 PM Roseanne Cones wrote: Reason for CRM: Patient had a CT done on Monday - she would like to speak with Dr. Dianah Fort' nurse regarding the results.

## 2023-11-30 ENCOUNTER — Encounter: Payer: Self-pay | Admitting: Family Medicine

## 2023-12-09 ENCOUNTER — Telehealth: Payer: Self-pay | Admitting: Genetic Counselor

## 2023-12-09 NOTE — Telephone Encounter (Signed)
Scheduled appointments per 1/29 scheduling message. Patient is aware of the made appointments and is active on MyChart. Gave the patient the number for billing, she stated if the scheduled appointments didn't work out, she would call back.

## 2023-12-11 DIAGNOSIS — R000 Tachycardia, unspecified: Secondary | ICD-10-CM | POA: Diagnosis not present

## 2023-12-11 DIAGNOSIS — E782 Mixed hyperlipidemia: Secondary | ICD-10-CM | POA: Diagnosis not present

## 2023-12-16 ENCOUNTER — Encounter: Payer: Self-pay | Admitting: Family Medicine

## 2023-12-16 LAB — BASIC METABOLIC PANEL
BUN/Creatinine Ratio: 13 (ref 9–23)
BUN: 10 mg/dL (ref 6–24)
CO2: 26 mmol/L (ref 20–29)
Calcium: 9.5 mg/dL (ref 8.7–10.2)
Chloride: 105 mmol/L (ref 96–106)
Creatinine, Ser: 0.77 mg/dL (ref 0.57–1.00)
Glucose: 81 mg/dL (ref 70–99)
Potassium: 5 mmol/L (ref 3.5–5.2)
Sodium: 142 mmol/L (ref 134–144)
eGFR: 90 mL/min/{1.73_m2} (ref >59)

## 2023-12-16 LAB — CBC
Hematocrit: 41.3 % (ref 34.0–46.6)
Hemoglobin: 13.4 g/dL (ref 11.1–15.9)
MCH: 30.3 pg (ref 26.6–33.0)
MCHC: 32.4 g/dL (ref 31.5–35.7)
MCV: 93 fL (ref 79–97)
Platelets: 237 10*3/uL (ref 150–450)
RBC: 4.42 x10E6/uL (ref 3.77–5.28)
RDW: 12.2 % (ref 11.7–15.4)
WBC: 6.1 10*3/uL (ref 3.4–10.8)

## 2023-12-16 LAB — SPECIMEN STATUS REPORT

## 2023-12-16 LAB — LIPID PANEL
Chol/HDL Ratio: 4.9 {ratio} — ABNORMAL HIGH (ref 0.0–4.4)
Cholesterol, Total: 199 mg/dL (ref 100–199)
HDL: 41 mg/dL (ref >39)
LDL Chol Calc (NIH): 130 mg/dL — ABNORMAL HIGH (ref 0–99)
Triglycerides: 159 mg/dL — ABNORMAL HIGH (ref 0–149)
VLDL Cholesterol Cal: 28 mg/dL (ref 5–40)

## 2023-12-23 ENCOUNTER — Encounter: Payer: Medicare Other | Admitting: Obstetrics and Gynecology

## 2023-12-30 ENCOUNTER — Ambulatory Visit: Payer: Medicare Other | Admitting: Obstetrics and Gynecology

## 2023-12-31 ENCOUNTER — Telehealth: Payer: Self-pay | Admitting: Family Medicine

## 2023-12-31 NOTE — Telephone Encounter (Signed)
Copied from CRM [id number]. Topic: General - Billing Inquiry >> Dec 30, 2023 11:38 AM Desma Mcgregor wrote: Reason for CRM: Pt having some billing issues that they ran into before, where Ms. Roxan Hockey was assisting and she is no longer available. Requesting assistance from Horatio Pel if possible. CB# [phone]

## 2024-01-12 ENCOUNTER — Telehealth: Payer: Self-pay | Admitting: Family Medicine

## 2024-01-12 NOTE — Telephone Encounter (Signed)
 Copied from CRM [id number]. Topic: General - Billing Inquiry >> Jan 11, 2024 11:22 AM Everette C wrote: Reason for CRM: The patient has called to follow up on a previous call form 12/30/23  The patient shares that their has been a potential issue or error with their coding of their recent labs from 12/11/23  Please contact the patient further when possible Patient has concerns about cpt codes are incorrect for lab orders she is getting billed from [facility] please advise

## 2024-01-13 IMAGING — CT CT ABD-PELV W/ CM
2 of 5 series · 16 of 46 positions shown, 18 images · IV contrast (Omnipaque)
Comparison: CT 12/10/2020

CLINICAL DATA: LEFT lower quadrant abdominal pain.

EXAM:
CT ABDOMEN AND PELVIS WITH CONTRAST
TECHNIQUE: Multidetector CT imaging of the abdomen and pelvis was performed
using the standard protocol following bolus administration of
intravenous contrast.

[Series 2: axial st · axial · 0.82mm/px · z∈[-549,-159]mm · 13 of 88 slices shown, 15 images]
[im 5/88  soft-tissue]
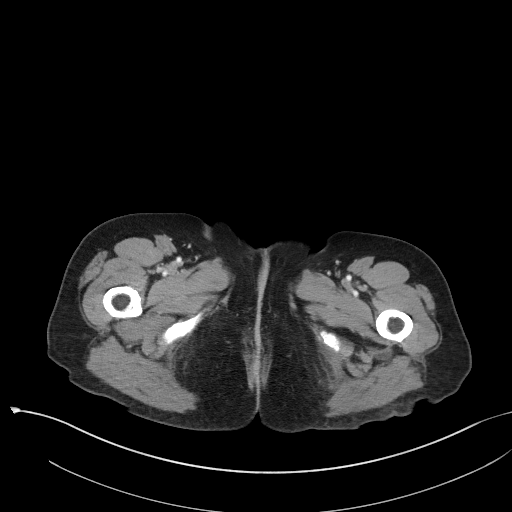
[im 5/88  bone]
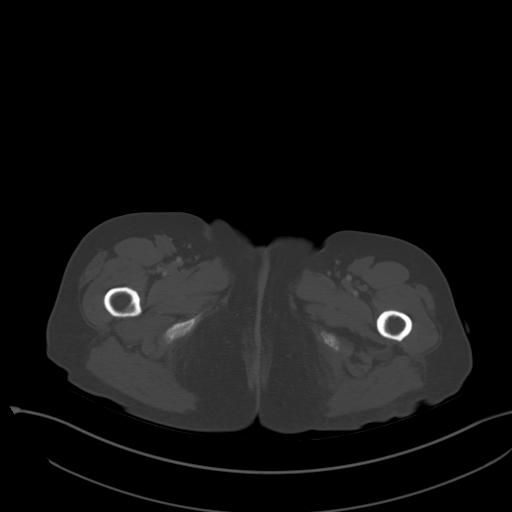
[im 10/88  soft-tissue]
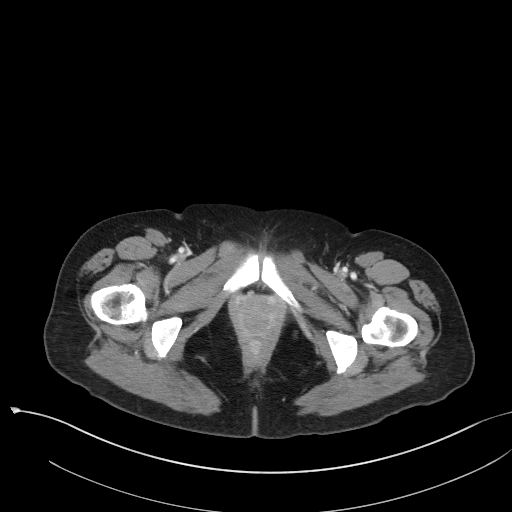
[im 20/88  soft-tissue]
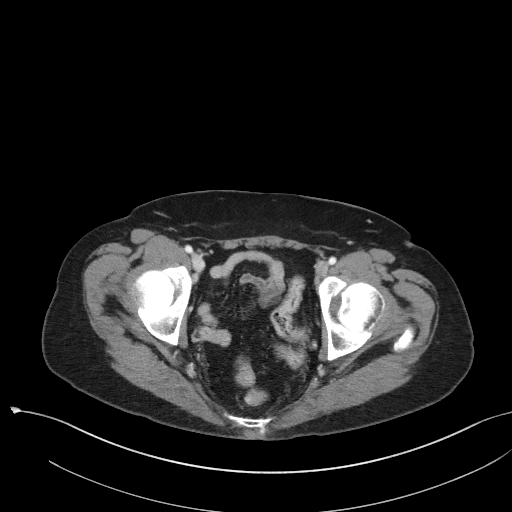
[im 25/88  soft-tissue]
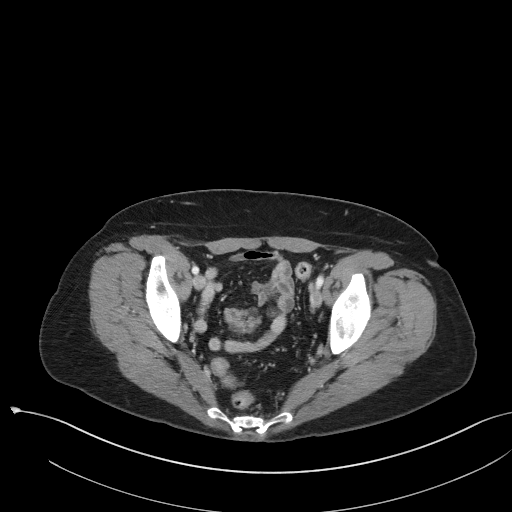
[im 30/88  soft-tissue]
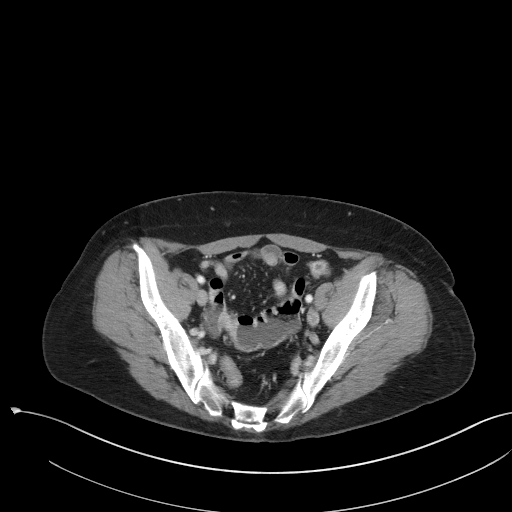
[im 39/88  soft-tissue]
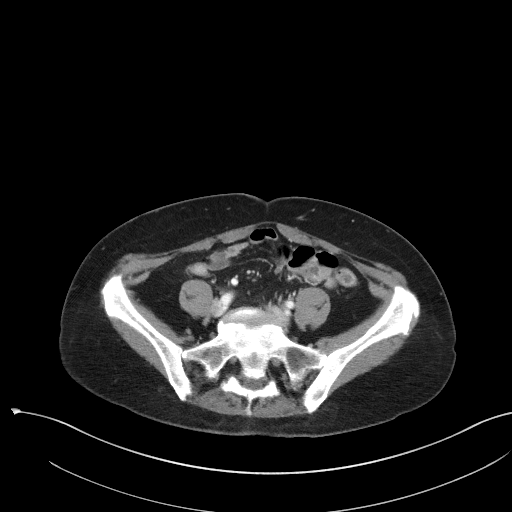
[im 44/88  soft-tissue]
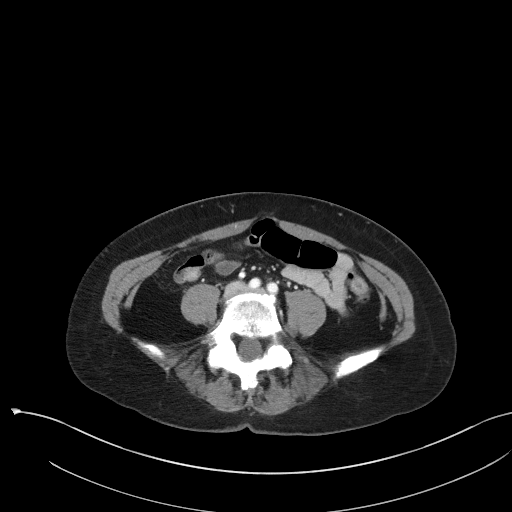
[im 49/88  soft-tissue]
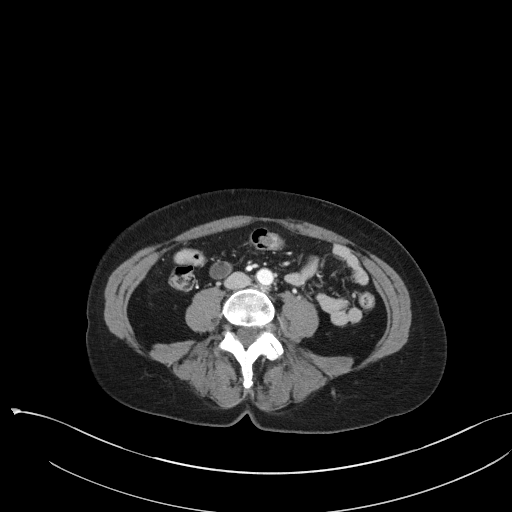
[im 59/88  soft-tissue]
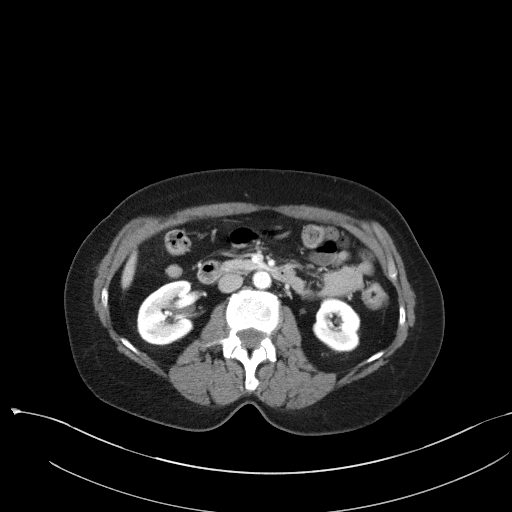
[im 59/88  bone]
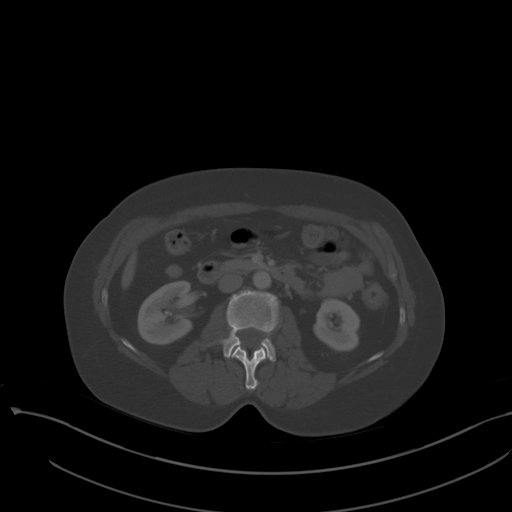
[im 63/88  soft-tissue]
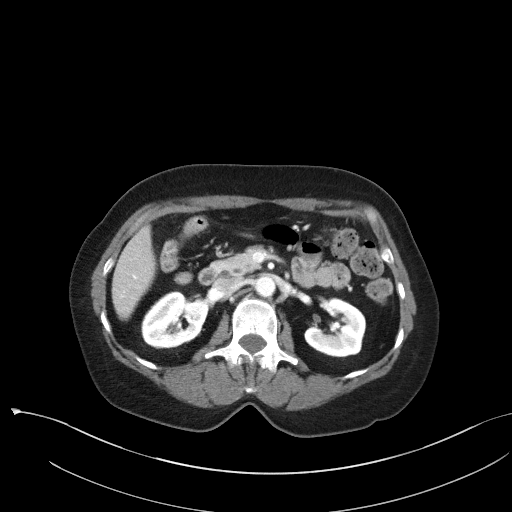
[im 68/88  soft-tissue]
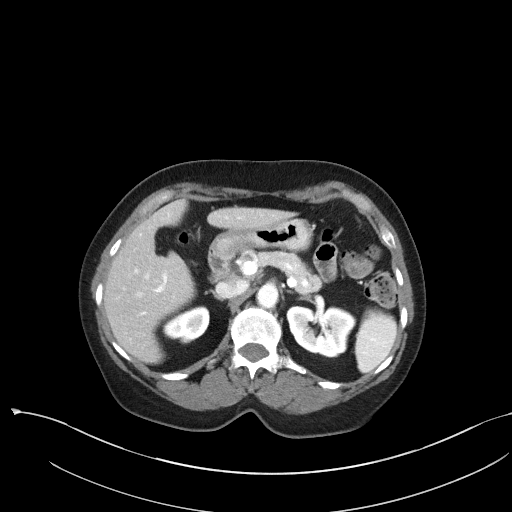
[im 78/88  soft-tissue]
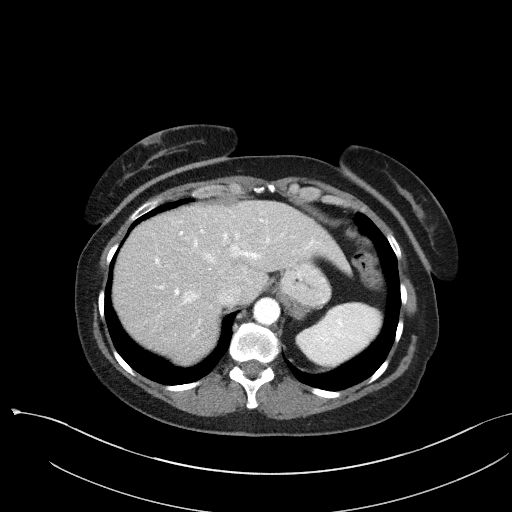
[im 83/88  soft-tissue]
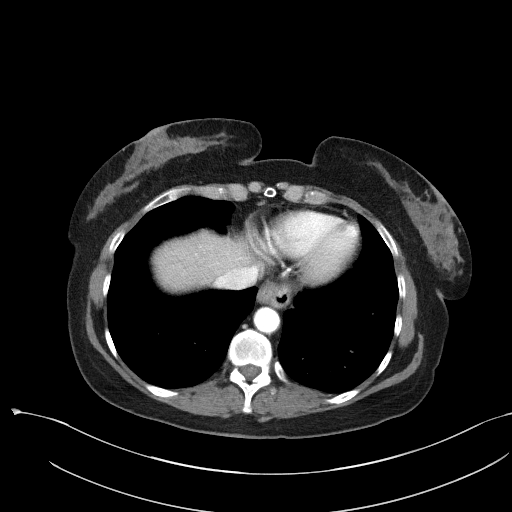

[Series 5: coronal st · coronal · 0.75mm/px · 3 of 99 slices shown]
[im 33/99  soft-tissue]
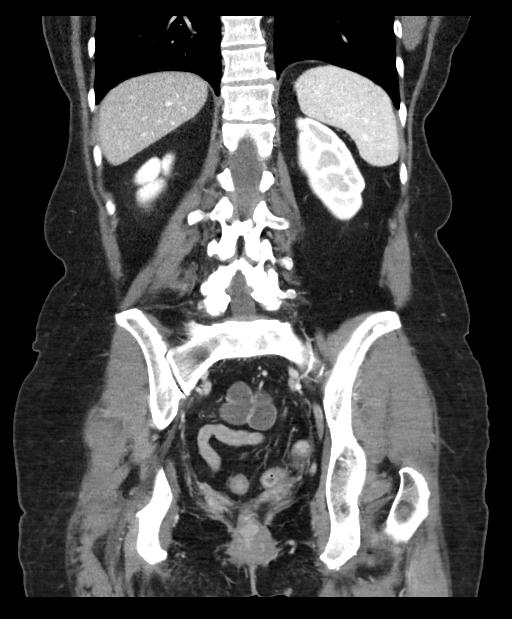
[im 44/99  soft-tissue]
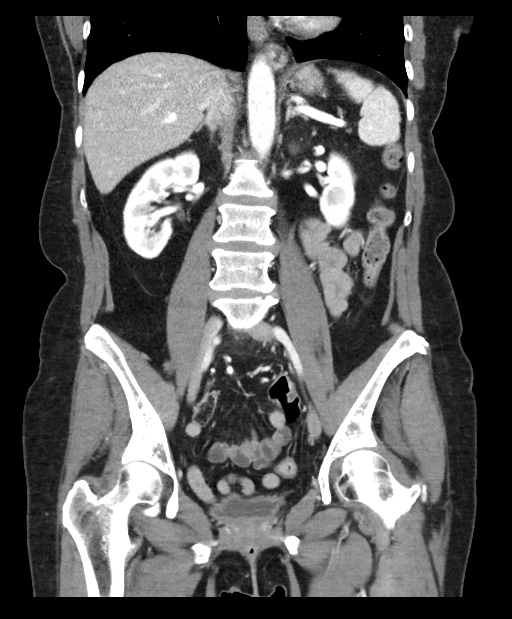
[im 55/99  soft-tissue]
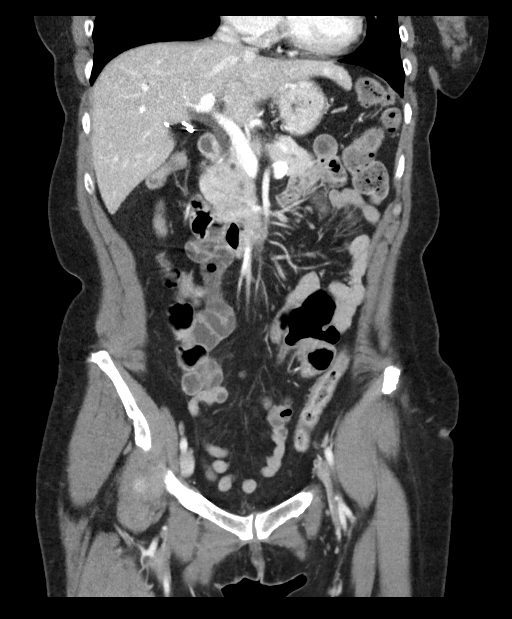

[16 of 46 positions shown; findings below may reference images not displayed]

RADIATION DOSE REDUCTION: This exam was performed according to the
departmental dose-optimization program which includes automated
exposure control, adjustment of the mA and/or kV according to
patient size and/or use of iterative reconstruction technique.

CONTRAST:  100mL OMNIPAQUE IOHEXOL 300 MG/ML  SOLN
FINDINGS: Lower chest: Lung bases are clear.

Hepatobiliary: No focal hepatic lesion. Postcholecystectomy. No
biliary dilatation.

Pancreas: Pancreas is normal. No ductal dilatation. No pancreatic
inflammation.

Spleen: Normal spleen

Adrenals/urinary tract: Adrenal glands and kidneys are normal. The
ureters and bladder normal.

Stomach/Bowel: Small hiatal hernia. Stomach, duodenum small-bowel
normal. The cecum is in the LEFT lower quadrant (terminal ileum seen
on coronal image 53/5). Appendix not identified. No secondary signs
of appendicitis.

The colon and rectosigmoid colon are normal. No small bowel
malrotation.

Vascular/Lymphatic: Abdominal aorta is normal caliber. No periportal
or retroperitoneal adenopathy. No pelvic adenopathy.

Reproductive: Post hysterectomy.  Adnexa unremarkable

Other: No free fluid.

Musculoskeletal: No aggressive osseous lesion.
IMPRESSION: 1. No acute findings in the abdomen pelvis.
2. Unusual location of the cecum in the LEFT lower quadrant which
has migrated from comparison exam. No bowel obstruction or
inflammation identified.
3. Postcholecystectomy hysterectomy.

## 2024-01-21 DIAGNOSIS — K921 Melena: Secondary | ICD-10-CM | POA: Diagnosis not present

## 2024-01-21 DIAGNOSIS — R1319 Other dysphagia: Secondary | ICD-10-CM | POA: Diagnosis not present

## 2024-01-21 DIAGNOSIS — R1013 Epigastric pain: Secondary | ICD-10-CM | POA: Diagnosis not present

## 2024-01-21 DIAGNOSIS — Z800 Family history of malignant neoplasm of digestive organs: Secondary | ICD-10-CM | POA: Diagnosis not present

## 2024-01-21 DIAGNOSIS — R194 Change in bowel habit: Secondary | ICD-10-CM | POA: Diagnosis not present

## 2024-01-25 ENCOUNTER — Encounter: Payer: Medicare Other | Admitting: Genetic Counselor

## 2024-01-25 ENCOUNTER — Other Ambulatory Visit: Payer: Medicare Other

## 2024-02-03 NOTE — Telephone Encounter (Signed)
 Patient informed.

## 2024-02-18 ENCOUNTER — Other Ambulatory Visit (HOSPITAL_BASED_OUTPATIENT_CLINIC_OR_DEPARTMENT_OTHER): Payer: Self-pay

## 2024-02-18 ENCOUNTER — Ambulatory Visit: Payer: Medicare Other | Attending: Cardiovascular Disease | Admitting: Cardiovascular Disease

## 2024-02-18 ENCOUNTER — Encounter: Payer: Self-pay | Admitting: Cardiovascular Disease

## 2024-02-18 VITALS — BP 124/82 | HR 76 | Ht 65.0 in | Wt 124.4 lb

## 2024-02-18 DIAGNOSIS — R002 Palpitations: Secondary | ICD-10-CM

## 2024-02-18 MED ORDER — NITROGLYCERIN 0.4 MG SL SUBL
0.4000 mg | SUBLINGUAL_TABLET | SUBLINGUAL | 3 refills | Status: AC | PRN
  Filled 2024-02-18: qty 25, 1d supply, fill #0
  Filled 2024-08-05: qty 25, 1d supply, fill #1

## 2024-02-18 NOTE — Patient Instructions (Signed)
 Follow-Up: At [facility], you and your health needs are our priority.  As part of our continuing mission to provide you with exceptional heart care, our providers are all part of one team.  This team includes your primary Cardiologist (physician) and Advanced Practice Providers or APPs (Physician Assistants and Nurse Practitioners) who all work together to provide you with the care you need, when you need it.  Your next appointment:   1 year(s)  Provider:   Tonny Bollman MD     1st Floor: - Lobby - Registration  - Pharmacy  - Lab - Cafe  2nd Floor: - PV Lab - Diagnostic Testing (echo, CT, nuclear med)  3rd Floor: - Vacant  4th Floor: - TCTS (cardiothoracic surgery) - AFib Clinic - Structural Heart Clinic - Vascular Surgery  - Vascular Ultrasound  5th Floor: - HeartCare Cardiology (general and EP) - Clinical Pharmacy for coumadin, hypertension, lipid, weight-loss medications, and med management appointments    Valet parking services will be available as well.

## 2024-02-18 NOTE — Progress Notes (Signed)
 Cardiology Office Note:    Date:  02/18/2024   ID:  Diane Gutierrez, DOB [birth year], MRN [id number]  PCP:  Charlton Amor, DO   [facility] Providers Cardiologist:  Lance Muss, MD     Referring MD: Charlton Amor, DO   Chief Complaint  Patient presents with   Palpitations    History of Present Illness:    Diane Gutierrez is a 58 y.o. female with a hx of:  Palpitations  Monitor 04/18/2021: NSR (HR 44-145), rare PACs, PVCs TTE 03/25/2018 ([facility]): EF 55-60, mild MR, mild TR, RVSP 21 TTE 08/28/2021: EF 60-65, no RWMA, normal RVSF, mild MR, RAP 3 TTE 12/30/2022: EF 55-60, no RWMA, normal RVSF, normal PASP, RAP 3 Monitor 01/2023: Rare PACs, rare PVCs, no sustained arrhythmias FHx of CAD CCTA 05/15/2020: CAC score 0, no CAD Exercise Myoview 12/30/2022: Max heart rate 162, no ischemia or infarction, EF 69, low risk GERD  Hyperlipidemia  Anxiety  Ulcerative colitis   The patient is here alone today.  She has been doing relatively well from a cardiac perspective.  She has a long history of heart palpitations and describes herself as a "wide-open person."  She gets palpitations with excitement.  Overall she reports very stable symptoms on metoprolol succinate.  No recent chest pain or chest pressure.  She is physically active and has no exertional symptoms.  Current Medications: Current Meds  Medication Sig   acetaminophen (TYLENOL) 325 MG tablet Take 650 mg by mouth as needed.   Ascorbic Acid (VITAMIN C) 100 MG tablet Take 500 mg by mouth daily.   fluticasone (FLONASE) 50 MCG/ACT nasal spray Place 2 sprays into both nostrils daily.   metoprolol succinate (TOPROL-XL) 25 MG 24 hr tablet Take 1 tablet (25 mg total) by mouth daily with or immediately following a meal.   nitroGLYCERIN (NITROSTAT) 0.4 MG SL tablet Place 1 tablet (0.4 mg total) under the tongue every 5 (five) minutes as needed for chest pain.   omeprazole (PRILOSEC) 20 MG capsule Take 1  capsule (20 mg total) by mouth daily.   promethazine (PHENERGAN) 12.5 MG tablet Take 1 tablet (12.5 mg total) by mouth every 6 (six) hours as needed for nausea or vomiting.   [DISCONTINUED] BIOTIN PO Take 1 tablet by mouth daily.   [DISCONTINUED] estradiol (ESTRACE) 0.1 MG/GM vaginal cream Apply 1 gram per vagina every night for 2 weeks, then apply 1-3  times a week   [DISCONTINUED] Fexofenadine HCl (ALLEGRA PO) Take by mouth. PRN   [DISCONTINUED] nitroGLYCERIN (NITROSTAT) 0.4 MG SL tablet PLACE ONE TABLET UNDER THE TONGUE AT ONSET OF CHEST PAIN. MAY REPEAT EVERY 5 MINUTES AS NEEDED FOR CHEST PAIN UP TO 3 TABLETS IN 15 MINUTES. IF NO RELIEF AFTER 5 MINUTES, CALL 911.     Allergies:   Prednisone and Ibuprofen   ROS:   Please see the history of present illness.    All other systems reviewed and are negative.  EKGs/Labs/Other Studies Reviewed:    The following studies were reviewed today: Cardiac Studies & Procedures   ______________________________________________________________________________________________   STRESS TESTS  MYOCARDIAL PERFUSION IMAGING 12/30/2022  Narrative   The study is normal. The study is low risk.   Patient exercised according to the BRUCE protocol for 5:01 min achieving 7 METs   Target HR was achieved (max HR 162; 98% MPHR)   No ST deviation was noted.   LV perfusion is normal. There is no evidence of ischemia. There is no  evidence of infarction.   Left ventricular function is normal. Nuclear stress EF: 69 %. The left ventricular ejection fraction is hyperdynamic (>65%). End diastolic cavity size is normal. End systolic cavity size is normal.   Prior study not available for comparison.   ECHOCARDIOGRAM  ECHOCARDIOGRAM COMPLETE 12/30/2022  Narrative ECHOCARDIOGRAM REPORT    Patient Name:   Diane Gutierrez [facility] Date of Exam: 12/30/2022 Medical Rec #:  [id number]             Height:       64.0 in Accession #:    [id number]            Weight:       118.8  lb Date of Birth:  Jun 16, 1966             BSA:          1.568 m Patient Age:    56 years              BP:           126/88 mmHg Patient Gender: F                     HR:           74 bpm. Exam Location:  [address]  Procedure: 2D Echo, 3D Echo, Cardiac Doppler and Color Doppler  Indications:    R07.2 Chest Pain  History:        Patient has prior history of Echocardiogram examinations, most recent 08/28/2021. Signs/Symptoms:Shortness of Breath and Dizziness/Lightheadedness; Risk Factors:HLD.  Sonographer:    Clearence Ped RCS Referring Phys: [id number] SCOTT T WEAVER  IMPRESSIONS   1. Left ventricular ejection fraction, by estimation, is 55 to 60%. The left ventricle has normal function. The left ventricle has no regional wall motion abnormalities. Left ventricular diastolic parameters were normal. 2. Right ventricular systolic function is normal. The right ventricular size is normal. There is normal pulmonary artery systolic pressure. 3. The mitral valve is normal in structure. No evidence of mitral valve regurgitation. No evidence of mitral stenosis. 4. The aortic valve is normal in structure. Aortic valve regurgitation is not visualized. No aortic stenosis is present. 5. The inferior vena cava is normal in size with greater than 50% respiratory variability, suggesting right atrial pressure of 3 mmHg.  FINDINGS Left Ventricle: Left ventricular ejection fraction, by estimation, is 55 to 60%. The left ventricle has normal function. The left ventricle has no regional wall motion abnormalities. The left ventricular internal cavity size was normal in size. There is no left ventricular hypertrophy. Left ventricular diastolic parameters were normal.  Right Ventricle: The right ventricular size is normal. No increase in right ventricular wall thickness. Right ventricular systolic function is normal. There is normal pulmonary artery systolic pressure. The tricuspid regurgitant velocity is 2.00  m/s, and with an assumed right atrial pressure of 3 mmHg, the estimated right ventricular systolic pressure is 19.0 mmHg.  Left Atrium: Left atrial size was normal in size.  Right Atrium: Right atrial size was normal in size.  Pericardium: There is no evidence of pericardial effusion.  Mitral Valve: The mitral valve is normal in structure. No evidence of mitral valve regurgitation. No evidence of mitral valve stenosis.  Tricuspid Valve: The tricuspid valve is normal in structure. Tricuspid valve regurgitation is mild . No evidence of tricuspid stenosis.  Aortic Valve: The aortic valve is normal in structure. Aortic valve regurgitation is not visualized. No aortic stenosis is present.  Pulmonic Valve: The pulmonic valve was normal in structure. Pulmonic valve regurgitation is not visualized. No evidence of pulmonic stenosis.  Aorta: The aortic root is normal in size and structure.  Venous: The inferior vena cava is normal in size with greater than 50% respiratory variability, suggesting right atrial pressure of 3 mmHg.  IAS/Shunts: No atrial level shunt detected by color flow Doppler.   LEFT VENTRICLE PLAX 2D LVIDd:         4.60 cm   Diastology LVIDs:         3.20 cm   LV e' medial:    6.96 cm/s LV PW:         0.80 cm   LV E/e' medial:  9.3 LV IVS:        0.60 cm   LV e' lateral:   13.30 cm/s LVOT diam:     1.70 cm   LV E/e' lateral: 4.8 LV SV:         40 LV SV Index:   25 LVOT Area:     2.27 cm  3D Volume EF: 3D EF:        57 % LV EDV:       87 ml LV ESV:       37 ml LV SV:        49 ml  RIGHT VENTRICLE RV Basal diam:  2.40 cm RV S prime:     15.00 cm/s TAPSE (M-mode): 1.8 cm RVSP:           19.0 mmHg  LEFT ATRIUM             Index        RIGHT ATRIUM           Index LA diam:        1.90 cm 1.21 cm/m   RA Pressure: 3.00 mmHg LA Vol (A2C):   21.7 ml 13.84 ml/m  RA Area:     7.74 cm LA Vol (A4C):   13.5 ml 8.61 ml/m   RA Volume:   13.60 ml  8.67 ml/m LA Biplane  Vol: 18.4 ml 11.74 ml/m AORTIC VALVE LVOT Vmax:   89.60 cm/s LVOT Vmean:  62.400 cm/s LVOT VTI:    0.175 m  AORTA Ao Root diam: 3.10 cm Ao Asc diam:  2.90 cm  MITRAL VALVE               TRICUSPID VALVE MV Area (PHT):             TR Peak grad:   16.0 mmHg TR Vmax:        200.00 cm/s MV E velocity: 64.50 cm/s  Estimated RAP:  3.00 mmHg MV A velocity: 56.60 cm/s  RVSP:           19.0 mmHg MV E/A ratio:  1.14 SHUNTS Systemic VTI:  0.18 m Systemic Diam: 1.70 cm  Arvilla Meres MD Electronically signed by Arvilla Meres MD Signature Date/Time: 12/30/2022/10:10:18 AM    Final    MONITORS  CARDIAC EVENT MONITOR 12/09/2022  Narrative   Normal sinus rhythm with rare PACs and rare PVCs.   Single, brief run of PVCs not associated with symptoms.   Symptoms correlated to sinus rhythm.   No sustained arrhythmias.   CT SCANS  CT CORONARY MORPH W/CTA COR W/SCORE 05/15/2020  Addendum 05/15/2020  6:01 PM ADDENDUM REPORT: 05/15/2020 17:58  HISTORY: 58 yo female with nonspecific chest pain  EXAM: Cardiac/Coronary CTA  TECHNIQUE: The patient was scanned  on a [employer].  PROTOCOL: A 120 kV prospective scan was triggered in the descending thoracic aorta at 111 HU's. Axial non-contrast 3 mm slices were carried out through the heart. The data set was analyzed on a dedicated work station and scored using the Agatson method. Gantry rotation speed was 250 msecs and collimation was .6 mm. Beta blockade and 0.8 mg of sl NTG was given. The 3D data set was reconstructed in 5% intervals of the 67-82 % of the R-R cycle. Diastolic phases were analyzed on a dedicated work station using MPR, MIP and VRT modes. The patient received 80mL OMNIPAQUE IOHEXOL 350 MG/ML SOLN of contrast.  FINDINGS: Quality: Good, HR 65  Coronary calcium score: The patient's coronary artery calcium score is 0, which places the patient in the 0 percentile.  Coronary arteries: Normal coronary  origins.  Right dominance.  Right Coronary Artery: Dominant. Normal artery. Normal R-PLB and R-PDA branches.  Left Main Coronary Artery: Normal. Bifurcates into the LAD and LCx arteries.  Left Anterior Descending Coronary Artery: Large anterior vessel which wraps around the apex. Single high D1 branch. No disease.  Left Circumflex Artery: Large lateral artery without disease.  Aorta: Normal size, 31 mm at the mid ascending aorta (level of the PA bifurcation) measured double oblique. No calcifications. No dissection.  Aortic Valve: Trileaflet.  No calcifications.  Other findings:  Normal pulmonary vein drainage into the left atrium.  Normal left atrial appendage without a thrombus.  Normal size of the pulmonary artery.  IMPRESSION: 1. No evidence of CAD, CADRADS = 0.  2. Coronary calcium score of 0. This was 0 percentile for age and sex matched control.  3. Normal coronary origin with right dominance.   Electronically Signed By: Chrystie Nose M.D. On: 05/15/2020 17:58  Narrative EXAM: OVER-READ INTERPRETATION  CT CHEST  The following report is an over-read performed by radiologist Dr. Trudie Reed of [facility] on 05/15/2020. This over-read does not include interpretation of cardiac or coronary anatomy or pathology. The coronary calcium score/coronary CTA interpretation by the cardiologist is attached.  COMPARISON:  None.  FINDINGS: Within the visualized portions of the thorax there are no suspicious appearing pulmonary nodules or masses, there is no acute consolidative airspace disease, no pleural effusions, no pneumothorax and no lymphadenopathy. Visualized portions of the upper abdomen demonstrates mild diffuse low attenuation throughout the visualized hepatic parenchyma, indicative of hepatic steatosis. Small hiatal hernia. There are no aggressive appearing lytic or blastic lesions noted in the visualized portions of the  skeleton.  IMPRESSION: 1. Hepatic steatosis. 2. Small hiatal hernia.  Electronically Signed: By: Trudie Reed M.D. On: 05/15/2020 10:10     ______________________________________________________________________________________________      EKG:   EKG Interpretation Date/Time:  Thursday February 18 2024 15:21:28 EDT Ventricular Rate:  76 PR Interval:  136 QRS Duration:  82 QT Interval:  388 QTC Calculation: 436 R Axis:   70  Text Interpretation: Normal sinus rhythm with sinus arrhythmia Normal ECG When compared with ECG of 26-Nov-2022 15:53, PREVIOUS ECG IS PRESENT No significant change was found Confirmed by Tonny Bollman [phone]) on 02/18/2024 3:27:30 PM    Recent Labs: 05/13/2023: ALT 15 12/11/2023: BUN 10; Creatinine, Ser 0.77; Hemoglobin 13.4; Platelets 237; Potassium 5.0; Sodium 142  Recent Lipid Panel    Component Value Date/Time   CHOL 199 12/11/2023 0000   TRIG 159 (H) 12/11/2023 0000   HDL 41 12/11/2023 0000   CHOLHDL 4.9 (H) 12/11/2023 0000   CHOLHDL  4.0 09/25/2022 0000   VLDL 26.8 08/20/2021 0840   LDLCALC 130 (H) 12/11/2023 0000   LDLCALC 131 (H) 09/25/2022 0000     Risk Assessment/Calculations:                Physical Exam:    VS:  BP 124/82   Pulse 76   Ht 5\' 5"  (1.651 m)   Wt 124 lb 6.4 oz (56.4 kg)   SpO2 98%   BMI 20.70 kg/m     Wt Readings from Last 3 Encounters:  02/18/24 124 lb 6.4 oz (56.4 kg)  10/01/23 122 lb (55.3 kg)  08/12/23 118 lb (53.5 kg)     GEN:  Well nourished, well developed in no acute distress HEENT: Normal NECK: No JVD; No carotid bruits LYMPHATICS: No lymphadenopathy CARDIAC: RRR, no murmurs, rubs, gallops RESPIRATORY:  Clear to auscultation without rales, wheezing or rhonchi  ABDOMEN: Soft, non-tender, non-distended MUSCULOSKELETAL:  No edema; No deformity  SKIN: Warm and dry NEUROLOGIC:  Alert and oriented x 3 PSYCHIATRIC:  Normal affect   Assessment & Plan Palpitations The patient appears clinically  stable with control of her heart palpitations on metoprolol succinate.  She has some questions about POTS syndrome, but in my review of her symptoms and monitor results, I do not think she meets criteria for this.  I think she is clinically stable and have recommended continuation of her current medical therapy.  She complains of generalized fatigue but does not have any recent episodes of syncope or near syncope.  Her heart palpitations are controlled.  No significant chest pain at present.  Continue annual follow-up unless there is some change in symptoms.  As part of her evaluation today, I reviewed her most recent echocardiogram which shows no evidence of structural heart disease.  I reviewed her most recent Myoview stress scan which showed no ischemia, and I reviewed her gated coronary CTA which showed no evidence of CAD.            Medication Adjustments/Labs and Tests Ordered: Current medicines are reviewed at length with the patient today.  Concerns regarding medicines are outlined above.  Orders Placed This Encounter  Procedures   EKG 12-Lead   EKG 12-Lead   Meds ordered this encounter  Medications   nitroGLYCERIN (NITROSTAT) 0.4 MG SL tablet    Sig: Place 1 tablet (0.4 mg total) under the tongue every 5 (five) minutes as needed for chest pain.    Dispense:  25 tablet    Refill:  3    Patient Instructions  Follow-Up: At [facility], you and your health needs are our priority.  As part of our continuing mission to provide you with exceptional heart care, our providers are all part of one team.  This team includes your primary Cardiologist (physician) and Advanced Practice Providers or APPs (Physician Assistants and Nurse Practitioners) who all work together to provide you with the care you need, when you need it.  Your next appointment:   1 year(s)  Provider:   Tonny Bollman MD     1st Floor: - Lobby - Registration  - Pharmacy  - Lab - Cafe  2nd Floor: -  PV Lab - Diagnostic Testing (echo, CT, nuclear med)  3rd Floor: - Vacant  4th Floor: - TCTS (cardiothoracic surgery) - AFib Clinic - Structural Heart Clinic - Vascular Surgery  - Vascular Ultrasound  5th Floor: - HeartCare Cardiology (general and EP) - Clinical Pharmacy for coumadin, hypertension, lipid, weight-loss medications, and  med management appointments    Valet parking services will be available as well.     Signed, Tonny Bollman, MD  02/18/2024 4:41 PM    [facility]

## 2024-02-18 NOTE — Assessment & Plan Note (Signed)
 The patient appears clinically stable with control of her heart palpitations on metoprolol succinate.  She has some questions about POTS syndrome, but in my review of her symptoms and monitor results, I do not think she meets criteria for this.  I think she is clinically stable and have recommended continuation of her current medical therapy.  She complains of generalized fatigue but does not have any recent episodes of syncope or near syncope.  Her heart palpitations are controlled.  No significant chest pain at present.  Continue annual follow-up unless there is some change in symptoms.  As part of her evaluation today, I reviewed her most recent echocardiogram which shows no evidence of structural heart disease.  I reviewed her most recent Myoview stress scan which showed no ischemia, and I reviewed her gated coronary CTA which showed no evidence of CAD.

## 2024-02-23 ENCOUNTER — Other Ambulatory Visit: Payer: Self-pay | Admitting: Family Medicine

## 2024-02-23 ENCOUNTER — Other Ambulatory Visit (HOSPITAL_BASED_OUTPATIENT_CLINIC_OR_DEPARTMENT_OTHER): Payer: Self-pay

## 2024-02-23 ENCOUNTER — Other Ambulatory Visit: Payer: Self-pay

## 2024-02-23 DIAGNOSIS — R110 Nausea: Secondary | ICD-10-CM

## 2024-02-23 MED ORDER — PROMETHAZINE HCL 12.5 MG PO TABS
12.5000 mg | ORAL_TABLET | Freq: Four times a day (QID) | ORAL | 0 refills | Status: AC | PRN
  Filled 2024-02-23: qty 30, 8d supply, fill #0

## 2024-02-25 ENCOUNTER — Other Ambulatory Visit: Payer: Medicare Other

## 2024-02-25 ENCOUNTER — Encounter: Payer: Medicare Other | Admitting: Genetic Counselor

## 2024-03-16 ENCOUNTER — Encounter: Payer: Medicare Other | Admitting: Obstetrics and Gynecology

## 2024-03-17 ENCOUNTER — Encounter: Payer: Self-pay | Admitting: Family Medicine

## 2024-03-18 ENCOUNTER — Other Ambulatory Visit (HOSPITAL_BASED_OUTPATIENT_CLINIC_OR_DEPARTMENT_OTHER): Payer: Self-pay

## 2024-03-18 MED ORDER — SUTAB 1479-225-188 MG PO TABS
ORAL_TABLET | ORAL | 0 refills | Status: DC
Start: 2024-03-18 — End: 2024-05-03
  Filled 2024-03-18: qty 24, 2d supply, fill #0

## 2024-03-22 ENCOUNTER — Other Ambulatory Visit (HOSPITAL_BASED_OUTPATIENT_CLINIC_OR_DEPARTMENT_OTHER): Payer: Self-pay

## 2024-03-25 ENCOUNTER — Ambulatory Visit: Payer: Medicare Other | Admitting: Obstetrics and Gynecology

## 2024-03-30 NOTE — Progress Notes (Unsigned)
 I saw Diane Gutierrez in neurology clinic on 04/07/24 in follow up for numbness and tingling in her legs and hands and weakness.  HPI: Diane Gutierrez is a 58 y.o. 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  To briefly review: Patient first noticed symptoms for about 3 years. She first noticed numbness and tingling in her feet. She experiences shock sensations, now including her hands, especially over the last year. The symptoms have progressed since onset. She has difficulty clearing her right foot when walking. She has almost fallen several times, but not had a fall. Patient endorses muscle loss in lower legs. She endorses high arches.   Patient has chronic dizziness that she has attributed to her neck. She can feel like she is rocking when sitting. She has neck pain and turning her head can cause dizziness or her to pass out. She can have blurry vision.   She endorses cramps and twitching.   Patient is concerned about CMT due to grandmother and great grandfather having CMT. Grandmother had symptoms in her 30s and had drop foot. She also has friends with MS and is concerned about this.   She was seen in 2022 and 2023 by [facility] for headaches and dizziness and mentioned CMT, but  didn't seem like much was done. She never had EMG. MRI of her brain that was unremarkable.   She does not report any constitutional symptoms like fever or unintentional weight loss. She has night sweats requiring her to change her sheets or clothes. She wonders if this is hormone related.   She also mentions feeling her skull has changed with more indents then prior.   She also notes that she has had some left sided weakness that is chronic since her neck surgery.   EtOH use: None  Restrictive diet? Does not eat a lot of meat  Most recent Assessment and Plan (05/20/23): Diane Gutierrez is a 58 y.o.  female who presents for evaluation of numbness and tingling in legs and hands and weakness. 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 Her neurological examination is pertinent for hyperflexia, mild left side weakness, and give way weakness. Available diagnostic data is significant for normal MRI brain in 2022 and MRI cervical spine with post op changes and mild canal stenosis with moderate foraminal stenosis at right C4-5 and bilateral C3-4. The etiology of patient's symptoms is unclear. She is concerned for CMT or MS. Her MRI brain and cervical spine just a couple of years ago, after symptom onset does not support this diagnosis, though these were done without contrast. I would expect an earlier age of onset of MS as well. Regarding CMT or any neuropathy, patient has more upper motor neuron signs with hyperreflexia than lower motor neuron as would be expected with CMT. This may be due to cervical spine disease, but a neuropathy could also be masked by this. I will get lab work and EMG to further clarify and look for treatable causes. Repeat imaging may be needed if another cause is not found.   PLAN: -Blood work: CK, HbA1c, B12, IFE, B1, ESR, CRP, vit E, copper -EMG: PN protocol (L > R) -May consider repeating MRI of brain and cervical spine w/wo contrast pending results of EMG  Since their last visit: Patient continues to have numbness and tingling in  her legs and hands. This is the same as prior. She drops things occasionally. I saw her last year for this but she did not get her labs drawn. EMG was not scheduled. She states this was due to other family issues.  She now endorses dizziness. She feels like she is vibrating. She can have occasional pain on the left top of her head that is dull. She also mentions blurry vision. She is going to see an eye doctor but could not get an appointment until 10/2024.  She has fluctuations  in her weight due to nausea. GI is currently working this up.   MEDICATIONS:  Outpatient Encounter Medications as of 04/07/2024  Medication Sig   acetaminophen  (TYLENOL ) 325 MG tablet Take 650 mg by mouth as needed.   Ascorbic Acid (VITAMIN C) 100 MG tablet Take 500 mg by mouth daily.   fluticasone  (FLONASE ) 50 MCG/ACT nasal spray Place 2 sprays into both nostrils daily. (Patient taking differently: Place 2 sprays into both nostrils as needed.)   metoprolol  succinate (TOPROL -XL) 25 MG 24 hr tablet Take 1 tablet (25 mg total) by mouth daily with or immediately following a meal.   nitroGLYCERIN  (NITROSTAT ) 0.4 MG SL tablet Place 1 tablet (0.4 mg total) under the tongue every 5 (five) minutes as needed for chest pain.   promethazine  (PHENERGAN ) 12.5 MG tablet Take 1 tablet (12.5 mg total) by mouth every 6 (six) hours as needed for nausea or vomiting.   omeprazole  (PRILOSEC) 20 MG capsule Take 1 capsule (20 mg total) by mouth daily. (Patient not taking: Reported on 04/07/2024)   Sodium Sulfate-Mag Sulfate-KCl (SUTAB ) [phone] MG TABS Take twenty four tablets by mouth see administration instructions. Take according to the colonoscopy prep instructions (Patient not taking: Reported on 04/07/2024)   No facility-administered encounter medications on file as of 04/07/2024.    PAST MEDICAL HISTORY: Past Medical History:  Diagnosis Date   Abdominal pain    Acid reflux    Anxiety    Colicky RLQ abdominal pain 07/07/2019   Fibrocystic breast 07/10/2019   Hiatal hernia    Hyperlipidemia    Leukopenia    Low back pain    Lung nodule    Neck pain    Palpitations    Precordial pain 08/08/2021   Myoview  12/30/22: EF 69, no ischemia or infarction; low risk // TTE 12/30/22: EF 55-60, no RWMA, NL PASP, RAP 3   PVC (premature ventricular contraction) 06/22/2019   Monitor 01/2023: NSR, rare PACs and rare PVCs; single brief run of PVCs; no sustained arrhythmias   Seasonal allergies    Seizure (HCC)     childhood   Sneezing    SOB (shortness of breath)    Echo 10/22: EF 60-65, no RWMA, normal RVSF, mild MR   Tachycardia    Tachycardia 07/07/2019   Umbilical hernia    Weight gain     PAST SURGICAL HISTORY: Past Surgical History:  Procedure Laterality Date   BREAST BIOPSY Left    BREAST SURGERY     lumpectomy, cyst aspirated.on left   CHOLECYSTECTOMY     COLONOSCOPY W/ POLYPECTOMY     HYSTEROSCOPY DIAGNOSTIC     JOINT REPLACEMENT Left    knee x 2 with screws in place   KNEE SURGERY     NECK SURGERY     Dr Faylene Hoots   ROBOTIC ASSISTED TOTAL HYSTERECTOMY  2021   For CAH, with BSO, [facility]    ALLERGIES: Allergies  Allergen Reactions   Prednisone  Rash and Hives    RASH, severe  Whelps and skin feels like it is on fire   Ibuprofen     GI UPSET  Other Reaction(s): GI Intolerance    FAMILY HISTORY: Family History  Problem Relation Age of Onset   Arthritis Mother    Hearing loss Mother    Heart disease Mother        MI   Hyperlipidemia Mother    Thyroid  cancer Mother    Hypertension Father    Heart disease Father        s/p CABG   Diabetes Father    Hyperlipidemia Father    Other Father    Colon cancer Father    Drug abuse Sister        crack cocaine   Other Sister    Diabetes Sister        smoker   Bipolar disorder Sister    Breast cancer Sister        Genetic testing, ? results   Heart disease Brother    Sleep apnea Brother    Arthritis Maternal Grandmother    Heart disease Maternal Grandmother    Arthritis Maternal Grandfather    Heart disease Maternal Grandfather    Heart disease Paternal Grandmother    Cervical cancer Paternal Grandmother    Cancer Paternal Grandfather    Heart disease Paternal Grandfather    Heart disease Paternal Aunt    Esophageal cancer Neg Hx    Rectal cancer Neg Hx    Stomach cancer Neg Hx     SOCIAL HISTORY: Social History   Tobacco Use   Smoking status: Never   Smokeless tobacco: Never  Vaping Use   Vaping  status: Never Used  Substance Use Topics   Alcohol use: Not Currently   Drug use: Never   Social History   Social History Narrative   Lives with husband. She has two children and one step child. She enjoys reading, spending time with her dog and crafts.   Caffeine 1 cup to 2 cups daily   Are you right handed or left handed? Right   Are you currently employed ?    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   Who lives with you? With husband   What type of home do you live in: 1 story or 2 story? one        Objective:  Vital Signs:  BP 101/62   Pulse 79   Ht 5\' 5"  (1.651 m)   Wt 123 lb (55.8 kg)   SpO2 99%   BMI 20.47 kg/m   General: No acute distress.  Patient appears well-groomed.   Head:  Normocephalic/atraumatic Neck: supple Lungs: Non-labored breathing on room air   Neurological Exam: Mental status: alert and oriented, speech fluent and not dysarthric, language intact.  Cranial nerves: CN I: not tested CN II: pupils equal, round and reactive to light, visual fields intact CN III, IV, VI:  full range of motion, no nystagmus, no ptosis CN V: facial sensation intact. CN VII: upper and lower face symmetric CN VIII: hearing intact CN IX, X: uvula midline CN XI: sternocleidomastoid and trapezius muscles intact CN XII: tongue midline  Bulk & Tone: normal, no fasciculations. Motor:  muscle strength 5/5 throughout (give way weakness intermittently) Deep Tendon Reflexes:  2+ throughout. Hoffman and Tromner positive bilaterally   Sensation:  Pinprick and vibratory sensation intact. Finger to nose testing:  Without dysmetria.  Gait:  Normal station and stride.  Romberg negative.   Lab and Test Review: New results: 12/11/23: CBC unremarkable BMP unremarkable Lipid panel: tChol 199, LDL 130, TG 159  Previously reviewed results: TSH (11/26/22): 0.946   Imaging: CT head wo contrast (06/07/21): FINDINGS: Brain: No acute intracranial  abnormality. Specifically, no hemorrhage, hydrocephalus, mass lesion, acute infarction, or significant intracranial injury.   Vascular: No hyperdense vessel or unexpected calcification.   Skull: No acute calvarial abnormality.   Sinuses/Orbits: No acute findings   Other: None   IMPRESSION: No acute intracranial abnormality.   Echo (12/30/22): Normal   MRI brain wo contrast (11/06/21): FINDINGS:  Mildly motion limited study.   Brain: No acute infarction, hemorrhage, hydrocephalus, extra-axial  collection or mass lesion.   Vascular: Major arterial flow voids are maintained at the skull  base.   Skull and upper cervical spine: Normal marrow signal.   Sinuses/Orbits: Very mild paranasal sinus mucosal thickening.  Unremarkable orbits.   Other: No mastoid effusions.   IMPRESSION:  Unremarkable brain MRI.  No acute abnormality.    MRI cervical spine wo contrast (11/06/21): FINDINGS:  Alignment: Straightening of the normal cervical lordosis without  substantial sagittal subluxation.   Vertebrae: Vertebral body heights are maintained. No specific  evidence of acute fracture or discitis/osteomyelitis. No suspicious  bone lesions.   Cord: Normal cord signal.   Posterior Fossa, vertebral arteries, paraspinal tissues: Visualized  vertebral artery flow voids are maintained. Unremarkable visualized  posterior fossa. No appreciable paraspinal edema.   Disc levels:   C2-C3: Mild posterior disc osteophyte complex and facet arthropathy  without significant canal or foraminal stenosis.   C3-C4: Small posterior disc osteophyte complex and bilateral  facet/uncovertebral hypertrophy. Resulting moderate right greater  than left foraminal stenosis, mildly progressed from prior. No  significant canal stenosis.   C4-C5: Small posterior disc osteophyte complex and right greater  than left facet/uncovertebral hypertrophy. Resulting moderate right  and mild left foraminal stenosis.  Mild canal stenosis, similar.   C5-C6: ACDF with patent canal and foramina.  Solid bony fusion.   C6-C7: ACDF with patent canal and foramina.  Solid bony fusion.   C7-T1: Mild posterior disc osteophyte complex and right greater than  left facet/uncovertebral hypertrophy. No impingement.   IMPRESSION:  1. 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 Similar mild canal stenosis at C4-C5.  2. C5-C6 and C6-C7 ACDF with solid bony fusion.   ASSESSMENT: This is Cornell Gaber, a 58 y.o. female with: Numbness and tingling in bilateral legs and hands - patient was previously concerned about MS or CMT. MRI does not support MS. Exam does not show a clear neuropathy, but I attempted to work up symptoms last year but she did not get labs or schedule EMG. Her examination is still essentially normal with give way weakness suggesting potential functional etiology. There may also be contributions from prior cervical spine disease. Headache, blurry vision, and dizziness - previously seen by neurology for these issues in 2022 and 2023. MRI brain and cervical spine normal. I will check labs to look for treatable causes.  Plan: -Blood work: B1, B12, IFE, ESR, CRP, vit E, copper -Discussed EMG - patient agreed to defer for now -Fall precautions discussed  Return to clinic in 6 months  Total time spent reviewing records, interview, history/exam, documentation, and coordination of care on day of encounter:  30 min  Rommie Coats, MD

## 2024-03-31 ENCOUNTER — Other Ambulatory Visit (HOSPITAL_BASED_OUTPATIENT_CLINIC_OR_DEPARTMENT_OTHER): Payer: Self-pay

## 2024-04-07 ENCOUNTER — Telehealth: Payer: Self-pay

## 2024-04-07 ENCOUNTER — Ambulatory Visit: Payer: Medicare Other | Admitting: Neurology

## 2024-04-07 ENCOUNTER — Encounter: Payer: Self-pay | Admitting: Neurology

## 2024-04-07 ENCOUNTER — Other Ambulatory Visit

## 2024-04-07 VITALS — BP 101/62 | HR 79 | Ht 65.0 in | Wt 123.0 lb

## 2024-04-07 DIAGNOSIS — R519 Headache, unspecified: Secondary | ICD-10-CM

## 2024-04-07 DIAGNOSIS — R5383 Other fatigue: Secondary | ICD-10-CM | POA: Diagnosis not present

## 2024-04-07 DIAGNOSIS — R202 Paresthesia of skin: Secondary | ICD-10-CM

## 2024-04-07 DIAGNOSIS — H538 Other visual disturbances: Secondary | ICD-10-CM

## 2024-04-07 DIAGNOSIS — R42 Dizziness and giddiness: Secondary | ICD-10-CM | POA: Diagnosis not present

## 2024-04-07 DIAGNOSIS — R200 Anesthesia of skin: Secondary | ICD-10-CM | POA: Diagnosis not present

## 2024-04-07 DIAGNOSIS — M47812 Spondylosis without myelopathy or radiculopathy, cervical region: Secondary | ICD-10-CM | POA: Diagnosis not present

## 2024-04-07 NOTE — Telephone Encounter (Signed)
 Copied from CRM [id number]. Topic: Clinical - Medical Advice >> Apr 07, 2024  9:20 AM Danelle Dunning F wrote: Reason for CRM:   Patient is due to have a colonoscopy with [facility] on April 19, 2024 and would like to have the colonoscopy results assigned to a primary care provider. The patient was made aware that she will have a new PCP in mid June but she would like to confirm who she should have her colonoscopy assigned to so the office is able to communicate any future information and findings after the procedure.   Callback Number: [id number]

## 2024-04-07 NOTE — Patient Instructions (Signed)
 I will do blood work today to look into your symptoms. I will be in touch when I have your results.  When you get through your GI testing, we can re-examine and determine what, if any, testing still needs to be done.  I do not see any clear evidence of a neurologic issue at this point, but will monitor your symptoms.  Please let me know if you have any questions or concerns in the meantime.  The physicians and staff at [facility] are committed to providing excellent care. You may receive a survey requesting feedback about your experience at our office. We strive to receive "very good" responses to the survey questions. If you feel that your experience would prevent you from giving the office a "very good " response, please contact our office to try to remedy the situation. We may be reached at [phone]. Thank you for taking the time out of your busy day to complete the survey.  Rommie Coats, MD [facility]

## 2024-04-07 NOTE — Telephone Encounter (Signed)
 Patient wanting to know if she can be transferred to Sandy Crumb PA for her PCP since DR. Dianah Fort is leaving . States she has seen her in the past and really liked her .

## 2024-04-07 NOTE — Telephone Encounter (Signed)
 Spoke with patient - recommended that she ask that the results be faxed to our office and also asked that she contact us  once she is informed that the results have processed just to be sure that we can check to verify that we did receive the colonoscopy results.

## 2024-04-08 NOTE — Telephone Encounter (Signed)
 Ok for transfer?

## 2024-04-13 LAB — IMMUNOFIXATION ELECTROPHORESIS
IgM, Serum: 122 mg/dL (ref 50–300)
IgM, Serum: 961 mg/dL (ref 600–300)
Immunoglobulin A: 224 mg/dL (ref 47–310)
Immunoglobulin A: 961 mg/dL (ref 600–310)

## 2024-04-13 LAB — SEDIMENTATION RATE: Sed Rate: 6 mm/h (ref 0–30)

## 2024-04-13 LAB — COPPER, SERUM: Copper: 93 ug/dL (ref 70–175)

## 2024-04-13 LAB — VITAMIN D 25 HYDROXY (VIT D DEFICIENCY, FRACTURES): Vit D, 25-Hydroxy: 41 ng/mL (ref 30–100)

## 2024-04-13 LAB — C-REACTIVE PROTEIN: CRP: <3 mg/L (ref <8.0)

## 2024-04-13 LAB — VITAMIN B1: Vitamin B1 (Thiamine): 7 nmol/L — ABNORMAL LOW (ref 8–30)

## 2024-04-13 LAB — VITAMIN B12: Vitamin B-12: 1282 pg/mL — ABNORMAL HIGH (ref 200–1100)

## 2024-04-14 ENCOUNTER — Ambulatory Visit: Payer: Self-pay | Admitting: Neurology

## 2024-04-19 DIAGNOSIS — D123 Benign neoplasm of transverse colon: Secondary | ICD-10-CM | POA: Diagnosis not present

## 2024-04-19 DIAGNOSIS — R194 Change in bowel habit: Secondary | ICD-10-CM | POA: Diagnosis not present

## 2024-04-19 DIAGNOSIS — K3189 Other diseases of stomach and duodenum: Secondary | ICD-10-CM | POA: Diagnosis not present

## 2024-04-19 DIAGNOSIS — K222 Esophageal obstruction: Secondary | ICD-10-CM | POA: Diagnosis not present

## 2024-04-19 DIAGNOSIS — K649 Unspecified hemorrhoids: Secondary | ICD-10-CM | POA: Diagnosis not present

## 2024-04-19 DIAGNOSIS — K449 Diaphragmatic hernia without obstruction or gangrene: Secondary | ICD-10-CM | POA: Diagnosis not present

## 2024-04-19 DIAGNOSIS — K921 Melena: Secondary | ICD-10-CM | POA: Diagnosis not present

## 2024-04-19 DIAGNOSIS — R1310 Dysphagia, unspecified: Secondary | ICD-10-CM | POA: Diagnosis not present

## 2024-04-19 DIAGNOSIS — R1013 Epigastric pain: Secondary | ICD-10-CM | POA: Diagnosis not present

## 2024-04-19 DIAGNOSIS — R1012 Left upper quadrant pain: Secondary | ICD-10-CM | POA: Diagnosis not present

## 2024-04-19 DIAGNOSIS — D122 Benign neoplasm of ascending colon: Secondary | ICD-10-CM | POA: Diagnosis not present

## 2024-04-19 LAB — HM COLONOSCOPY

## 2024-04-20 NOTE — Telephone Encounter (Signed)
Pt called in returning Renee's call. 

## 2024-05-03 ENCOUNTER — Ambulatory Visit (INDEPENDENT_AMBULATORY_CARE_PROVIDER_SITE_OTHER): Admitting: Physician Assistant

## 2024-05-03 ENCOUNTER — Other Ambulatory Visit (HOSPITAL_BASED_OUTPATIENT_CLINIC_OR_DEPARTMENT_OTHER): Payer: Self-pay

## 2024-05-03 ENCOUNTER — Encounter: Payer: Self-pay | Admitting: Physician Assistant

## 2024-05-03 VITALS — BP 112/66 | HR 64 | Ht 65.0 in | Wt 125.0 lb

## 2024-05-03 DIAGNOSIS — Z800 Family history of malignant neoplasm of digestive organs: Secondary | ICD-10-CM | POA: Diagnosis not present

## 2024-05-03 DIAGNOSIS — E782 Mixed hyperlipidemia: Secondary | ICD-10-CM | POA: Diagnosis not present

## 2024-05-03 DIAGNOSIS — K449 Diaphragmatic hernia without obstruction or gangrene: Secondary | ICD-10-CM | POA: Insufficient documentation

## 2024-05-03 DIAGNOSIS — K2930 Chronic superficial gastritis without bleeding: Secondary | ICD-10-CM | POA: Insufficient documentation

## 2024-05-03 DIAGNOSIS — D126 Benign neoplasm of colon, unspecified: Secondary | ICD-10-CM | POA: Insufficient documentation

## 2024-05-03 MED ORDER — OMEPRAZOLE 40 MG PO CPDR
40.0000 mg | DELAYED_RELEASE_CAPSULE | Freq: Every day | ORAL | 1 refills | Status: DC
  Filled 2024-05-03: qty 90, 90d supply, fill #0

## 2024-05-03 NOTE — Progress Notes (Unsigned)
 c  Established Patient Office Visit  Subjective   Patient ID: Diane Gutierrez, female    DOB: 06-Jun-1966  Age: 58 y.o. MRN: [id number]  Chief Complaint  Patient presents with   Medical Management of Chronic Issues    HPI  {History (Optional):23778}  ROS    Objective:     There were no vitals taken for this visit. {Vitals History (Optional):23777}  Physical Exam   No results found for any visits on 05/03/24.  {Labs (Optional):23779}  The 10-year ASCVD risk score (Arnett DK, et al., 2019) is: 2.5%    Assessment & Plan:   Problem List Items Addressed This Visit       Unprioritized   Hyperlipidemia - Primary    No follow-ups on file.    Calahan Pak, PA-C

## 2024-05-03 NOTE — Patient Instructions (Signed)
 Omeprazole  40mg  twice a day sent to pharmacy.

## 2024-05-04 ENCOUNTER — Encounter: Payer: Self-pay | Admitting: Physician Assistant

## 2024-05-04 ENCOUNTER — Other Ambulatory Visit (HOSPITAL_BASED_OUTPATIENT_CLINIC_OR_DEPARTMENT_OTHER): Payer: Self-pay

## 2024-05-04 ENCOUNTER — Other Ambulatory Visit: Payer: Self-pay

## 2024-05-04 ENCOUNTER — Telehealth: Payer: Self-pay

## 2024-05-04 DIAGNOSIS — K2930 Chronic superficial gastritis without bleeding: Secondary | ICD-10-CM

## 2024-05-04 MED ORDER — OMEPRAZOLE 40 MG PO CPDR
40.0000 mg | DELAYED_RELEASE_CAPSULE | Freq: Every day | ORAL | 1 refills | Status: AC
  Filled 2024-05-04: qty 90, 90d supply, fill #0
  Filled 2024-11-08: qty 90, 90d supply, fill #1

## 2024-05-04 MED ORDER — OMEPRAZOLE 40 MG PO CPDR
40.0000 mg | DELAYED_RELEASE_CAPSULE | Freq: Two times a day (BID) | ORAL | 1 refills | Status: DC
  Filled 2024-05-04 (×2): qty 180, 90d supply, fill #0
  Filled ????-??-?? (×2): fill #0

## 2024-05-04 NOTE — Telephone Encounter (Signed)
 Resent as once daily to [facility] - spoke pharmacy to inform of change.

## 2024-05-04 NOTE — Telephone Encounter (Signed)
 Spoke with patient  - she states she was only taking 20mg  once per day previously ( and had been awhile since she had taken this ) - The GI doctor had recommended to increase to 40mg  once daily. She is requesting to start with increase as 20mg  BID if this is agreeable to you . She feels that an increase to 40mg  BID would be too large of an increase so soon.

## 2024-05-04 NOTE — Telephone Encounter (Signed)
 The correct dose is 40mg  twice a day. Thank you.

## 2024-05-04 NOTE — Telephone Encounter (Signed)
 Copied from CRM [id number]. Topic: Clinical - Prescription Issue >> May 04, 2024  8:32 AM Alfonso ORN wrote: Reason for CRM:  patient seen Vermell Bologna on 05/03/24 prescribed the  omeprazole  (PRILOSEC) 40 MG capsule 40 mg take one time a day , patient's  pharmacy received a  called this morning from the office to change the omeprazole  (PRILOSEC) 40 MG capsule  to take 40 mg  twice a day , one in morning 40 mg and one in the night 40 mg   [facility] [address] [place] [address] Phone: [phone] Fax: [phone] Hours: M-F 7:30am-6pm  Patient request if she can get a call back today to discuss this issues want to make sure its right   [phone]

## 2024-05-23 ENCOUNTER — Other Ambulatory Visit (HOSPITAL_BASED_OUTPATIENT_CLINIC_OR_DEPARTMENT_OTHER): Payer: Self-pay

## 2024-05-24 ENCOUNTER — Encounter: Admitting: Genetic Counselor

## 2024-05-24 ENCOUNTER — Other Ambulatory Visit

## 2024-05-25 DIAGNOSIS — M792 Neuralgia and neuritis, unspecified: Secondary | ICD-10-CM | POA: Diagnosis not present

## 2024-05-25 DIAGNOSIS — L600 Ingrowing nail: Secondary | ICD-10-CM | POA: Diagnosis not present

## 2024-05-26 ENCOUNTER — Ambulatory Visit: Admitting: Podiatry

## 2024-06-09 DIAGNOSIS — L600 Ingrowing nail: Secondary | ICD-10-CM | POA: Diagnosis not present

## 2024-06-24 ENCOUNTER — Ambulatory Visit: Payer: Self-pay
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
## 2024-06-28 ENCOUNTER — Ambulatory Visit: Payer: Medicare Other

## 2024-06-28 VITALS — Ht 65.0 in | Wt 123.0 lb

## 2024-06-28 DIAGNOSIS — Z0000 Encounter for general adult medical examination without abnormal findings: Secondary | ICD-10-CM

## 2024-06-28 NOTE — Progress Notes (Signed)
 Subjective:   Diane Gutierrez is a 58 y.o. female who presents for Medicare Annual (Subsequent) preventive examination.  Visit Complete: Virtual I connected with  Diane Gutierrez on 06/28/24 by a audio enabled telemedicine application and verified that I am speaking with the correct person using two identifiers.  Patient Location: Home  Provider Location: Office/Clinic  I discussed the limitations of evaluation and management by telemedicine. The patient expressed understanding and agreed to proceed.  Vital Signs: Because this visit was a virtual/telehealth visit, some criteria may be missing or patient reported. Any vitals not documented were not able to be obtained and vitals that have been documented are patient reported.  Patient Medicare AWV questionnaire was completed by the patient on n/a; I have confirmed that all information answered by patient is correct and no changes since this date.  Cardiac Risk Factors include: dyslipidemia;family history of premature cardiovascular disease     Objective:    Today's Vitals   06/28/24 1437 06/28/24 1438  Weight: 123 lb (55.8 kg)   Height: 5' 5 (1.651 m)   PainSc:  5    Body mass index is 20.47 kg/m.     06/28/2024    2:52 PM 04/07/2024    2:34 PM 06/23/2023    2:54 PM 05/30/2023    8:22 PM 11/26/2022    3:58 PM 08/26/2022    4:45 PM 07/30/2022    9:21 PM  Advanced Directives  Does Patient Have a Medical Advance Directive? No Yes No No No No No  Type of Furniture conservator/restorer;Living will       Would patient like information on creating a medical advance directive? No - Patient declined  No - Patient declined No - Patient declined  No - Patient declined     Current Medications (verified) Outpatient Encounter Medications as of 06/28/2024  Medication Sig   acetaminophen  (TYLENOL ) 325 MG tablet Take 650 mg by mouth as needed.   Ascorbic Acid (VITAMIN C) 100 MG tablet Take 500 mg by mouth  daily.   metoprolol  succinate (TOPROL -XL) 25 MG 24 hr tablet Take 1 tablet (25 mg total) by mouth daily with or immediately following a meal.   nitroGLYCERIN  (NITROSTAT ) 0.4 MG SL tablet Place 1 tablet (0.4 mg total) under the tongue every 5 (five) minutes as needed for chest pain.   omeprazole  (PRILOSEC) 40 MG capsule Take 1 capsule (40 mg total) by mouth daily.   promethazine  (PHENERGAN ) 12.5 MG tablet Take 1 tablet (12.5 mg total) by mouth every 6 (six) hours as needed for nausea or vomiting.   fluticasone  (FLONASE ) 50 MCG/ACT nasal spray Place 2 sprays into both nostrils daily. (Patient not taking: Reported on 06/28/2024)   No facility-administered encounter medications on file as of 06/28/2024.    Allergies (verified) Prednisone and Ibuprofen   History: Past Medical History:  Diagnosis Date   Abdominal pain    Acid reflux    Anxiety    Colicky RLQ abdominal pain 07/07/2019   Fibrocystic breast 07/10/2019   Hiatal hernia    Hyperlipidemia    Leukopenia    Low back pain    Lung nodule    Neck pain    Palpitations    Precordial pain 08/08/2021   Myoview  12/30/22: EF 69, no ischemia or infarction; low risk // TTE 12/30/22: EF 55-60, no RWMA, NL PASP, RAP 3   PVC (premature ventricular contraction) 06/22/2019   Monitor 01/2023: NSR, rare PACs and rare PVCs; single  brief run of PVCs; no sustained arrhythmias   Seasonal allergies    Seizure (HCC)    childhood   Sneezing    SOB (shortness of breath)    Echo 10/22: EF 60-65, no RWMA, normal RVSF, mild MR   Tachycardia    Tachycardia 07/07/2019   Umbilical hernia    Weight gain    Past Surgical History:  Procedure Laterality Date   BREAST BIOPSY Left    BREAST SURGERY     lumpectomy, cyst aspirated.on left   CHOLECYSTECTOMY     COLONOSCOPY W/ POLYPECTOMY     HYSTEROSCOPY DIAGNOSTIC     JOINT REPLACEMENT Left    knee x 2 with screws in place   KNEE SURGERY     NECK SURGERY     Dr Gust   ROBOTIC ASSISTED TOTAL  HYSTERECTOMY  2021   For CAH, with BSO, [facility]   Family History  Problem Relation Age of Onset   Arthritis Mother    Hearing loss Mother    Heart disease Mother        MI   Hyperlipidemia Mother    Thyroid  cancer Mother    Hypertension Father    Heart disease Father        s/p CABG   Diabetes Father    Hyperlipidemia Father    Other Father    Colon cancer Father    Cancer - Colon Father    Drug abuse Sister        crack cocaine   Other Sister    Diabetes Sister        smoker   Bipolar disorder Sister    Breast cancer Sister        Genetic testing, ? results   Heart disease Brother    Sleep apnea Brother    Heart disease Paternal Aunt    Arthritis Maternal Grandmother    Heart disease Maternal Grandmother    Arthritis Maternal Grandfather    Heart disease Maternal Grandfather    Heart disease Paternal Grandmother    Cervical cancer Paternal Grandmother    Cancer Paternal Grandfather    Heart disease Paternal Grandfather    Esophageal cancer Neg Hx    Rectal cancer Neg Hx    Stomach cancer Neg Hx    Social History   Socioeconomic History   Marital status: Married    Spouse name: John   Number of children: 3   Years of education: 12   Highest education level: 12th grade  Occupational History   Occupation: Disabled - due to neck injury  Tobacco Use   Smoking status: Never   Smokeless tobacco: Never  Vaping Use   Vaping status: Never Used  Substance and Sexual Activity   Alcohol use: Not Currently   Drug use: Never   Sexual activity: Not Currently  Other Topics Concern   Not on file  Social History Narrative   Lives with husband. She has two children and one step child. She enjoys reading, spending time with her dog and crafts.   Caffeine 1 cup to 2 cups daily   Are you right handed or left handed? Right   Are you currently employed ?    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   Who lives with you? With husband    What type of home do you live in: 1 story or 2 story? one       Social Drivers of  Health   Financial Resource Strain: Low Risk  (06/28/2024)   Overall Financial Resource Strain (CARDIA)    Difficulty of Paying Living Expenses: Not hard at all  Food Insecurity: No Food Insecurity (06/28/2024)   Hunger Vital Sign    Worried About Running Out of Food in the Last Year: Never true    Ran Out of Food in the Last Year: Never true  Transportation Needs: No Transportation Needs (06/28/2024)   PRAPARE - Administrator, Civil Service (Medical): No    Lack of Transportation (Non-Medical): No  Physical Activity: Sufficiently Active (06/28/2024)   Exercise Vital Sign    Days of Exercise per Week: 5 days    Minutes of Exercise per Session: 40 min  Stress: No Stress Concern Present (06/28/2024)   Diane Gutierrez [employer] Occupational Health - Occupational Stress Questionnaire    Feeling of Stress: Only a little  Social Connections: Moderately Integrated (06/28/2024)   Social Connection and Isolation Panel    Frequency of Communication with Friends and Family: More than three times a week    Frequency of Social Gatherings with Friends and Family: Once a week    Attends Religious Services: Never    Database administrator or Organizations: Yes    Attends Engineer, structural: More than 4 times per year    Marital Status: Married    Tobacco Counseling Counseling given: Not Answered   Clinical Intake:  Pre-visit preparation completed: Yes  Pain : 0-10 Pain Score: 5  Pain Type: Acute pain Pain Location: Head     BMI - recorded: 20.47 Nutritional Status: BMI of 19-24  Normal Nutritional Risks: None Diabetes: No  How often do you need to have someone help you when you read instructions, pamphlets, or other written materials from your doctor or pharmacy?: 1 - Never What is the last grade level you completed in school?: 12  Interpreter Needed?: No      Activities of Daily  Living    06/28/2024    2:39 PM  In your present state of health, do you have any difficulty performing the following activities:  Hearing? 0  Vision? 1  Difficulty concentrating or making decisions? 0  Walking or climbing stairs? 0  Dressing or bathing? 0  Doing errands, shopping? 0  Preparing Food and eating ? N  Using the Toilet? N  In the past six months, have you accidently leaked urine? Y  Do you have problems with loss of bowel control? N  Managing your Medications? N  Managing your Finances? N  Housekeeping or managing your Housekeeping? N    Patient Care Team: Breeback, Jade L, Diane Gutierrez as PCP - General (Family Medicine) Dann Candyce RAMAN, MD as PCP - Cardiology (Cardiology) Alona Kate HERO, MD (Obstetrics and Gynecology)  Indicate any recent Medical Services you may have received from other than [facility] providers in the past year (date may be approximate).     Assessment:   This is a routine wellness examination for Diane Gutierrez.  Hearing/Vision screen No results found.   Goals Addressed             This Visit's Progress    Patient Stated       Patient states she would like to have her feet fixed.        Depression Screen    06/28/2024    2:50 PM 06/23/2023    2:55 PM 05/13/2023    2:50 PM 03/31/2023    2:34 PM 09/30/2022  4:48 PM 08/26/2022    4:45 PM 12/13/2021    1:51 PM  PHQ 2/9 Scores  PHQ - 2 Score 0 0 0 0 0 0 1  PHQ- 9 Score      7     Fall Risk    06/28/2024    2:53 PM 04/07/2024    2:34 PM 06/23/2023    2:55 PM 05/20/2023    3:05 PM 05/13/2023    2:50 PM  Fall Risk   Falls in the past year? 0 0 0 0 0  Number falls in past yr: 0 0 0 0 0  Injury with Fall? 0 0 0 0 0  Risk for fall due to : No Fall Risks  No Fall Risks No Fall Risks No Fall Risks  Follow up Falls evaluation completed Falls evaluation completed Falls evaluation completed Falls prevention discussed;Falls evaluation completed Falls evaluation completed    MEDICARE RISK AT  HOME: Medicare Risk at Home Any stairs in or around the home?: Yes If so, are there any without handrails?: Yes Home free of loose throw rugs in walkways, pet beds, electrical cords, etc?: Yes Adequate lighting in your home to reduce risk of falls?: Yes Life alert?: No Use of a cane, walker or w/c?: No Grab bars in the bathroom?: No Shower chair or bench in shower?: No Elevated toilet seat or a handicapped toilet?: No  TIMED UP AND GO:  Was the test performed?  No    Cognitive Function:        06/28/2024    2:54 PM 06/23/2023    3:07 PM  6CIT Screen  What Year? 0 points 0 points  What month? 0 points 0 points  What time? 0 points 0 points  Count back from 20 0 points   Months in reverse 0 points   Repeat phrase 0 points   Total Score 0 points     Immunizations  There is no immunization history on file for this patient.  TDAP status: Due, Education has been provided regarding the importance of this vaccine. Advised may receive this vaccine at local pharmacy or Health Dept. Aware to provide a copy of the vaccination record if obtained from local pharmacy or Health Dept. Verbalized acceptance and understanding.  Flu Vaccine status: Declined, Education has been provided regarding the importance of this vaccine but patient still declined. Advised may receive this vaccine at local pharmacy or Health Dept. Aware to provide a copy of the vaccination record if obtained from local pharmacy or Health Dept. Verbalized acceptance and understanding.  Pneumococcal vaccine status: Declined,  Education has been provided regarding the importance of this vaccine but patient still declined. Advised may receive this vaccine at local pharmacy or Health Dept. Aware to provide a copy of the vaccination record if obtained from local pharmacy or Health Dept. Verbalized acceptance and understanding.   Covid-19 vaccine status: Declined, Education has been provided regarding the importance of this  vaccine but patient still declined. Advised may receive this vaccine at local pharmacy or Health Dept.or vaccine clinic. Aware to provide a copy of the vaccination record if obtained from local pharmacy or Health Dept. Verbalized acceptance and understanding.  Qualifies for Shingles Vaccine? Yes   Zostavax completed No   Shingrix Completed?: No.    Education has been provided regarding the importance of this vaccine. Patient has been advised to call insurance company to determine out of pocket expense if they have not yet received this vaccine. Advised may also receive  vaccine at local pharmacy or Health Dept. Verbalized acceptance and understanding.  Screening Tests Health Maintenance  Topic Date Due   COVID-19 Vaccine (1) Never done   HIV Screening  Never done   Hepatitis C Screening  Never done   DTaP/Tdap/Td (1 - Tdap) Never done   Hepatitis B Vaccines 19-59 Average Risk (1 of 3 - 19+ 3-dose series) Never done   Pneumococcal Vaccine: 50+ Years (1 of 1 - PCV) Never done   INFLUENZA VACCINE  06/10/2024   Medicare Annual Wellness (AWV)  06/28/2025   MAMMOGRAM  10/19/2025   Colonoscopy  04/19/2026   HPV VACCINES  Aged Out   Meningococcal B Vaccine  Aged Out   Zoster Vaccines- Shingrix  Discontinued    Health Maintenance  Health Maintenance Due  Topic Date Due   COVID-19 Vaccine (1) Never done   HIV Screening  Never done   Hepatitis C Screening  Never done   DTaP/Tdap/Td (1 - Tdap) Never done   Hepatitis B Vaccines 19-59 Average Risk (1 of 3 - 19+ 3-dose series) Never done   Pneumococcal Vaccine: 50+ Years (1 of 1 - PCV) Never done   INFLUENZA VACCINE  06/10/2024    Colorectal cancer screening: Type of screening: Colonoscopy. Completed 04/19/2024. Repeat every 1 years  Mammogram status: Completed 10/20/2023. Repeat every year   Lung Cancer Screening: (Low Dose CT Chest recommended if Age 92-80 years, 20 pack-year currently smoking OR have quit w/in 15years.) does not qualify.    Lung Cancer Screening Referral: n/a  Additional Screening:  Hepatitis C Screening: does qualify; Completed not yet  Vision Screening: Recommended annual ophthalmology exams for early detection of glaucoma and other disorders of the eye. Is the patient up to date with their annual eye exam?  No  Who is the provider or what is the name of the office in which the patient attends annual eye exams? Going to a new practice.  If pt is not established with a provider, would they like to be referred to a provider to establish care? no.   Dental Screening: Recommended annual dental exams for proper oral hygiene   Community Resource Referral / Chronic Care Management: CRR required this visit?  No   CCM required this visit?  No     Plan:     I have personally reviewed and noted the following in the patient's chart:   Medical and social history Use of alcohol, tobacco or illicit drugs  Current medications and supplements including opioid prescriptions. Patient is not currently taking opioid prescriptions. Functional ability and status Nutritional status Physical activity Advanced directives List of other physicians Hospitalizations, surgeries, and ER visits in previous 12 months. None Vitals Screenings to include cognitive, depression, and falls Referrals and appointments  In addition, I have reviewed and discussed with patient certain preventive protocols, quality metrics, and best practice recommendations. A written personalized care plan for preventive services as well as general preventive health recommendations were provided to patient.     Bonny Jon Mayor, CMA   06/28/2024   After Visit Summary: (MyChart) Due to this being a telephonic visit, the after visit summary with patients personalized plan was offered to patient via MyChart   Nurse Notes:   Diane Gutierrez is a 58 y.o. female patient of Diane Bologna, Diane Gutierrez who had a Medicare Annual Wellness Visit today  via telephone. Diane Gutierrez is Disabled and lives with their spouse. She has 3 children. She reports that she is socially active and does  interact with friends/family regularly. She is moderately physically active and enjoys reading, spending time with her dog and crafts.

## 2024-06-28 NOTE — Patient Instructions (Signed)
  Diane Gutierrez , Thank you for taking time to come for your Medicare Wellness Visit. I appreciate your ongoing commitment to your health goals. Please review the following plan we discussed and let me know if I can assist you in the future.   These are the goals we discussed:  Goals       Patient Stated      Drink more water      Patient Stated (pt-stated)      Patient stated that she would like to get her stomach straightened out.      Patient Stated      Patient states she would like to have her feet fixed.         This is a list of the screening recommended for you and due dates:  Health Maintenance  Topic Date Due   COVID-19 Vaccine (1) Never done   HIV Screening  Never done   Hepatitis C Screening  Never done   DTaP/Tdap/Td vaccine (1 - Tdap) Never done   Hepatitis B Vaccine (1 of 3 - 19+ 3-dose series) Never done   Pneumococcal Vaccine for age over 50 (1 of 1 - PCV) Never done   Flu Shot  06/10/2024   Medicare Annual Wellness Visit  06/28/2025   Mammogram  10/19/2025   Colon Cancer Screening  04/19/2026   HPV Vaccine  Aged Out   Meningitis B Vaccine  Aged Out   Zoster (Shingles) Vaccine  Discontinued

## 2024-07-05 ENCOUNTER — Ambulatory Visit: Admitting: Physician Assistant

## 2024-07-05 ENCOUNTER — Encounter: Payer: Self-pay | Admitting: Physician Assistant

## 2024-07-05 VITALS — BP 133/60 | HR 61 | Ht 65.0 in | Wt 123.0 lb

## 2024-07-05 DIAGNOSIS — K2950 Unspecified chronic gastritis without bleeding: Secondary | ICD-10-CM | POA: Diagnosis not present

## 2024-07-05 DIAGNOSIS — D126 Benign neoplasm of colon, unspecified: Secondary | ICD-10-CM | POA: Diagnosis not present

## 2024-07-05 DIAGNOSIS — R1031 Right lower quadrant pain: Secondary | ICD-10-CM | POA: Diagnosis not present

## 2024-07-05 DIAGNOSIS — Z800 Family history of malignant neoplasm of digestive organs: Secondary | ICD-10-CM

## 2024-07-05 DIAGNOSIS — R140 Abdominal distension (gaseous): Secondary | ICD-10-CM | POA: Diagnosis not present

## 2024-07-05 NOTE — Progress Notes (Unsigned)
   Established Patient Office Visit  Subjective   Patient ID: Diane Gutierrez, female    DOB: 20-Apr-1966  Age: 58 y.o. MRN: [id number]  No chief complaint on file.   HPI  {History (Optional):23778}  ROS    Objective:     There were no vitals taken for this visit. {Vitals History (Optional):23777}  Physical Exam   No results found for any visits on 07/05/24.  {Labs (Optional):23779}  The 10-year ASCVD risk score (Arnett DK, et al., 2019) is: 2.5%    Assessment & Plan:   Problem List Items Addressed This Visit   None   No follow-ups on file.    Diane Giebler, PA-C

## 2024-07-05 NOTE — Patient Instructions (Addendum)
 Consider fiber every morning.  Labs ordered today.  Start omeprazole  daily.  Get in with GI as soon as you can.

## 2024-07-06 ENCOUNTER — Ambulatory Visit: Payer: Self-pay | Admitting: Physician Assistant

## 2024-07-06 LAB — CMP14+EGFR
ALT: 15 IU/L (ref 0–32)
AST: 24 IU/L (ref 0–40)
Albumin: 4.7 g/dL (ref 3.8–4.9)
Alkaline Phosphatase: 80 IU/L (ref 44–121)
BUN/Creatinine Ratio: 16 (ref 9–23)
BUN: 12 mg/dL (ref 6–24)
Bilirubin Total: 0.6 mg/dL (ref 0.0–1.2)
CO2: 21 mmol/L (ref 20–29)
Calcium: 10.3 mg/dL — ABNORMAL HIGH (ref 8.7–10.2)
Chloride: 101 mmol/L (ref 96–106)
Creatinine, Ser: 0.77 mg/dL (ref 0.57–1.00)
Globulin, Total: 2.7 g/dL (ref 1.5–4.5)
Glucose: 83 mg/dL (ref 70–99)
Potassium: 5.2 mmol/L (ref 3.5–5.2)
Sodium: 141 mmol/L (ref 134–144)
Total Protein: 7.4 g/dL (ref 6.0–8.5)
eGFR: 89 mL/min/1.73 (ref >59)

## 2024-07-06 LAB — CBC WITH DIFFERENTIAL/PLATELET
Basophils Absolute: 0.1 x10E3/uL (ref 0.0–0.2)
Basos: 1 %
EOS (ABSOLUTE): 0.1 x10E3/uL (ref 0.0–0.4)
Eos: 1 %
Hematocrit: 43 % (ref 34.0–46.6)
Hemoglobin: 14.3 g/dL (ref 11.1–15.9)
Immature Grans (Abs): 0 x10E3/uL (ref 0.0–0.1)
Immature Granulocytes: 0 %
Lymphocytes Absolute: 2.6 x10E3/uL (ref 0.7–3.1)
Lymphs: 33 %
MCH: 30.3 pg (ref 26.6–33.0)
MCHC: 33.3 g/dL (ref 31.5–35.7)
MCV: 91 fL (ref 79–97)
Monocytes Absolute: 0.6 x10E3/uL (ref 0.1–0.9)
Monocytes: 7 %
Neutrophils Absolute: 4.5 x10E3/uL (ref 1.4–7.0)
Neutrophils: 58 %
Platelets: 273 x10E3/uL (ref 150–450)
RBC: 4.72 x10E6/uL (ref 3.77–5.28)
RDW: 12.5 % (ref 11.7–15.4)
WBC: 7.8 x10E3/uL (ref 3.4–10.8)

## 2024-07-06 LAB — TSH+FREE T4
Free T4: 1.27 ng/dL (ref 0.82–1.77)
TSH: 1.66 u[IU]/mL (ref 0.450–4.500)

## 2024-07-06 NOTE — Progress Notes (Signed)
 Kim,   Thyroid  looks great.  Kidney, glucose, and liver look good.  Normal WBC and hemoglobin.  Calcium up just a little. Are you taking extra calcium?

## 2024-07-08 ENCOUNTER — Encounter: Payer: Self-pay | Admitting: Physician Assistant

## 2024-07-12 ENCOUNTER — Telehealth: Payer: Self-pay | Admitting: Genetic Counselor

## 2024-07-12 NOTE — Telephone Encounter (Signed)
 Canceled appointments per patients request via incoming call. Talked with the patient and she is aware of the changes made to her upcoming appointment. Patient does not wish to reschedule her appointment at this time. She states she will call back when ready to reschedule.

## 2024-07-20 ENCOUNTER — Encounter: Admitting: Obstetrics and Gynecology

## 2024-07-29 ENCOUNTER — Ambulatory Visit: Admitting: Obstetrics and Gynecology

## 2024-08-05 ENCOUNTER — Other Ambulatory Visit: Payer: Self-pay | Admitting: Cardiovascular Disease

## 2024-08-05 ENCOUNTER — Other Ambulatory Visit (HOSPITAL_BASED_OUTPATIENT_CLINIC_OR_DEPARTMENT_OTHER): Payer: Self-pay

## 2024-08-08 ENCOUNTER — Other Ambulatory Visit (HOSPITAL_BASED_OUTPATIENT_CLINIC_OR_DEPARTMENT_OTHER): Payer: Self-pay

## 2024-08-08 MED ORDER — METOPROLOL SUCCINATE ER 25 MG PO TB24
25.0000 mg | ORAL_TABLET | Freq: Every day | ORAL | 3 refills | Status: AC
  Filled 2024-08-08: qty 90, 90d supply, fill #0
  Filled 2024-11-08: qty 90, 90d supply, fill #1

## 2024-08-11 ENCOUNTER — Other Ambulatory Visit

## 2024-08-11 ENCOUNTER — Encounter: Admitting: Genetic Counselor

## 2024-08-15 ENCOUNTER — Other Ambulatory Visit: Payer: Self-pay

## 2024-08-15 ENCOUNTER — Other Ambulatory Visit: Payer: Self-pay | Admitting: Physician Assistant

## 2024-08-16 ENCOUNTER — Other Ambulatory Visit: Payer: Self-pay | Admitting: Physician Assistant

## 2024-08-16 ENCOUNTER — Other Ambulatory Visit (HOSPITAL_BASED_OUTPATIENT_CLINIC_OR_DEPARTMENT_OTHER): Payer: Self-pay

## 2024-08-28 ENCOUNTER — Encounter: Payer: Self-pay | Admitting: Physician Assistant

## 2024-08-29 ENCOUNTER — Other Ambulatory Visit (HOSPITAL_BASED_OUTPATIENT_CLINIC_OR_DEPARTMENT_OTHER): Payer: Self-pay

## 2024-08-29 MED ORDER — FLUTICASONE PROPIONATE 50 MCG/ACT NA SUSP
2.0000 | Freq: Every day | NASAL | 6 refills | Status: AC
  Filled 2024-08-29: qty 16, 30d supply, fill #0

## 2024-09-01 DIAGNOSIS — K219 Gastro-esophageal reflux disease without esophagitis: Secondary | ICD-10-CM | POA: Diagnosis not present

## 2024-09-01 DIAGNOSIS — Z860101 Personal history of adenomatous and serrated colon polyps: Secondary | ICD-10-CM | POA: Diagnosis not present

## 2024-09-01 DIAGNOSIS — K222 Esophageal obstruction: Secondary | ICD-10-CM | POA: Diagnosis not present

## 2024-09-01 DIAGNOSIS — Z800 Family history of malignant neoplasm of digestive organs: Secondary | ICD-10-CM | POA: Diagnosis not present

## 2024-09-19 ENCOUNTER — Other Ambulatory Visit (HOSPITAL_BASED_OUTPATIENT_CLINIC_OR_DEPARTMENT_OTHER): Payer: Self-pay

## 2024-09-19 MED ORDER — AMOXICILLIN 875 MG PO TABS
875.0000 mg | ORAL_TABLET | Freq: Two times a day (BID) | ORAL | 0 refills | Status: DC
  Filled 2024-09-19: qty 30, 15d supply, fill #0

## 2024-09-19 MED ORDER — AMOXICILLIN 500 MG PO CAPS
500.0000 mg | ORAL_CAPSULE | Freq: Three times a day (TID) | ORAL | 0 refills | Status: DC
  Filled 2024-09-19: qty 23, 8d supply, fill #0

## 2024-09-20 ENCOUNTER — Other Ambulatory Visit (HOSPITAL_BASED_OUTPATIENT_CLINIC_OR_DEPARTMENT_OTHER): Payer: Self-pay

## 2024-09-26 NOTE — Progress Notes (Unsigned)
 Cardiology Office Note:    Date:  09/27/2024   ID:  Diane Gutierrez, DOB 1966/09/17, MRN [id number]  PCP:  Diane Gutierrez   [facility] Providers Cardiologist:  Candyce Reek, MD     Referring MD: Diane Gutierrez, [place]   Chief Complaint  Patient presents with   Palpitations   Follow-up    6 months    History of Present Illness:    Diane Gutierrez is a 58 y.o. female with a hx of:  Palpitations  Monitor 04/18/2021: NSR (HR 44-145), rare PACs, PVCs TTE 03/25/2018 ([facility]): EF 55-60, mild MR, mild TR, RVSP 21 TTE 08/28/2021: EF 60-65, no RWMA, normal RVSF, mild MR, RAP 3 TTE 12/30/2022: EF 55-60, no RWMA, normal RVSF, normal PASP, RAP 3 Monitor 01/2023: Rare PACs, rare PVCs, no sustained arrhythmias FHx of CAD CCTA 05/15/2020: CAC score 0, no CAD Exercise Myoview  12/30/2022: Max heart rate 162, no ischemia or infarction, EF 69, low risk Echocardiogram February 2024 showed normal LVEF and no significant valvular disease. GERD  Hyperlipidemia  Anxiety  Ulcerative colitis   The patient is here alone today. She's having a lot of symptoms. Currently having dental issues and having a dental extraction in the near future. She's having pain extending down into her neck. She complains of marked fatigue, chest pressure.  Describes central chest pain and pressure that occurs without physical exertion.  Relates this to emotional stress.  She has been under a lot of stress with a variety of things going on.  No shortness of breath, orthopnea, or PND.  Heart palpitations are intermittent.  States that sometimes her heart rate is in the 30s and other times it will jump up to 170 bpm.  No syncope reported.  She complains of mild edema in her lower legs and describes a sock line.      Current Medications: No outpatient medications have been marked as taking for the 09/27/24 encounter (Office Visit) with Wonda Sharper, MD.     Allergies:    Prednisone and Ibuprofen   ROS:   Please see the history of present illness.    All other systems reviewed and are negative.  EKGs/Labs/Other Studies Reviewed:    The following studies were reviewed today: Cardiac Studies & Procedures   ______________________________________________________________________________________________   STRESS TESTS  MYOCARDIAL PERFUSION IMAGING 12/30/2022  Interpretation Summary   The study is normal. The study is low risk.   Patient exercised according to the BRUCE protocol for 5:01 min achieving 7 METs   Target HR was achieved (max HR 162; 98% MPHR)   No ST deviation was noted.   LV perfusion is normal. There is no evidence of ischemia. There is no evidence of infarction.   Left ventricular function is normal. Nuclear stress EF: 69 %. The left ventricular ejection fraction is hyperdynamic (>65%). End diastolic cavity size is normal. End systolic cavity size is normal.   Prior study not available for comparison.   ECHOCARDIOGRAM  ECHOCARDIOGRAM COMPLETE 12/30/2022  Narrative ECHOCARDIOGRAM REPORT    Patient Name:   Diane Gutierrez Date of Exam: 12/30/2022 Medical Rec #:  [id number]             Height:       64.0 in Accession #:    [id number]            Weight:       118.8 lb Date of Birth:  05-13-1966  BSA:          1.568 m Patient Age:    56 years              BP:           126/88 mmHg Patient Gender: F                     HR:           74 bpm. Exam Location:  [address]  Procedure: 2D Echo, 3D Echo, Cardiac Doppler and Color Doppler  Indications:    R07.2 Chest Pain  History:        Patient has prior history of Echocardiogram examinations, most recent 08/28/2021. Signs/Symptoms:Shortness of Breath and Dizziness/Lightheadedness; Risk Factors:HLD.  Sonographer:    Waldo Guadalajara RCS Referring Phys: [id number] SCOTT T WEAVER  IMPRESSIONS   1. Left ventricular ejection fraction, by estimation, is 55 to 60%. The left  ventricle has normal function. The left ventricle has no regional wall motion abnormalities. Left ventricular diastolic parameters were normal. 2. Right ventricular systolic function is normal. The right ventricular size is normal. There is normal pulmonary artery systolic pressure. 3. The mitral valve is normal in structure. No evidence of mitral valve regurgitation. No evidence of mitral stenosis. 4. The aortic valve is normal in structure. Aortic valve regurgitation is not visualized. No aortic stenosis is present. 5. The inferior vena cava is normal in size with greater than 50% respiratory variability, suggesting right atrial pressure of 3 mmHg.  FINDINGS Left Ventricle: Left ventricular ejection fraction, by estimation, is 55 to 60%. The left ventricle has normal function. The left ventricle has no regional wall motion abnormalities. The left ventricular internal cavity size was normal in size. There is no left ventricular hypertrophy. Left ventricular diastolic parameters were normal.  Right Ventricle: The right ventricular size is normal. No increase in right ventricular wall thickness. Right ventricular systolic function is normal. There is normal pulmonary artery systolic pressure. The tricuspid regurgitant velocity is 2.00 m/s, and with an assumed right atrial pressure of 3 mmHg, the estimated right ventricular systolic pressure is 19.0 mmHg.  Left Atrium: Left atrial size was normal in size.  Right Atrium: Right atrial size was normal in size.  Pericardium: There is no evidence of pericardial effusion.  Mitral Valve: The mitral valve is normal in structure. No evidence of mitral valve regurgitation. No evidence of mitral valve stenosis.  Tricuspid Valve: The tricuspid valve is normal in structure. Tricuspid valve regurgitation is mild . No evidence of tricuspid stenosis.  Aortic Valve: The aortic valve is normal in structure. Aortic valve regurgitation is not visualized. No aortic  stenosis is present.  Pulmonic Valve: The pulmonic valve was normal in structure. Pulmonic valve regurgitation is not visualized. No evidence of pulmonic stenosis.  Aorta: The aortic root is normal in size and structure.  Venous: The inferior vena cava is normal in size with greater than 50% respiratory variability, suggesting right atrial pressure of 3 mmHg.  IAS/Shunts: No atrial level shunt detected by color flow Doppler.   LEFT VENTRICLE PLAX 2D LVIDd:         4.60 cm   Diastology LVIDs:         3.20 cm   LV e' medial:    6.96 cm/s LV PW:         0.80 cm   LV E/e' medial:  9.3 LV IVS:        0.60 cm  LV e' lateral:   13.30 cm/s LVOT diam:     1.70 cm   LV E/e' lateral: 4.8 LV SV:         40 LV SV Index:   25 LVOT Area:     2.27 cm  3D Volume EF: 3D EF:        57 % LV EDV:       87 ml LV ESV:       37 ml LV SV:        49 ml  RIGHT VENTRICLE RV Basal diam:  2.40 cm RV S prime:     15.00 cm/s TAPSE (M-mode): 1.8 cm RVSP:           19.0 mmHg  LEFT ATRIUM             Index        RIGHT ATRIUM           Index LA diam:        1.90 cm 1.21 cm/m   RA Pressure: 3.00 mmHg LA Vol (A2C):   21.7 ml 13.84 ml/m  RA Area:     7.74 cm LA Vol (A4C):   13.5 ml 8.61 ml/m   RA Volume:   13.60 ml  8.67 ml/m LA Biplane Vol: 18.4 ml 11.74 ml/m AORTIC VALVE LVOT Vmax:   89.60 cm/s LVOT Vmean:  62.400 cm/s LVOT VTI:    0.175 m  AORTA Ao Root diam: 3.10 cm Ao Asc diam:  2.90 cm  MITRAL VALVE               TRICUSPID VALVE MV Area (PHT):             TR Peak grad:   16.0 mmHg TR Vmax:        200.00 cm/s MV E velocity: 64.50 cm/s  Estimated RAP:  3.00 mmHg MV A velocity: 56.60 cm/s  RVSP:           19.0 mmHg MV E/A ratio:  1.14 SHUNTS Systemic VTI:  0.18 m Systemic Diam: 1.70 cm  Toribio Fuel MD Electronically signed by Toribio Fuel MD Signature Date/Time: 12/30/2022/10:10:18 AM    Final    MONITORS  CARDIAC EVENT MONITOR 12/09/2022  Narrative   Normal sinus  rhythm with rare PACs and rare PVCs.   Single, brief run of PVCs not associated with symptoms.   Symptoms correlated to sinus rhythm.   No sustained arrhythmias.   CT SCANS  CT CORONARY MORPH W/CTA COR W/SCORE 05/15/2020  Addendum 05/15/2020  6:01 PM ADDENDUM REPORT: 05/15/2020 17:58  HISTORY: 58 yo female with nonspecific chest pain  EXAM: Cardiac/Coronary CTA  TECHNIQUE: The patient was scanned on a [employer].  PROTOCOL: A 120 kV prospective scan was triggered in the descending thoracic aorta at 111 HU's. Axial non-contrast 3 mm slices were carried out through the heart. The data set was analyzed on a dedicated work station and scored using the Agatson method. Gantry rotation speed was 250 msecs and collimation was .6 mm. Beta blockade and 0.8 mg of sl NTG was given. The 3D data set was reconstructed in 5% intervals of the 67-82 % of the R-R cycle. Diastolic phases were analyzed on a dedicated work station using MPR, MIP and VRT modes. The patient received 80mL OMNIPAQUE  IOHEXOL  350 MG/ML SOLN of contrast.  FINDINGS: Quality: Good, HR 65  Coronary calcium score: The patient's coronary artery calcium score is 0, which places the patient in the 0  percentile.  Coronary arteries: Normal coronary origins.  Right dominance.  Right Coronary Artery: Dominant. Normal artery. Normal R-PLB and R-PDA branches.  Left Main Coronary Artery: Normal. Bifurcates into the LAD and LCx arteries.  Left Anterior Descending Coronary Artery: Large anterior vessel which wraps around the apex. Single high D1 branch. No disease.  Left Circumflex Artery: Large lateral artery without disease.  Aorta: Normal size, 31 mm at the mid ascending aorta (level of the PA bifurcation) measured double oblique. No calcifications. No dissection.  Aortic Valve: Trileaflet.  No calcifications.  Other findings:  Normal pulmonary vein drainage into the left atrium.  Normal left atrial  appendage without a thrombus.  Normal size of the pulmonary artery.  IMPRESSION: 1. No evidence of CAD, CADRADS = 0.  2. Coronary calcium score of 0. This was 0 percentile for age and sex matched control.  3. Normal coronary origin with right dominance.   Electronically Signed By: Vinie JAYSON Maxcy M.D. On: 05/15/2020 17:58  Narrative EXAM: OVER-READ INTERPRETATION  CT CHEST  The following report is an over-read performed by radiologist Dr. Toribio Aye of [facility] on 05/15/2020. This over-read does not include interpretation of cardiac or coronary anatomy or pathology. The coronary calcium score/coronary CTA interpretation by the cardiologist is attached.  COMPARISON:  None.  FINDINGS: Within the visualized portions of the thorax there are no suspicious appearing pulmonary nodules or masses, there is no acute consolidative airspace disease, no pleural effusions, no pneumothorax and no lymphadenopathy. Visualized portions of the upper abdomen demonstrates mild diffuse low attenuation throughout the visualized hepatic parenchyma, indicative of hepatic steatosis. Small hiatal hernia. There are no aggressive appearing lytic or blastic lesions noted in the visualized portions of the skeleton.  IMPRESSION: 1. Hepatic steatosis. 2. Small hiatal hernia.  Electronically Signed: By: Toribio Aye M.D. On: 05/15/2020 10:10     ______________________________________________________________________________________________      EKG:        Recent Labs: 07/05/2024: ALT 15; BUN 12; Creatinine, Ser 0.77; Hemoglobin 14.3; Platelets 273; Potassium 5.2; Sodium 141; TSH 1.660  Recent Lipid Panel    Component Value Date/Time   CHOL 199 12/11/2023 0000   TRIG 159 (H) 12/11/2023 0000   HDL 41 12/11/2023 0000   CHOLHDL 4.9 (H) 12/11/2023 0000   CHOLHDL 4.0 09/25/2022 0000   VLDL 26.8 08/20/2021 0840   LDLCALC 130 (H) 12/11/2023 0000   LDLCALC 131 (H)  09/25/2022 0000     Risk Assessment/Calculations:                Physical Exam:    VS:  BP 120/68 (BP Location: Left Arm, Patient Position: Sitting, Cuff Size: Normal)   Pulse 85   Resp 18   Ht 5' 5 (1.651 m)   Wt 122 lb (55.3 kg)   SpO2 97%   BMI 20.30 kg/m     Wt Readings from Last 3 Encounters:  09/27/24 122 lb (55.3 kg)  07/05/24 123 lb (55.8 kg)  06/28/24 123 lb (55.8 kg)     GEN:  Well nourished, well developed in no acute distress HEENT: Normal NECK: No JVD; No carotid bruits LYMPHATICS: No lymphadenopathy CARDIAC: RRR, no murmurs, rubs, gallops RESPIRATORY:  Clear to auscultation without rales, wheezing or rhonchi  ABDOMEN: Soft, non-tender, non-distended MUSCULOSKELETAL: Trace bilateral pretibial edema; No deformity  SKIN: Warm and dry NEUROLOGIC:  Alert and oriented x 3 PSYCHIATRIC:  Normal affect   Assessment & Plan Palpitations Continue metoprolol  succinate.  Check 3-day ZIO monitor.  Suspect stress related. Chest pain at rest Reviewed past testing.  She has had a normal nuclear scan in 2024 and a normal CT coronary angiogram in 2021 with a calcium score of 0 and no coronary artery disease.  Check 2D echocardiogram with her marked fatigue.  No ischemic testing indicated at this time.  Patient reassured about results of previous testing.            Medication Adjustments/Labs and Tests Ordered: Current medicines are reviewed at length with the patient today.  Concerns regarding medicines are outlined above.  Orders Placed This Encounter  Procedures   LONG TERM MONITOR (3-14 DAYS)   ECHOCARDIOGRAM COMPLETE   No orders of the defined types were placed in this encounter.   Patient Instructions  Medication Instructions:  No medication changes were made at this visit. Continue current regimen.   *If you need a refill on your cardiac medications before your next appointment, please call your pharmacy*  Lab Work: None ordered today. If you  have labs (blood work) drawn today and your tests are completely normal, you will receive your results only by: MyChart Message (if you have MyChart) OR A paper copy in the mail If you have any lab test that is abnormal or we need to change your treatment, we will call you to review the results.  Testing/Procedures: Your physician has requested that you wear a Zio heart monitor for 3 days. This will be mailed to your home with instructions on how to apply the monitor and how to return it when finished. Please allow 2 weeks after returning the heart monitor before our office calls you with the results.   Your physician has requested that you have an echocardiogram. Echocardiography is a painless test that uses sound waves to create images of your heart. It provides your doctor with information about the size and shape of your heart and how well your heart's chambers and valves are working. This procedure takes approximately one hour. There are no restrictions for this procedure. Please do NOT wear cologne, perfume, aftershave, or lotions (deodorant is allowed). Please arrive 15 minutes prior to your appointment time.  Please note: We ask at that you not bring children with you during ultrasound (echo/ vascular) testing. Due to room size and safety concerns, children are not allowed in the ultrasound rooms during exams. Our front office staff cannot provide observation of children in our lobby area while testing is being conducted. An adult accompanying a patient to their appointment will only be allowed in the ultrasound room at the discretion of the ultrasound technician under special circumstances. We apologize for any inconvenience.   Follow-Up: At [facility], you and your health needs are our priority.  As part of our continuing mission to provide you with exceptional heart care, our providers are all part of one team.  This team includes your primary Cardiologist (physician) and  Advanced Practice Providers or APPs (Physician Assistants and Nurse Practitioners) who all work together to provide you with the care you need, when you need it.  Your next appointment:   1 year(s)  Provider:   Ozell Fell, MD    We recommend signing up for the patient portal called MyChart.  Sign up information is provided on this After Visit Summary.  MyChart is used to connect with patients for Virtual Visits (Telemedicine).  Patients are able to view lab/test results, encounter notes, upcoming appointments, etc.  Non-urgent messages can be sent to your provider as  well.   To learn more about what you can do with MyChart, go to [email or web address].   Other Instructions ZIO XT- Long Term Monitor Instructions  Your physician has requested you wear a ZIO patch monitor for 3 days.   This is a single patch monitor. [employer] supplies one patch monitor per enrollment. Additional  stickers are not available. Please do not apply patch if you will be having a Nuclear Stress Test,  Echocardiogram, Cardiac CT, MRI, or Chest Xray during the period you would be wearing the  monitor. The patch cannot be worn during these tests. You cannot remove and re-apply the  ZIO XT patch monitor.   Your ZIO patch monitor will be mailed 3 day USPS to your address on file. It may take 3-5 days  to receive your monitor after you have been enrolled.   Once you have received your monitor, please review the enclosed instructions. Your monitor  has already been registered assigning a specific monitor serial # to you.     Billing and Patient Assistance Program Information  We have supplied [employer] with any of your insurance information on file for billing purposes.  [employer] offers a sliding scale Patient Assistance Program for patients that do not have  insurance, or whose insurance does not completely cover the cost of the ZIO monitor.  You must apply for the Patient Assistance Program to qualify for this  discounted rate.   To apply, please call [employer] at [phone], select option 4, select option 2, ask to apply for  Patient Assistance Program. Meredeth will ask your household income, and how many people  are in your household. They will quote your out-of-pocket cost based on that information.  [employer] will also be able to set up a 29-month, interest-free payment plan if needed.     Applying the monitor  Shave hair from upper left chest.  Hold abrader disc by orange tab. Rub abrader in 40 strokes over the upper left chest as  indicated in your monitor instructions.  Clean area with 4 enclosed alcohol pads. Let dry.  Apply patch as indicated in monitor instructions. Patch will be placed under collarbone on left  side of chest with arrow pointing upward.  Rub patch adhesive wings for 2 minutes. Remove white label marked 1. Remove the white  label marked 2. Rub patch adhesive wings for 2 additional minutes.  While looking in a mirror, press and release button in [facility] of patch. A small green light will  flash 3-4 times. This will be your only indicator that the monitor has been turned on.  Do not shower for the first 24 hours. You may shower after the first 24 hours.  Press the button if you feel a symptom. You will hear a small click. Record Date, Time and  Symptom in the Patient Logbook.  When you are ready to remove the patch, follow instructions on the last 2 pages of Patient  Logbook. Stick patch monitor onto the last page of Patient Logbook.   Place Patient Logbook in the blue and white box. Use locking tab on box and tape box closed  securely. The blue and white box has prepaid postage on it. Please place it in the mailbox as  soon as possible. Your physician should have your test results approximately 7 days after the  monitor has been mailed back to [facility].   Call [facility] Customer Care at [phone] if you have questions regarding  your ZIO XT patch  monitor.  Call them immediately if you see an orange light blinking on your  monitor.   If your monitor falls off in less than 4 days, contact our Monitor department at [phone].   If your monitor becomes loose or falls off after 4 days call [employer] at [phone] for  suggestions on securing your monitor.       Signed, Ozell Fell, MD  09/27/2024 9:12 AM    [facility]

## 2024-09-27 ENCOUNTER — Encounter: Payer: Self-pay | Admitting: Cardiovascular Disease

## 2024-09-27 ENCOUNTER — Ambulatory Visit: Attending: Cardiovascular Disease | Admitting: Cardiovascular Disease

## 2024-09-27 ENCOUNTER — Ambulatory Visit: Attending: Cardiovascular Disease

## 2024-09-27 VITALS — BP 120/68 | HR 85 | Resp 18 | Ht 65.0 in | Wt 122.0 lb

## 2024-09-27 DIAGNOSIS — R002 Palpitations: Secondary | ICD-10-CM

## 2024-09-27 DIAGNOSIS — R079 Chest pain, unspecified: Secondary | ICD-10-CM | POA: Diagnosis not present

## 2024-09-27 NOTE — Patient Instructions (Signed)
 Medication Instructions:  No medication changes were made at this visit. Continue current regimen.   *If you need a refill on your cardiac medications before your next appointment, please call your pharmacy*  Lab Work: None ordered today. If you have labs (blood work) drawn today and your tests are completely normal, you will receive your results only by: MyChart Message (if you have MyChart) OR A paper copy in the mail If you have any lab test that is abnormal or we need to change your treatment, we will call you to review the results.  Testing/Procedures: Your physician has requested that you wear a Zio heart monitor for 3 days. This will be mailed to your home with instructions on how to apply the monitor and how to return it when finished. Please allow 2 weeks after returning the heart monitor before our office calls you with the results.   Your physician has requested that you have an echocardiogram. Echocardiography is a painless test that uses sound waves to create images of your heart. It provides your doctor with information about the size and shape of your heart and how well your heart's chambers and valves are working. This procedure takes approximately one hour. There are no restrictions for this procedure. Please do NOT wear cologne, perfume, aftershave, or lotions (deodorant is allowed). Please arrive 15 minutes prior to your appointment time.  Please note: We ask at that you not bring children with you during ultrasound (echo/ vascular) testing. Due to room size and safety concerns, children are not allowed in the ultrasound rooms during exams. Our front office staff cannot provide observation of children in our lobby area while testing is being conducted. An adult accompanying a patient to their appointment will only be allowed in the ultrasound room at the discretion of the ultrasound technician under special circumstances. We apologize for any inconvenience.   Follow-Up: At  [facility], you and your health needs are our priority.  As part of our continuing mission to provide you with exceptional heart care, our providers are all part of one team.  This team includes your primary Cardiologist (physician) and Advanced Practice Providers or APPs (Physician Assistants and Nurse Practitioners) who all work together to provide you with the care you need, when you need it.  Your next appointment:   1 year(s)  Provider:   Ozell Fell, MD    We recommend signing up for the patient portal called MyChart.  Sign up information is provided on this After Visit Summary.  MyChart is used to connect with patients for Virtual Visits (Telemedicine).  Patients are able to view lab/test results, encounter notes, upcoming appointments, etc.  Non-urgent messages can be sent to your provider as well.   To learn more about what you can do with MyChart, go to [email or web address].   Other Instructions ZIO XT- Long Term Monitor Instructions  Your physician has requested you wear a ZIO patch monitor for 3 days.   This is a single patch monitor. [employer] supplies one patch monitor per enrollment. Additional  stickers are not available. Please do not apply patch if you will be having a Nuclear Stress Test,  Echocardiogram, Cardiac CT, MRI, or Chest Xray during the period you would be wearing the  monitor. The patch cannot be worn during these tests. You cannot remove and re-apply the  ZIO XT patch monitor.   Your ZIO patch monitor will be mailed 3 day USPS to your address on file. It may take 3-5  days  to receive your monitor after you have been enrolled.   Once you have received your monitor, please review the enclosed instructions. Your monitor  has already been registered assigning a specific monitor serial # to you.     Billing and Patient Assistance Program Information  We have supplied [employer] with any of your insurance information on file for billing purposes.   [employer] offers a sliding scale Patient Assistance Program for patients that do not have  insurance, or whose insurance does not completely cover the cost of the ZIO monitor.  You must apply for the Patient Assistance Program to qualify for this discounted rate.   To apply, please call [employer] at [phone], select option 4, select option 2, ask to apply for  Patient Assistance Program. Meredeth will ask your household income, and how many people  are in your household. They will quote your out-of-pocket cost based on that information.  [employer] will also be able to set up a 39-month, interest-free payment plan if needed.     Applying the monitor  Shave hair from upper left chest.  Hold abrader disc by orange tab. Rub abrader in 40 strokes over the upper left chest as  indicated in your monitor instructions.  Clean area with 4 enclosed alcohol pads. Let dry.  Apply patch as indicated in monitor instructions. Patch will be placed under collarbone on left  side of chest with arrow pointing upward.  Rub patch adhesive wings for 2 minutes. Remove white label marked 1. Remove the white  label marked 2. Rub patch adhesive wings for 2 additional minutes.  While looking in a mirror, press and release button in center of patch. A small green light will  flash 3-4 times. This will be your only indicator that the monitor has been turned on.  Do not shower for the first 24 hours. You may shower after the first 24 hours.  Press the button if you feel a symptom. You will hear a small click. Record Date, Time and  Symptom in the Patient Logbook.  When you are ready to remove the patch, follow instructions on the last 2 pages of Patient  Logbook. Stick patch monitor onto the last page of Patient Logbook.   Place Patient Logbook in the blue and white box. Use locking tab on box and tape box closed  securely. The blue and white box has prepaid postage on it. Please place it in the mailbox as  soon as  possible. Your physician should have your test results approximately 7 days after the  monitor has been mailed back to [facility].   Call [facility] Customer Care at [phone] if you have questions regarding  your ZIO XT patch monitor. Call them immediately if you see an orange light blinking on your  monitor.   If your monitor falls off in less than 4 days, contact our Monitor department at [phone].   If your monitor becomes loose or falls off after 4 days call [employer] at [phone] for  suggestions on securing your monitor.

## 2024-09-27 NOTE — Progress Notes (Unsigned)
 Enrolled for [employer] to mail a ZIO XT long term holter monitor to the patients address on file.

## 2024-09-30 ENCOUNTER — Telehealth: Payer: Self-pay

## 2024-09-30 NOTE — Telephone Encounter (Signed)
   Pre-operative Risk Assessment    Patient Name: Diane Gutierrez  DOB: [birth year] MRN: [id number]   Date of last office visit: 09/27/24 Date of next office visit: 11/17/24  Request for Surgical Clearance    Procedure:  Dental Extraction - Amount of Teeth to be Pulled:  1  Date of Surgery:  Clearance TBD                                Surgeon:  Donnice Mau, DDS Surgeon's Group or Practice Name:  [facility] Phone number:  [phone] Fax number:  [phone]   Type of Clearance Requested:   - Medical    Type of Anesthesia:  Local    Additional requests/questions:    [name]   09/30/2024, 5:05 PM

## 2024-10-03 NOTE — Telephone Encounter (Signed)
    Primary Cardiologist: Ozell Fell, MD  Chart reviewed as part of pre-operative protocol coverage. Simple dental extractions are considered low risk procedures per guidelines and generally do not require any specific cardiac clearance. It is also generally accepted that for simple extractions and dental cleanings, there is no need to interrupt blood thinner therapy.   SBE prophylaxis is not required for the patient.  I will route this recommendation to the requesting party via Epic fax function and remove from pre-op pool.  Please call with questions.  Lum LITTIE Louis, NP 10/03/2024, 9:42 AM

## 2024-10-07 ENCOUNTER — Other Ambulatory Visit (HOSPITAL_BASED_OUTPATIENT_CLINIC_OR_DEPARTMENT_OTHER): Payer: Self-pay

## 2024-10-11 ENCOUNTER — Ambulatory Visit: Admitting: Neurology

## 2024-10-11 NOTE — Telephone Encounter (Signed)
 Surgeons office received the Preop Clearance form and stats that does not state if the patient is cleared or not.  Will route to the preop app for review

## 2024-10-11 NOTE — Telephone Encounter (Signed)
 Office is calling back and received the form and it does not state if the patient is cleared or not. Please advise.

## 2024-10-12 ENCOUNTER — Ambulatory Visit: Admitting: Neurology

## 2024-10-12 NOTE — Telephone Encounter (Signed)
 Form reviewed and states guidelines do not require cardiac clearance for one tooth extraction. She does not require SBE PPX from a cardiovascular perspective.

## 2024-10-12 NOTE — Telephone Encounter (Signed)
 I will fax notes the DDS office to see notes from preop APP Jon Hails, PAC.

## 2024-10-17 ENCOUNTER — Ambulatory Visit: Admitting: Physician Assistant

## 2024-10-17 ENCOUNTER — Ambulatory Visit: Payer: Self-pay | Admitting: Cardiovascular Disease

## 2024-10-17 VITALS — BP 129/72 | HR 81 | Ht 65.0 in | Wt 130.0 lb

## 2024-10-17 DIAGNOSIS — R29898 Other symptoms and signs involving the musculoskeletal system: Secondary | ICD-10-CM

## 2024-10-17 DIAGNOSIS — R519 Headache, unspecified: Secondary | ICD-10-CM

## 2024-10-17 DIAGNOSIS — H539 Unspecified visual disturbance: Secondary | ICD-10-CM | POA: Diagnosis not present

## 2024-10-17 DIAGNOSIS — Z87820 Personal history of traumatic brain injury: Secondary | ICD-10-CM | POA: Diagnosis not present

## 2024-10-17 DIAGNOSIS — K0889 Other specified disorders of teeth and supporting structures: Secondary | ICD-10-CM | POA: Diagnosis not present

## 2024-10-17 DIAGNOSIS — R002 Palpitations: Secondary | ICD-10-CM

## 2024-10-17 DIAGNOSIS — R413 Other amnesia: Secondary | ICD-10-CM

## 2024-10-17 DIAGNOSIS — R42 Dizziness and giddiness: Secondary | ICD-10-CM

## 2024-10-17 NOTE — Patient Instructions (Signed)
 Will try to call and get sooner appt with Dr. Leigh, Neurology.  Get labs today.  Vitamin D  1000mg  daily.  Ordering MRI will have to have contrast.

## 2024-10-17 NOTE — Progress Notes (Signed)
 "  Acute Office Visit  Subjective:     Patient ID: Diane Gutierrez, female    DOB: [birth year], 58 y.o.   MRN: [id number]  Chief Complaint  Patient presents with   Dizziness    HPI .SABRADiscussed the use of AI scribe software for clinical note transcription with the patient, who gave verbal consent to proceed.  History of Present Illness Diane Gutierrez Diane Gutierrez is a 58 year old female who presents with worsening dizziness and headaches following a concussion.  Headache and neurological symptoms - Right-sided headaches that are tender and painful, worsening over the past three weeks - Associated dizziness, feeling faint, and hot flashes - Episodes of losing balance - Numbness in face, head, and lip - Confusion and forgetfulness - Light sensitivity exacerbates symptoms, with rapid eye movements when exposed to light - Difficulty focusing, blurred vision, and challenges with reading and puzzles - Symptoms intensified recently after being intermittent since concussion last year  Vestibular symptoms - Constant dizziness, worsened by eye movement and position changes - Losing balance and feeling faint  Visual disturbances - Light sensitivity with rapid eye movements when exposed to light - Difficulty focusing and blurred vision - Challenges with reading and puzzles  Cervicalgia - Chronic neck pain, not new - Tylenol  325 mg taken for pain relief, sometimes three tablets at a time, up to twice daily  Dental pain - Persistent tooth pain following an improperly performed root canal - Awaiting tooth extraction, delayed due to dizziness  Post-concussion history - Concussion last year after hitting head against a wall, resulting in skull indentation - Intermittent symptoms since concussion, with recent worsening  Other medical history - Colonoscopy in 2022 complicated by hematoma, followed by facial numbness  Family history and cardiac evaluation - Family history of heart  issues and Charcot-Marie-Tooth disease - Cardiology evaluation with heart monitor and echocardiogram in January - Concern for symptoms being related to concussion or hereditary condition    ROS See HPI.      Objective:    BP 129/72   Pulse 81   Ht 5' 5 (1.651 m)   Wt 130 lb (59 kg)   SpO2 99%   BMI 21.63 kg/m  BP Readings from Last 3 Encounters:  10/17/24 129/72  09/27/24 120/68  07/05/24 133/60   Wt Readings from Last 3 Encounters:  10/17/24 130 lb (59 kg)  09/27/24 122 lb (55.3 kg)  07/05/24 123 lb (55.8 kg)     Physical Exam Constitutional:      Appearance: Normal appearance.  HENT:     Head: Normocephalic.  Cardiovascular:     Rate and Rhythm: Normal rate and regular rhythm.  Pulmonary:     Effort: Pulmonary effort is normal.     Breath sounds: Normal breath sounds.  Neurological:     General: No focal deficit present.     Mental Status: She is alert and oriented to person, place, and time.  Psychiatric:        Mood and Affect: Mood normal.          Assessment & Plan:  SABRASABRAChristianne Gutierrez was seen today for dizziness.  Diagnoses and all orders for this visit:  Vertigo -     Vitamin B1 -     B12 and Folate Panel -     C-reactive protein -     Sed Rate (ESR) -     ANA,IFA RA Diag Pnl w/rflx Tit/Patn -     CBC w/Diff/Platelet -  BMP8+eGFR -     Cancel: MR Brain W Wo Contrast; Future -     FANA Staining Patterns -     Cancel: MR BRAIN W WO CONTRAST; Future -     MR BRAIN W WO CONTRAST; Future  Pain, dental  Vision changes -     Vitamin B1 -     B12 and Folate Panel -     C-reactive protein -     Sed Rate (ESR) -     ANA,IFA RA Diag Pnl w/rflx Tit/Patn -     CBC w/Diff/Platelet -     BMP8+eGFR -     Cancel: MR Brain W Wo Contrast; Future -     Cancel: MR BRAIN W WO CONTRAST; Future -     MR BRAIN W WO CONTRAST; Future  History of concussion -     Vitamin B1 -     B12 and Folate Panel -     C-reactive protein -     Sed Rate  (ESR) -     ANA,IFA RA Diag Pnl w/rflx Tit/Patn -     CBC w/Diff/Platelet -     BMP8+eGFR -     Cancel: MR Brain W Wo Contrast; Future -     Cancel: MR BRAIN W WO CONTRAST; Future -     MR BRAIN W WO CONTRAST; Future  Decreased grip strength of left hand -     Vitamin B1 -     B12 and Folate Panel -     C-reactive protein -     Sed Rate (ESR) -     ANA,IFA RA Diag Pnl w/rflx Tit/Patn -     CBC w/Diff/Platelet -     BMP8+eGFR -     Cancel: MR Brain W Wo Contrast; Future -     Cancel: MR BRAIN W WO CONTRAST; Future -     MR BRAIN W WO CONTRAST; Future  Decreased grip strength of right hand -     Vitamin B1 -     B12 and Folate Panel -     C-reactive protein -     Sed Rate (ESR) -     ANA,IFA RA Diag Pnl w/rflx Tit/Patn -     CBC w/Diff/Platelet -     BMP8+eGFR -     Cancel: MR Brain W Wo Contrast; Future -     Cancel: MR BRAIN W WO CONTRAST; Future -     MR BRAIN W WO CONTRAST; Future  Memory changes -     Vitamin B1 -     B12 and Folate Panel -     C-reactive protein -     Sed Rate (ESR) -     ANA,IFA RA Diag Pnl w/rflx Tit/Patn -     CBC w/Diff/Platelet -     BMP8+eGFR -     Cancel: MR Brain W Wo Contrast; Future -     Cancel: MR BRAIN W WO CONTRAST; Future -     MR BRAIN W WO CONTRAST; Future  Frequent headaches -     Vitamin B1 -     B12 and Folate Panel -     C-reactive protein -     Sed Rate (ESR) -     ANA,IFA RA Diag Pnl w/rflx Tit/Patn -     CBC w/Diff/Platelet -     BMP8+eGFR -     Cancel: MR Brain W Wo Contrast; Future -     Cancel: MR BRAIN  W WO CONTRAST; Future -     MR BRAIN W WO CONTRAST; Future    Assessment & Plan Right-sided headaches with dizziness and vertigo Chronic right-sided headaches with dizziness and vertigo, worsening over three weeks. Differential includes vestibular dysfunction, inner ear issues, and neurological causes. Previous concussion with residual symptoms. No POTS or orthostatic hypotension noted on today's work up.  Considered vestibular rehabilitation. - Ordered MRI of the brain without contrast to rule out mass or abnormalities. - Referred to neurologist Dr. Leigh for further evaluation and management. - Will consider vestibular rehabilitation if MRI is unremarkable.  Neck pain Chronic neck pain with no new exacerbations, possibly contributing to dizziness and balance issues. Previous evaluation by spinal and scoliosis specialist. - Will consider referral back to spinal and scoliosis specialist if symptoms persist.  Facial numbness Chronic left-sided facial numbness, possibly related to previous hematoma from colonoscopy. Symptoms may be exacerbated by neck issues or dental problems. - Continue to monitor symptoms and will reassess after dental evaluation and treatment.  Tooth pain due to failed root canal Chronic tooth pain from failed root canal with associated facial numbness. Pain persistent, contributing to discomfort. Dental evaluation and treatment pending. - Prescribed hydrocodone  for pain management as needed, particularly for upcoming dental procedure.    Lanson Randle, PA-C   "

## 2024-10-18 ENCOUNTER — Ambulatory Visit: Payer: Self-pay | Admitting: Physician Assistant

## 2024-10-18 NOTE — Progress Notes (Signed)
 Kim,   B12 on higher side. Cut back on any b12 supplements. At least by half.   Normal inflammatory markers.   Normal hemoglobin.   Normal kidney, liver, glucose.   Some labs still pending.

## 2024-10-19 ENCOUNTER — Telehealth: Payer: Self-pay

## 2024-10-19 ENCOUNTER — Ambulatory Visit: Admitting: Neurology

## 2024-10-19 ENCOUNTER — Other Ambulatory Visit (HOSPITAL_BASED_OUTPATIENT_CLINIC_OR_DEPARTMENT_OTHER): Payer: Self-pay

## 2024-10-19 DIAGNOSIS — Z98818 Other dental procedure status: Secondary | ICD-10-CM

## 2024-10-19 MED ORDER — DIAZEPAM 5 MG PO TABS
ORAL_TABLET | ORAL | 0 refills | Status: AC
  Filled 2024-10-19: qty 2, 1d supply, fill #0

## 2024-10-19 MED ORDER — HYDROCODONE-ACETAMINOPHEN 5-325 MG PO TABS
1.0000 | ORAL_TABLET | Freq: Four times a day (QID) | ORAL | 0 refills | Status: AC | PRN
  Filled 2024-10-19: qty 20, 5d supply, fill #0

## 2024-10-19 NOTE — Telephone Encounter (Signed)
 Copied from CRM #[id number]. Topic: General - Other >> Oct 19, 2024 10:53 AM Aleatha C wrote: Reason for CRM: Patient would like a call back in reference to her MRI from a nurse

## 2024-10-19 NOTE — Telephone Encounter (Signed)
 Letter of results sent to pt

## 2024-10-19 NOTE — Progress Notes (Signed)
 Diane Gutierrez,   Your ANA is positive. I think a rheumatology consult would be beneficial for you. Do you have any suggestion of where you might want to go?   Cut back on b12 at least by half. No high today.

## 2024-10-20 LAB — CBC WITH DIFFERENTIAL/PLATELET
Basophils Absolute: 0 x10E3/uL (ref 0.0–0.2)
Basos: 1 %
EOS (ABSOLUTE): 0.1 x10E3/uL (ref 0.0–0.4)
Eos: 2 %
Hematocrit: 41.3 % (ref 34.0–46.6)
Hemoglobin: 13.8 g/dL (ref 11.1–15.9)
Immature Grans (Abs): 0 x10E3/uL (ref 0.0–0.1)
Immature Granulocytes: 0 %
Lymphocytes Absolute: 2 x10E3/uL (ref 0.7–3.1)
Lymphs: 31 %
MCH: 30.8 pg (ref 26.6–33.0)
MCHC: 33.4 g/dL (ref 31.5–35.7)
MCV: 92 fL (ref 79–97)
Monocytes Absolute: 0.5 x10E3/uL (ref 0.1–0.9)
Monocytes: 7 %
Neutrophils Absolute: 3.9 x10E3/uL (ref 1.4–7.0)
Neutrophils: 59 %
Platelets: 267 x10E3/uL (ref 150–450)
RBC: 4.48 x10E6/uL (ref 3.77–5.28)
RDW: 12.1 % (ref 11.7–15.4)
WBC: 6.5 x10E3/uL (ref 3.4–10.8)

## 2024-10-20 LAB — BMP8+EGFR
BUN/Creatinine Ratio: 14 (ref 9–23)
BUN: 10 mg/dL (ref 6–24)
CO2: 23 mmol/L (ref 20–29)
Calcium: 10 mg/dL (ref 8.7–10.2)
Chloride: 102 mmol/L (ref 96–106)
Creatinine, Ser: 0.71 mg/dL (ref 0.57–1.00)
Glucose: 79 mg/dL (ref 70–99)
Potassium: 4.6 mmol/L (ref 3.5–5.2)
Sodium: 140 mmol/L (ref 134–144)
eGFR: 98 mL/min/1.73 (ref >59)

## 2024-10-20 LAB — C-REACTIVE PROTEIN: CRP: <1 mg/L (ref 0–10)

## 2024-10-20 LAB — ANA,IFA RA DIAG PNL W/RFLX TIT/PATN
ANA Titer 1: POSITIVE — AB
Cyclic Citrullin Peptide Ab: 22 U — AB (ref 0–19)
Rheumatoid fact SerPl-aCnc: 11.1 [IU]/mL (ref <14.0)

## 2024-10-20 LAB — FANA STAINING PATTERNS: CENTROMERE AB: 1:320 {titer} — ABNORMAL HIGH

## 2024-10-20 LAB — B12 AND FOLATE PANEL
Folate: >20 ng/mL (ref >3.0)
Vitamin B-12: 1479 pg/mL — ABNORMAL HIGH (ref 232–1245)

## 2024-10-20 LAB — SEDIMENTATION RATE: Sed Rate: 14 mm/h (ref 0–40)

## 2024-10-20 LAB — VITAMIN B1: Thiamine: 113.4 nmol/L (ref 66.5–200.0)

## 2024-10-27 LAB — HM MAMMOGRAPHY

## 2024-10-28 ENCOUNTER — Encounter: Payer: Self-pay | Admitting: Physician Assistant

## 2024-10-31 ENCOUNTER — Other Ambulatory Visit

## 2024-11-01 ENCOUNTER — Telehealth: Payer: Self-pay

## 2024-11-01 ENCOUNTER — Other Ambulatory Visit (HOSPITAL_COMMUNITY)

## 2024-11-01 NOTE — Telephone Encounter (Signed)
 Copied from CRM #[id number]. Topic: Clinical - Request for Lab/Test Order >> Nov 01, 2024 11:10 AM Myrick T wrote: Reason for CRM: patient called stated she has to have an MRI under sedation and the orders need to be changed. Please f/u with patient as she wants to give the nurse the details

## 2024-11-08 ENCOUNTER — Encounter: Payer: Self-pay | Admitting: Physician Assistant

## 2024-11-09 NOTE — Telephone Encounter (Signed)
 Spoke with patient . States she already has the medication but states this does not help the dizziness with laying down flat and has numbness on left head - states she almost vomited in MRI machine with the attempt on Monday 11/07/2024.  Patient  states she can have this done under anesthesia but will need to be ordered STAT to be completed before May 2026.  Patient is fine to wait to have [facility] review this on her return   She also wanted to let [facility]  know- states she is scheduled for a breast biopsy - states that there is a cluster of sites seen but they are only going to biopsy one site   She is going to send a list of names of Rheumatologist she would prefer through Mychart to [place] as well.

## 2024-11-11 ENCOUNTER — Encounter: Payer: Self-pay | Admitting: Physician Assistant

## 2024-11-11 NOTE — Telephone Encounter (Signed)
 Order changed.

## 2024-11-14 ENCOUNTER — Other Ambulatory Visit (HOSPITAL_BASED_OUTPATIENT_CLINIC_OR_DEPARTMENT_OTHER): Payer: Self-pay

## 2024-11-14 MED ORDER — AMOXICILLIN 500 MG PO CAPS
ORAL_CAPSULE | ORAL | 0 refills | Status: AC
  Filled 2024-11-14: qty 22, 7d supply, fill #0

## 2024-11-14 NOTE — Telephone Encounter (Signed)
 Patient has been advised in separate encounter by teammate Suzen Plenty.

## 2024-11-17 ENCOUNTER — Ambulatory Visit (HOSPITAL_COMMUNITY)

## 2024-11-24 NOTE — Telephone Encounter (Signed)
 Copied from CRM #[id number]. Topic: Clinical - Request for Lab/Test Order >> Nov 01, 2024 11:10 AM Myrick T wrote: Reason for CRM: patient called stated she has to have an MRI under sedation and the orders need to be changed. Please f/u with patient as she wants to give the nurse the details >> Nov 24, 2024  2:17 PM Winona R wrote: Pt states the orders have been put in for her MRI but it doesn't say STAT. She is requesting that STAT is placed on the orders to get it down quickly and effectively with the sedation.

## 2024-11-24 NOTE — Telephone Encounter (Signed)
 Tried to encourage patient to reach out to neurology and get scheduled. She states she is going to reach out to them today.

## 2024-11-24 NOTE — Addendum Note (Signed)
 Addended byBETHA DUWAINE RIGGS on: 11/24/2024 02:55 PM   Modules accepted: Orders

## 2024-11-25 NOTE — Progress Notes (Unsigned)
 "  I saw Diane Gutierrez in neurology clinic on 11/30/24 in follow up for numbness, tingling dizziness, headache.  HPI: Diane Gutierrez is a 59 y.o. 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  To briefly review: 05/20/23: Patient first noticed symptoms for about 3 years. She first noticed numbness and tingling in her feet. She experiences shock sensations, now including her hands, especially over the last year. The symptoms have progressed since onset. She has difficulty clearing her right foot when walking. She has almost fallen several times, but not had a fall. Patient endorses muscle loss in lower legs. She endorses high arches.   Patient has chronic dizziness that she has attributed to her neck. She can feel like she is rocking when sitting. She has neck pain and turning her head can cause dizziness or her to pass out. She can have blurry vision.   She endorses cramps and twitching.   Patient is concerned about CMT due to grandmother and great grandfather having CMT. Grandmother had symptoms in her 30s and had drop foot. She also has friends with MS and is concerned about this.   She was seen in 2022 and 2023 by [facility] for headaches and dizziness and mentioned CMT, but  didn't seem like much was done. She never had EMG. MRI of her brain that was unremarkable.   She does not report any constitutional symptoms like fever or unintentional weight loss. She has night sweats requiring her to change her sheets or clothes. She wonders if this is hormone related.   She also mentions feeling her skull has changed with more indents then prior.   She also notes that she has had some left sided weakness that is chronic since her neck surgery.   EtOH use: None  Restrictive diet? Does not eat a lot of meat  04/07/24: Patient continues to have numbness and tingling in her legs and hands. This  is the same as prior. She drops things occasionally. I saw her last year for this but she did not get her labs drawn. EMG was not scheduled. She states this was due to other family issues.   She now endorses dizziness. She feels like she is vibrating. She can have occasional pain on the left top of her head that is dull. She also mentions blurry vision. She is going to see an eye doctor but could not get an appointment until 10/2024.   She has fluctuations in her weight due to nausea. GI is currently working this up.  Most recent Assessment and Plan (04/07/24): This is Diane Gutierrez, a 60 y.o. female with: Numbness and tingling in bilateral legs and hands - patient was previously concerned about MS or CMT. MRI does not support MS. Exam does not show a clear neuropathy, but I attempted to work up symptoms last year but she did not get labs or schedule EMG. Her examination is still essentially normal with give way weakness suggesting potential functional etiology. There may also be contributions from prior cervical spine disease. Headache, blurry vision, and dizziness - previously seen by neurology for these issues in 2022 and 2023. MRI brain and cervical spine normal. I will check labs to look for treatable causes.   Plan: -Blood work: B1, B12, IFE, ESR, CRP, vit E, copper  -Discussed EMG - patient agreed to defer for now -Fall precautions discussed  Since their last visit:  Labs showed her B1 was low. I recommended B1 100 mg daily. She is taking this and repeat B1 on 10/17/24 was normal. She is still taking this.  She has numbness on the left side of her head. If she tries to lay flat, she will get vertigo. She has to sleep sitting up. She has an MRI of her brain scheduled on 04/13/25 ordered by PCP due to this symptoms and patient reporting confusion. She feels vertigo when she turns her head.  She has had dizziness since at least 2022 when she was seen at [facility]. MRI brain at that time was  normal.  She states she has headaches most days. She takes tylenol  for those (takes it daily). She describes the headache as sharp on top of her head. She has photophobia, phonophobia, nausea, and blurry vision associated. She also has neck pain.  She has not had any recent physical therapy.  Patient is also having a biopsy on her breast due to a concern in the left breast.   MEDICATIONS:  Outpatient Encounter Medications as of 11/30/2024  Medication Sig   acetaminophen  (TYLENOL ) 325 MG tablet Take 650 mg by mouth as needed.   amoxicillin  (AMOXIL ) 500 MG capsule Take 2 capsules (1000 mg) by mouth now, then take 1 capsule (500 mg) 3 times daily until all taken.   Ascorbic Acid (VITAMIN C) 100 MG tablet Take 500 mg by mouth daily.   diazepam  (VALIUM ) 5 MG tablet Take 1 tab by mouth 1 hour before procedure or imaging.   fluticasone  (FLONASE ) 50 MCG/ACT nasal spray Place 2 sprays into both nostrils daily.   metoprolol  succinate (TOPROL -XL) 25 MG 24 hr tablet Take 1 tablet (25 mg total) by mouth daily with or immediately following a meal.   nitroGLYCERIN  (NITROSTAT ) 0.4 MG SL tablet Place 1 tablet (0.4 mg total) under the tongue every 5 (five) minutes as needed for chest pain.   omeprazole  (PRILOSEC) 40 MG capsule Take 1 capsule (40 mg total) by mouth daily.   promethazine  (PHENERGAN ) 12.5 MG tablet Take 1 tablet (12.5 mg total) by mouth every 6 (six) hours as needed for nausea or vomiting.   No facility-administered encounter medications on file as of 11/30/2024.    PAST MEDICAL HISTORY: Past Medical History:  Diagnosis Date   Abdominal pain    Acid reflux    Anxiety    Colicky RLQ abdominal pain 07/07/2019   Fibrocystic breast 07/10/2019   Hiatal hernia    Hyperlipidemia    Leukopenia    Low back pain    Lung nodule    Neck pain    Palpitations    Precordial pain 08/08/2021   Myoview  12/30/22: EF 69, no ischemia or infarction; low risk // TTE 12/30/22: EF 55-60, no RWMA, NL PASP,  RAP 3   PVC (premature ventricular contraction) 06/22/2019   Monitor 01/2023: NSR, rare PACs and rare PVCs; single brief run of PVCs; no sustained arrhythmias   Seasonal allergies    Seizure (HCC)    childhood   Sneezing    SOB (shortness of breath)    Echo 10/22: EF 60-65, no RWMA, normal RVSF, mild MR   Tachycardia    Tachycardia 07/07/2019   Umbilical hernia    Weight gain     PAST SURGICAL HISTORY: Past Surgical History:  Procedure Laterality Date   BREAST BIOPSY Left    BREAST SURGERY     lumpectomy, cyst aspirated.on left   CHOLECYSTECTOMY     COLONOSCOPY W/ POLYPECTOMY  HYSTEROSCOPY DIAGNOSTIC     JOINT REPLACEMENT Left    knee x 2 with screws in place   KNEE SURGERY     NECK SURGERY     Dr Gust   ROBOTIC ASSISTED TOTAL HYSTERECTOMY  2021   For CAH, with BSO, [facility]    ALLERGIES: Allergies[1]  FAMILY HISTORY: Family History  Problem Relation Age of Onset   Arthritis Mother    Hearing loss Mother    Heart disease Mother        MI   Hyperlipidemia Mother    Thyroid  cancer Mother    Hypertension Father    Heart disease Father        s/p CABG   Diabetes Father    Hyperlipidemia Father    Other Father    Colon cancer Father    Cancer - Colon Father    Drug abuse Sister        crack cocaine   Other Sister    Diabetes Sister        smoker   Bipolar disorder Sister    Breast cancer Sister        Genetic testing, ? results   Heart disease Brother    Sleep apnea Brother    Heart disease Paternal Aunt    Arthritis Maternal Grandmother    Heart disease Maternal Grandmother    Arthritis Maternal Grandfather    Heart disease Maternal Grandfather    Heart disease Paternal Grandmother    Cervical cancer Paternal Grandmother    Cancer Paternal Grandfather    Heart disease Paternal Grandfather    Esophageal cancer Neg Hx    Rectal cancer Neg Hx    Stomach cancer Neg Hx     SOCIAL HISTORY: Social History[2] Social History   Social History  Narrative   Lives with husband. She has two children and one step child. She enjoys reading, spending time with her dog and crafts.   Caffeine 1 cup to 2 cups daily   Are you right handed or left handed? Right   Are you currently employed ?    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   Who lives with you? With husband   What type of home do you live in: 1 story or 2 story? one        Objective:  Vital Signs:  BP 114/66   Pulse 100   Ht 5' 5 (1.651 m)   Wt 128 lb (58.1 kg)   SpO2 100%   BMI 21.30 kg/m   General: No acute distress.  Patient appears well-groomed.   Head:  Normocephalic/atraumatic Eyes:  fundi examined but not visualized Neck: supple, Heart: regular rate and rhythm Lungs: Clear to auscultation bilaterally. Vascular: No carotid bruits.  Neurological Exam: Mental status: alert and oriented, speech fluent and not dysarthric, language intact.  Cranial nerves: CN I: not tested CN II: pupils equal, round and reactive to light, visual fields intact CN III, IV, VI:  full range of motion, no nystagmus, no ptosis. Patient does not keep eyes open during testing. CN V: facial sensation intact. CN VII: upper and lower face symmetric CN VIII: hearing intact CN IX, X: uvula midline CN XI: sternocleidomastoid and trapezius muscles intact CN XII: tongue midline  Bulk & Tone: normal, no fasciculations. Motor:  muscle strength 5/5 throughout (give way weakness in LLE) Deep Tendon Reflexes:  2+ throughout.   Sensation:  Pinprick sensation intact. Finger to  nose testing:  Without dysmetria.   RAM movements: normal.   Gait:  Normal station and stride. Slow.   Lab and Test Review: New results: 10/17/24: ANA positive CCP 22 ESR wnl CRP wnl B12: 1479 Folate wnl B1 wnl  07/05/24: CBC w/ diff unremarkable CMP unremarkable TSH wnl  04/07/24: Copper  wnl Vit D wnl IFE: no M protein B12: 1282 B1: low at 7  Previously reviewed  results: 12/11/23: CBC unremarkable BMP unremarkable Lipid panel: tChol 199, LDL 130, TG 159  TSH (11/26/22): 0.946   Imaging: CT head wo contrast (06/07/21): FINDINGS: Brain: No acute intracranial abnormality. Specifically, no hemorrhage, hydrocephalus, mass lesion, acute infarction, or significant intracranial injury.   Vascular: No hyperdense vessel or unexpected calcification.   Skull: No acute calvarial abnormality.   Sinuses/Orbits: No acute findings   Other: None   IMPRESSION: No acute intracranial abnormality.   Echo (12/30/22): Normal   MRI brain wo contrast (11/05/21): FINDINGS:  Mildly motion limited study.   Brain: No acute infarction, hemorrhage, hydrocephalus, extra-axial  collection or mass lesion.   Vascular: Major arterial flow voids are maintained at the skull  base.   Skull and upper cervical spine: Normal marrow signal.   Sinuses/Orbits: Very mild paranasal sinus mucosal thickening.  Unremarkable orbits.   Other: No mastoid effusions.   IMPRESSION:  Unremarkable brain MRI.  No acute abnormality.    MRI cervical spine wo contrast (11/06/21): FINDINGS:  Alignment: Straightening of the normal cervical lordosis without  substantial sagittal subluxation.   Vertebrae: Vertebral body heights are maintained. No specific  evidence of acute fracture or discitis/osteomyelitis. No suspicious  bone lesions.   Cord: Normal cord signal.   Posterior Fossa, vertebral arteries, paraspinal tissues: Visualized  vertebral artery flow voids are maintained. Unremarkable visualized  posterior fossa. No appreciable paraspinal edema.   Disc levels:   C2-C3: Mild posterior disc osteophyte complex and facet arthropathy  without significant canal or foraminal stenosis.   C3-C4: Small posterior disc osteophyte complex and bilateral  facet/uncovertebral hypertrophy. Resulting moderate right greater  than left foraminal stenosis, mildly progressed from prior. No   significant canal stenosis.   C4-C5: Small posterior disc osteophyte complex and right greater  than left facet/uncovertebral hypertrophy. Resulting moderate right  and mild left foraminal stenosis. Mild canal stenosis, similar.   C5-C6: ACDF with patent canal and foramina.  Solid bony fusion.   C6-C7: ACDF with patent canal and foramina.  Solid bony fusion.   C7-T1: Mild posterior disc osteophyte complex and right greater than  left facet/uncovertebral hypertrophy. No impingement.   IMPRESSION:  1. 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 Similar mild canal stenosis at C4-C5.  2. C5-C6 and C6-C7 ACDF with solid bony fusion.   ASSESSMENT: This is Diane Gutierrez, a 59 y.o. female with: Daily headaches with photophobia, phonophobia, nausea, left facial paresthesia. May be migraines with vestibular component. Facial pain she thinks could also be related to TMJ. MRI brain has been ordered. While this is reasonable to check, patient is requiring sedation to get due to dizziness and MRI brain in 2022 when patient was seen at [facility] for dizziness was normal, so it may be less likely that symptoms are CNS issue. Dizziness/vertigo - when laying down, constant until she sits up. Perhaps related to #1 above, though could be inner ear pathology. Less likely CNS as MRI brain was normal in 2022 when she was dizzy.   Plan: -Vestibular rehab -For potential  vestibular migraine: Migraine prevention: Nortriptyline  10 mg at bedtime for 1 week, then increase to 20 mg at bedtime thereafter Migraine rescue: patient prefers to continue tylenol  as needed Limit use of pain relievers to no more than 2 days out of week to prevent risk of rebound or medication-overuse headache. Keep headache diary  -Will consider ENT referral if above does not help -Will follow up MRI brain (though MRI brain in 2022 while patient was dizzy was normal)  Return to  clinic in 6 months  Total time spent reviewing records, interview, history/exam, documentation, and coordination of care on day of encounter:  45 min  Venetia Potters, MD     [1]  Allergies Allergen Reactions   Prednisone Rash and Hives    RASH, severe  Whelps and skin feels like it is on fire   Ibuprofen     GI UPSET  Other Reaction(s): GI Intolerance  [2]  Social History Tobacco Use   Smoking status: Never   Smokeless tobacco: Never  Vaping Use   Vaping status: Never Used  Substance Use Topics   Alcohol use: Not Currently   Drug use: Never   "

## 2024-11-30 ENCOUNTER — Encounter: Payer: Self-pay | Admitting: Neurology

## 2024-11-30 ENCOUNTER — Encounter: Payer: Self-pay | Admitting: Physician Assistant

## 2024-11-30 ENCOUNTER — Ambulatory Visit: Payer: Self-pay | Admitting: Neurology

## 2024-11-30 ENCOUNTER — Other Ambulatory Visit (HOSPITAL_BASED_OUTPATIENT_CLINIC_OR_DEPARTMENT_OTHER): Payer: Self-pay

## 2024-11-30 VITALS — BP 114/66 | HR 100 | Ht 65.0 in | Wt 128.0 lb

## 2024-11-30 DIAGNOSIS — R7681 Abnormal rheumatoid factor and anti-citrullinated protein antibody without rheumatoid arthritis: Secondary | ICD-10-CM | POA: Insufficient documentation

## 2024-11-30 DIAGNOSIS — G43001 Migraine without aura, not intractable, with status migrainosus: Secondary | ICD-10-CM | POA: Diagnosis not present

## 2024-11-30 DIAGNOSIS — R7689 Other specified abnormal immunological findings in serum: Secondary | ICD-10-CM | POA: Insufficient documentation

## 2024-11-30 MED ORDER — NORTRIPTYLINE HCL 10 MG PO CAPS
20.0000 mg | ORAL_CAPSULE | Freq: Every day | ORAL | 11 refills | Status: AC
  Filled 2024-11-30: qty 60, 30d supply, fill #0

## 2024-11-30 NOTE — Patient Instructions (Signed)
-  Vestibular rehab  -For potential vestibular migraine: Migraine prevention: Nortriptyline  10 mg at bedtime for 1 week, then increase to 20 mg at bedtime thereafter Migraine rescue: continue tylenol  as needed Limit use of pain relievers to no more than 2 days out of week to prevent risk of rebound or medication-overuse headache. Keep headache diary  -Will consider ENT referral if above does not help -Will follow up MRI brain  Return to clinic in 6 months  The physicians and staff at [facility] are committed to providing excellent care. You may receive a survey requesting feedback about your experience at our office. We strive to receive very good responses to the survey questions. If you feel that your experience would prevent you from giving the office a very good  response, please contact our office to try to remedy the situation. We may be reached at [phone]. Thank you for taking the time out of your busy day to complete the survey.  Venetia Potters, MD [facility]

## 2024-11-30 NOTE — Telephone Encounter (Signed)
"  Pended referral.  "

## 2024-11-30 NOTE — Telephone Encounter (Signed)
 Referral has been placed.

## 2024-12-01 ENCOUNTER — Other Ambulatory Visit: Payer: Self-pay

## 2024-12-01 NOTE — Addendum Note (Signed)
 Addended by: TERRIL CHARLIES MATSU on: 12/01/2024 01:03 PM   Modules accepted: Orders

## 2024-12-02 LAB — SURGICAL PATHOLOGY

## 2024-12-07 ENCOUNTER — Telehealth: Payer: Self-pay | Admitting: Physician Assistant

## 2024-12-07 NOTE — Telephone Encounter (Signed)
 Copied from CRM #[id number]. Topic: Referral - Status >> Dec 07, 2024  3:23 PM Diane Gutierrez wrote: Reason for CRM: GI referral needs an authorization code from her PCP according to her insurance.  Fax: [phone]  Has an appt in March, she needs this to set up her colonoscopy. Says please contact her if you have any questions.

## 2025-04-13 ENCOUNTER — Other Ambulatory Visit (HOSPITAL_COMMUNITY)

## 2025-04-13 ENCOUNTER — Ambulatory Visit (HOSPITAL_COMMUNITY): Admit: 2025-04-13

## 2025-04-13 SURGERY — MRI WITH ANESTHESIA
Anesthesia: General

## 2025-06-16 ENCOUNTER — Ambulatory Visit: Payer: Self-pay | Admitting: Neurology

## 2025-06-29 ENCOUNTER — Ambulatory Visit
